# Patient Record
Sex: Female | Born: 1935 | Race: White | Hispanic: No | Marital: Single | Smoking: Never smoker
Health system: Southern US, Community
[De-identification: ages and names within clinical notes are randomized; demographics above are authoritative.]

## PROBLEM LIST (undated history)

## (undated) DIAGNOSIS — I10 Essential (primary) hypertension: Secondary | ICD-10-CM

## (undated) DIAGNOSIS — C50919 Malignant neoplasm of unspecified site of unspecified female breast: Secondary | ICD-10-CM

## (undated) DIAGNOSIS — E785 Hyperlipidemia, unspecified: Secondary | ICD-10-CM

## (undated) DIAGNOSIS — T7840XA Allergy, unspecified, initial encounter: Secondary | ICD-10-CM

## (undated) HISTORY — DX: Allergy, unspecified, initial encounter: T78.40XA

## (undated) HISTORY — PX: ANTERIOR AND POSTERIOR REPAIR: SHX1172

## (undated) HISTORY — PX: TONSILLECTOMY: SUR1361

## (undated) HISTORY — DX: Essential (primary) hypertension: I10

## (undated) HISTORY — DX: Malignant neoplasm of unspecified site of unspecified female breast: C50.919

## (undated) HISTORY — PX: HIP SURGERY: SHX245

## (undated) HISTORY — DX: Hyperlipidemia, unspecified: E78.5

---

## 1984-06-10 HISTORY — PX: ABDOMINAL HYSTERECTOMY: SHX81

## 2006-09-15 ENCOUNTER — Encounter: Payer: Self-pay | Admitting: Family Medicine

## 2006-09-15 ENCOUNTER — Ambulatory Visit: Payer: Self-pay | Admitting: Family Medicine

## 2006-09-15 DIAGNOSIS — E785 Hyperlipidemia, unspecified: Secondary | ICD-10-CM

## 2006-09-15 DIAGNOSIS — M810 Age-related osteoporosis without current pathological fracture: Secondary | ICD-10-CM | POA: Insufficient documentation

## 2006-09-15 DIAGNOSIS — Z9889 Other specified postprocedural states: Secondary | ICD-10-CM

## 2006-09-15 DIAGNOSIS — S72009A Fracture of unspecified part of neck of unspecified femur, initial encounter for closed fracture: Secondary | ICD-10-CM | POA: Insufficient documentation

## 2006-09-15 DIAGNOSIS — J309 Allergic rhinitis, unspecified: Secondary | ICD-10-CM | POA: Insufficient documentation

## 2006-09-15 DIAGNOSIS — I10 Essential (primary) hypertension: Secondary | ICD-10-CM | POA: Insufficient documentation

## 2006-09-15 DIAGNOSIS — S62609A Fracture of unspecified phalanx of unspecified finger, initial encounter for closed fracture: Secondary | ICD-10-CM

## 2006-09-15 DIAGNOSIS — S42309A Unspecified fracture of shaft of humerus, unspecified arm, initial encounter for closed fracture: Secondary | ICD-10-CM | POA: Insufficient documentation

## 2006-09-15 LAB — CONVERTED CEMR LAB
ALT: 14 units/L (ref 0–40)
Albumin: 4 g/dL (ref 3.5–5.2)
BUN: 21 mg/dL (ref 6–23)
Basophils Absolute: 0 10*3/uL (ref 0.0–0.1)
Basophils Relative: 0.3 % (ref 0.0–1.0)
CO2: 29 meq/L (ref 19–32)
Calcium: 9.4 mg/dL (ref 8.4–10.5)
Creatinine, Ser: 0.7 mg/dL (ref 0.4–1.2)
Eosinophils Absolute: 0.1 10*3/uL (ref 0.0–0.6)
Eosinophils Relative: 0.9 % (ref 0.0–5.0)
GFR calc Af Amer: 107 mL/min
GFR calc non Af Amer: 88 mL/min
Glucose, Bld: 128 mg/dL — ABNORMAL HIGH (ref 70–99)
HDL: 69.6 mg/dL (ref >39.0)
LDL Cholesterol: 90 mg/dL (ref 0–99)
Lymphocytes Relative: 29.8 % (ref 12.0–46.0)
MCHC: 34.8 g/dL (ref 30.0–36.0)
Platelets: 228 10*3/uL (ref 150–400)
Total CHOL/HDL Ratio: 2.6
Triglycerides: 111 mg/dL (ref 0–149)
WBC: 8.1 10*3/uL (ref 4.5–10.5)

## 2006-10-20 ENCOUNTER — Ambulatory Visit: Payer: Self-pay | Admitting: Family Medicine

## 2007-10-31 ENCOUNTER — Encounter: Payer: Self-pay | Admitting: Family Medicine

## 2008-01-01 ENCOUNTER — Ambulatory Visit: Payer: Self-pay | Admitting: Family Medicine

## 2008-01-01 ENCOUNTER — Encounter (INDEPENDENT_AMBULATORY_CARE_PROVIDER_SITE_OTHER): Payer: Self-pay | Admitting: *Deleted

## 2008-01-01 DIAGNOSIS — S93409A Sprain of unspecified ligament of unspecified ankle, initial encounter: Secondary | ICD-10-CM | POA: Insufficient documentation

## 2008-01-04 ENCOUNTER — Ambulatory Visit: Payer: Self-pay | Admitting: Family Medicine

## 2008-01-04 ENCOUNTER — Ambulatory Visit: Payer: Self-pay | Admitting: Internal Medicine

## 2008-01-05 ENCOUNTER — Encounter: Payer: Self-pay | Admitting: Family Medicine

## 2008-01-11 LAB — CONVERTED CEMR LAB
ALT: 16 units/L (ref 0–35)
AST: 19 units/L (ref 0–37)
BUN: 15 mg/dL (ref 6–23)
Bilirubin, Direct: 0.1 mg/dL (ref 0.0–0.3)
CO2: 27 meq/L (ref 19–32)
Creatinine, Ser: 0.7 mg/dL (ref 0.4–1.2)
HDL: 57.1 mg/dL (ref >39.0)
LDL Cholesterol: 91 mg/dL (ref 0–99)
Potassium: 3.9 meq/L (ref 3.5–5.1)
Total Bilirubin: 0.6 mg/dL (ref 0.3–1.2)
Total CHOL/HDL Ratio: 3
VLDL: 23 mg/dL (ref 0–40)

## 2008-01-12 ENCOUNTER — Encounter (INDEPENDENT_AMBULATORY_CARE_PROVIDER_SITE_OTHER): Payer: Self-pay | Admitting: *Deleted

## 2008-01-18 ENCOUNTER — Encounter (INDEPENDENT_AMBULATORY_CARE_PROVIDER_SITE_OTHER): Payer: Self-pay | Admitting: *Deleted

## 2008-01-29 ENCOUNTER — Encounter: Payer: Self-pay | Admitting: Family Medicine

## 2008-04-04 ENCOUNTER — Telehealth (INDEPENDENT_AMBULATORY_CARE_PROVIDER_SITE_OTHER): Payer: Self-pay | Admitting: *Deleted

## 2008-08-15 ENCOUNTER — Emergency Department (HOSPITAL_COMMUNITY): Admission: EM | Admit: 2008-08-15 | Discharge: 2008-08-15 | Payer: Self-pay | Admitting: Emergency Medicine

## 2009-08-05 ENCOUNTER — Emergency Department (HOSPITAL_BASED_OUTPATIENT_CLINIC_OR_DEPARTMENT_OTHER): Admission: EM | Admit: 2009-08-05 | Discharge: 2009-08-05 | Payer: Self-pay | Admitting: Emergency Medicine

## 2009-09-26 ENCOUNTER — Ambulatory Visit: Payer: Self-pay | Admitting: Radiology

## 2009-09-26 ENCOUNTER — Emergency Department (HOSPITAL_BASED_OUTPATIENT_CLINIC_OR_DEPARTMENT_OTHER): Admission: EM | Admit: 2009-09-26 | Discharge: 2009-09-26 | Payer: Self-pay | Admitting: Emergency Medicine

## 2009-09-26 ENCOUNTER — Encounter: Payer: Self-pay | Admitting: Family Medicine

## 2009-09-28 ENCOUNTER — Ambulatory Visit: Payer: Self-pay | Admitting: Family Medicine

## 2009-09-28 DIAGNOSIS — M545 Low back pain: Secondary | ICD-10-CM

## 2009-09-29 ENCOUNTER — Encounter: Admission: RE | Admit: 2009-09-29 | Discharge: 2009-09-29 | Payer: Self-pay | Admitting: Family Medicine

## 2009-10-02 ENCOUNTER — Ambulatory Visit: Payer: Self-pay | Admitting: Oncology

## 2009-10-02 ENCOUNTER — Telehealth: Payer: Self-pay | Admitting: Family Medicine

## 2009-10-02 ENCOUNTER — Encounter: Payer: Self-pay | Admitting: Family Medicine

## 2009-10-02 DIAGNOSIS — G9589 Other specified diseases of spinal cord: Secondary | ICD-10-CM

## 2009-10-02 LAB — CONVERTED CEMR LAB
Alkaline Phosphatase: 193 units/L — ABNORMAL HIGH (ref 39–117)
Basophils Relative: 0.4 % (ref 0.0–3.0)
Bilirubin, Direct: 0 mg/dL (ref 0.0–0.3)
HCT: 37 % (ref 36.0–46.0)
HDL: 67.8 mg/dL (ref >39.00)
Hemoglobin: 12.5 g/dL (ref 12.0–15.0)
LDL Cholesterol: 74 mg/dL (ref 0–99)
Lymphocytes Relative: 33.7 % (ref 12.0–46.0)
MCV: 93 fL (ref 78.0–100.0)
Monocytes Relative: 5 % (ref 3.0–12.0)
Neutro Abs: 5.8 10*3/uL (ref 1.4–7.7)
RBC: 3.98 M/uL (ref 3.87–5.11)
RDW: 13.6 % (ref 11.5–14.6)
Total CHOL/HDL Ratio: 2
Total Protein: 7.2 g/dL (ref 6.0–8.3)
Triglycerides: 105 mg/dL (ref 0.0–149.0)
VLDL: 21 mg/dL (ref 0.0–40.0)
Vit D, 25-Hydroxy: 65 ng/mL (ref 30–89)

## 2009-10-03 ENCOUNTER — Encounter (INDEPENDENT_AMBULATORY_CARE_PROVIDER_SITE_OTHER): Payer: Self-pay | Admitting: *Deleted

## 2009-10-03 ENCOUNTER — Ambulatory Visit: Payer: Self-pay | Admitting: Hematology & Oncology

## 2009-10-09 ENCOUNTER — Encounter: Payer: Self-pay | Admitting: Family Medicine

## 2009-10-09 LAB — CBC WITH DIFFERENTIAL (CANCER CENTER ONLY)
BASO%: 0.6 % (ref 0.0–2.0)
HCT: 34.6 % — ABNORMAL LOW (ref 34.8–46.6)
LYMPH%: 40.8 % (ref 14.0–48.0)
MCH: 30.4 pg (ref 26.0–34.0)
MCV: 90 fL (ref 81–101)
MONO#: 0.4 10*3/uL (ref 0.1–0.9)
MONO%: 4.3 % (ref 0.0–13.0)
NEUT%: 53.5 % (ref 39.6–80.0)
Platelets: 297 10*3/uL (ref 145–400)
RDW: 10.8 % (ref 10.5–14.6)
WBC: 9.5 10*3/uL (ref 3.9–10.0)

## 2009-10-09 LAB — CHCC SATELLITE - SMEAR

## 2009-10-10 ENCOUNTER — Ambulatory Visit: Payer: Self-pay | Admitting: Diagnostic Radiology

## 2009-10-10 ENCOUNTER — Ambulatory Visit (HOSPITAL_BASED_OUTPATIENT_CLINIC_OR_DEPARTMENT_OTHER): Admission: RE | Admit: 2009-10-10 | Discharge: 2009-10-10 | Payer: Self-pay | Admitting: Hematology & Oncology

## 2009-10-11 LAB — COMPREHENSIVE METABOLIC PANEL
AST: 21 U/L (ref 0–37)
BUN: 18 mg/dL (ref 6–23)
CO2: 25 mEq/L (ref 19–32)
Calcium: 9.2 mg/dL (ref 8.4–10.5)
Chloride: 102 mEq/L (ref 96–112)
Creatinine, Ser: 0.74 mg/dL (ref 0.40–1.20)
Total Bilirubin: 0.3 mg/dL (ref 0.3–1.2)

## 2009-10-11 LAB — BETA 2 MICROGLOBULIN, SERUM: Beta-2 Microglobulin: 2.36 mg/L — ABNORMAL HIGH (ref 1.01–1.73)

## 2009-10-11 LAB — SPEP & IFE WITH QIG
Alpha-1-Globulin: 13.9 % — ABNORMAL HIGH (ref 2.9–4.9)
Alpha-2-Globulin: 4.5 % — ABNORMAL LOW (ref 7.1–11.8)
Beta 2: 5.6 % (ref 3.2–6.5)
Beta Globulin: 6 % (ref 4.7–7.2)
Gamma Globulin: 12.1 % (ref 11.1–18.8)
IgG (Immunoglobin G), Serum: 868 mg/dL (ref 694–1618)

## 2009-10-11 LAB — KAPPA/LAMBDA LIGHT CHAINS
Kappa free light chain: 1.12 mg/dL (ref 0.33–1.94)
Kappa:Lambda Ratio: 1.11 (ref 0.26–1.65)

## 2009-10-16 ENCOUNTER — Encounter: Admission: RE | Admit: 2009-10-16 | Discharge: 2009-10-16 | Payer: Self-pay | Admitting: General Surgery

## 2009-10-16 ENCOUNTER — Encounter: Payer: Self-pay | Admitting: Family Medicine

## 2009-10-18 ENCOUNTER — Ambulatory Visit (HOSPITAL_COMMUNITY): Admission: RE | Admit: 2009-10-18 | Discharge: 2009-10-18 | Payer: Self-pay | Admitting: Hematology & Oncology

## 2009-10-20 ENCOUNTER — Encounter: Payer: Self-pay | Admitting: Family Medicine

## 2009-10-26 ENCOUNTER — Encounter: Payer: Self-pay | Admitting: Family Medicine

## 2009-11-03 ENCOUNTER — Ambulatory Visit: Payer: Self-pay | Admitting: Hematology & Oncology

## 2009-11-03 ENCOUNTER — Encounter: Payer: Self-pay | Admitting: Family Medicine

## 2009-11-20 ENCOUNTER — Encounter: Payer: Self-pay | Admitting: Family Medicine

## 2009-11-20 LAB — COMPREHENSIVE METABOLIC PANEL
ALT: 20 U/L (ref 0–35)
AST: 21 U/L (ref 0–37)
Alkaline Phosphatase: 164 U/L — ABNORMAL HIGH (ref 39–117)
Creatinine, Ser: 0.73 mg/dL (ref 0.40–1.20)
Sodium: 140 mEq/L (ref 135–145)
Total Bilirubin: 0.5 mg/dL (ref 0.3–1.2)
Total Protein: 6.7 g/dL (ref 6.0–8.3)

## 2009-12-14 ENCOUNTER — Ambulatory Visit: Payer: Self-pay | Admitting: Hematology & Oncology

## 2009-12-15 ENCOUNTER — Encounter: Payer: Self-pay | Admitting: Family Medicine

## 2009-12-15 LAB — CMP (CANCER CENTER ONLY)
ALT(SGPT): 22 U/L (ref 10–47)
Albumin: 4.4 g/dL (ref 3.3–5.5)
CO2: 31 mEq/L (ref 18–33)
Calcium: 9.6 mg/dL (ref 8.0–10.3)
Chloride: 99 mEq/L (ref 98–108)
Glucose, Bld: 108 mg/dL (ref 73–118)
Potassium: 3.8 mEq/L (ref 3.3–4.7)
Sodium: 138 mEq/L (ref 128–145)
Total Bilirubin: 0.6 mg/dl (ref 0.20–1.60)
Total Protein: 7.3 g/dL (ref 6.4–8.1)

## 2009-12-15 LAB — CBC WITH DIFFERENTIAL (CANCER CENTER ONLY)
BASO%: 0.9 % (ref 0.0–2.0)
Eosinophils Absolute: 0.1 10*3/uL (ref 0.0–0.5)
HCT: 41.1 % (ref 34.8–46.6)
LYMPH#: 4.3 10*3/uL — ABNORMAL HIGH (ref 0.9–3.3)
LYMPH%: 47.7 % (ref 14.0–48.0)
MCV: 92 fL (ref 81–101)
MONO#: 0.4 10*3/uL (ref 0.1–0.9)
Platelets: 236 10*3/uL (ref 145–400)
RBC: 4.47 10*6/uL (ref 3.70–5.32)
WBC: 9.1 10*3/uL (ref 3.9–10.0)

## 2009-12-15 LAB — CANCER ANTIGEN 27.29: CA 27.29: 424 U/mL — ABNORMAL HIGH (ref 0–39)

## 2010-01-12 ENCOUNTER — Encounter: Payer: Self-pay | Admitting: Family Medicine

## 2010-01-12 LAB — BASIC METABOLIC PANEL
BUN: 20 mg/dL (ref 6–23)
Calcium: 9.7 mg/dL (ref 8.4–10.5)
Glucose, Bld: 107 mg/dL — ABNORMAL HIGH (ref 70–99)
Potassium: 4.1 mEq/L (ref 3.5–5.3)
Sodium: 139 mEq/L (ref 135–145)

## 2010-01-16 ENCOUNTER — Ambulatory Visit: Payer: Self-pay | Admitting: Family Medicine

## 2010-01-18 ENCOUNTER — Ambulatory Visit: Payer: Self-pay | Admitting: Interventional Radiology

## 2010-01-18 ENCOUNTER — Ambulatory Visit (HOSPITAL_BASED_OUTPATIENT_CLINIC_OR_DEPARTMENT_OTHER): Admission: RE | Admit: 2010-01-18 | Discharge: 2010-01-18 | Payer: Self-pay | Admitting: Family Medicine

## 2010-01-19 ENCOUNTER — Telehealth (INDEPENDENT_AMBULATORY_CARE_PROVIDER_SITE_OTHER): Payer: Self-pay | Admitting: *Deleted

## 2010-01-19 ENCOUNTER — Ambulatory Visit: Payer: Self-pay | Admitting: Internal Medicine

## 2010-01-19 DIAGNOSIS — J189 Pneumonia, unspecified organism: Secondary | ICD-10-CM | POA: Insufficient documentation

## 2010-01-23 ENCOUNTER — Ambulatory Visit (HOSPITAL_BASED_OUTPATIENT_CLINIC_OR_DEPARTMENT_OTHER): Admission: RE | Admit: 2010-01-23 | Discharge: 2010-01-23 | Payer: Self-pay | Admitting: Internal Medicine

## 2010-01-23 ENCOUNTER — Ambulatory Visit: Payer: Self-pay | Admitting: Radiology

## 2010-01-24 ENCOUNTER — Encounter: Payer: Self-pay | Admitting: Internal Medicine

## 2010-01-25 ENCOUNTER — Ambulatory Visit: Payer: Self-pay | Admitting: Internal Medicine

## 2010-01-26 ENCOUNTER — Ambulatory Visit: Payer: Self-pay | Admitting: Diagnostic Radiology

## 2010-01-26 ENCOUNTER — Ambulatory Visit (HOSPITAL_BASED_OUTPATIENT_CLINIC_OR_DEPARTMENT_OTHER): Admission: RE | Admit: 2010-01-26 | Discharge: 2010-01-26 | Payer: Self-pay | Admitting: Internal Medicine

## 2010-01-26 LAB — CONVERTED CEMR LAB: BUN: 15 mg/dL (ref 6–23)

## 2010-01-31 ENCOUNTER — Ambulatory Visit (HOSPITAL_BASED_OUTPATIENT_CLINIC_OR_DEPARTMENT_OTHER): Admission: RE | Admit: 2010-01-31 | Discharge: 2010-01-31 | Payer: Self-pay | Admitting: Internal Medicine

## 2010-01-31 ENCOUNTER — Telehealth (INDEPENDENT_AMBULATORY_CARE_PROVIDER_SITE_OTHER): Payer: Self-pay | Admitting: *Deleted

## 2010-01-31 ENCOUNTER — Ambulatory Visit: Payer: Self-pay | Admitting: Diagnostic Radiology

## 2010-02-06 ENCOUNTER — Ambulatory Visit: Payer: Self-pay | Admitting: Hematology & Oncology

## 2010-02-07 ENCOUNTER — Encounter: Payer: Self-pay | Admitting: Family Medicine

## 2010-02-07 LAB — COMPREHENSIVE METABOLIC PANEL
CO2: 27 mEq/L (ref 19–32)
Calcium: 9.5 mg/dL (ref 8.4–10.5)
Chloride: 102 mEq/L (ref 96–112)
Creatinine, Ser: 0.64 mg/dL (ref 0.40–1.20)
Glucose, Bld: 106 mg/dL — ABNORMAL HIGH (ref 70–99)
Total Bilirubin: 0.5 mg/dL (ref 0.3–1.2)
Total Protein: 6.9 g/dL (ref 6.0–8.3)

## 2010-02-07 LAB — CANCER ANTIGEN 27.29: CA 27.29: 177 U/mL — ABNORMAL HIGH (ref 0–39)

## 2010-02-26 ENCOUNTER — Ambulatory Visit: Payer: Self-pay | Admitting: Diagnostic Radiology

## 2010-02-26 ENCOUNTER — Ambulatory Visit (HOSPITAL_BASED_OUTPATIENT_CLINIC_OR_DEPARTMENT_OTHER): Admission: RE | Admit: 2010-02-26 | Discharge: 2010-02-26 | Payer: Self-pay | Admitting: Hematology & Oncology

## 2010-03-02 ENCOUNTER — Ambulatory Visit (HOSPITAL_COMMUNITY): Admission: RE | Admit: 2010-03-02 | Discharge: 2010-03-02 | Payer: Self-pay | Admitting: Hematology & Oncology

## 2010-03-08 ENCOUNTER — Ambulatory Visit: Payer: Self-pay | Admitting: Hematology & Oncology

## 2010-03-09 ENCOUNTER — Encounter: Payer: Self-pay | Admitting: Family Medicine

## 2010-03-09 LAB — CBC WITH DIFFERENTIAL (CANCER CENTER ONLY)
BASO#: 0.1 10*3/uL (ref 0.0–0.2)
BASO%: 1 % (ref 0.0–2.0)
Eosinophils Absolute: 0.1 10*3/uL (ref 0.0–0.5)
MCH: 31.6 pg (ref 26.0–34.0)
MCV: 93 fL (ref 81–101)
NEUT#: 4.5 10*3/uL (ref 1.5–6.5)
NEUT%: 48.7 % (ref 39.6–80.0)
RBC: 4.28 10*6/uL (ref 3.70–5.32)
RDW: 10.8 % (ref 10.5–14.6)

## 2010-03-09 LAB — CMP (CANCER CENTER ONLY)
ALT(SGPT): 17 U/L (ref 10–47)
Albumin: 4.3 g/dL (ref 3.3–5.5)
Alkaline Phosphatase: 83 U/L (ref 26–84)
CO2: 27 mEq/L (ref 18–33)
Potassium: 3.9 mEq/L (ref 3.3–4.7)
Sodium: 135 mEq/L (ref 128–145)
Total Bilirubin: 0.7 mg/dl (ref 0.20–1.60)

## 2010-03-09 LAB — CANCER ANTIGEN 27.29: CA 27.29: 121 U/mL — ABNORMAL HIGH (ref 0–39)

## 2010-04-05 LAB — CBC WITH DIFFERENTIAL (CANCER CENTER ONLY)
BASO%: 0.7 % (ref 0.0–2.0)
EOS%: 0.9 % (ref 0.0–7.0)
HGB: 13.2 g/dL (ref 11.6–15.9)
MCH: 31.8 pg (ref 26.0–34.0)
MCHC: 34.4 g/dL (ref 32.0–36.0)
MCV: 93 fL (ref 81–101)
MONO#: 0.4 10*3/uL (ref 0.1–0.9)
MONO%: 5 % (ref 0.0–13.0)
NEUT#: 4.4 10*3/uL (ref 1.5–6.5)
RDW: 10.7 % (ref 10.5–14.6)

## 2010-04-05 LAB — COMPREHENSIVE METABOLIC PANEL
ALT: 15 U/L (ref 0–35)
AST: 22 U/L (ref 0–37)
Albumin: 4.2 g/dL (ref 3.5–5.2)
Alkaline Phosphatase: 55 U/L (ref 39–117)
Sodium: 135 mEq/L (ref 135–145)
Total Protein: 6.3 g/dL (ref 6.0–8.3)

## 2010-04-05 LAB — CANCER ANTIGEN 27.29: CA 27.29: 88 U/mL — ABNORMAL HIGH (ref 0–39)

## 2010-05-02 ENCOUNTER — Ambulatory Visit (HOSPITAL_BASED_OUTPATIENT_CLINIC_OR_DEPARTMENT_OTHER): Admission: RE | Admit: 2010-05-02 | Discharge: 2010-05-02 | Payer: Self-pay | Admitting: Internal Medicine

## 2010-05-02 ENCOUNTER — Ambulatory Visit: Payer: Self-pay | Admitting: Diagnostic Radiology

## 2010-05-02 ENCOUNTER — Ambulatory Visit: Payer: Self-pay | Admitting: Family

## 2010-05-02 DIAGNOSIS — R05 Cough: Secondary | ICD-10-CM

## 2010-05-02 DIAGNOSIS — C50919 Malignant neoplasm of unspecified site of unspecified female breast: Secondary | ICD-10-CM | POA: Insufficient documentation

## 2010-05-02 DIAGNOSIS — J329 Chronic sinusitis, unspecified: Secondary | ICD-10-CM | POA: Insufficient documentation

## 2010-05-09 ENCOUNTER — Telehealth (INDEPENDENT_AMBULATORY_CARE_PROVIDER_SITE_OTHER): Payer: Self-pay | Admitting: *Deleted

## 2010-05-09 ENCOUNTER — Ambulatory Visit: Payer: Self-pay | Admitting: Hematology & Oncology

## 2010-05-10 ENCOUNTER — Encounter: Payer: Self-pay | Admitting: Family Medicine

## 2010-05-10 LAB — CBC WITH DIFFERENTIAL (CANCER CENTER ONLY)
BASO#: 0.1 10*3/uL (ref 0.0–0.2)
Eosinophils Absolute: 0.1 10*3/uL (ref 0.0–0.5)
HCT: 38.6 % (ref 34.8–46.6)
HGB: 12.9 g/dL (ref 11.6–15.9)
LYMPH#: 4.2 10*3/uL — ABNORMAL HIGH (ref 0.9–3.3)
LYMPH%: 41.7 % (ref 14.0–48.0)
MCV: 92 fL (ref 81–101)
MONO#: 0.4 10*3/uL (ref 0.1–0.9)
NEUT%: 53.2 % (ref 39.6–80.0)
RBC: 4.2 10*6/uL (ref 3.70–5.32)
WBC: 10.2 10*3/uL — ABNORMAL HIGH (ref 3.9–10.0)

## 2010-05-10 LAB — CMP (CANCER CENTER ONLY)
CO2: 27 mEq/L (ref 18–33)
Calcium: 9.3 mg/dL (ref 8.0–10.3)
Chloride: 108 mEq/L (ref 98–108)
Creat: 0.5 mg/dl — ABNORMAL LOW (ref 0.6–1.2)
Glucose, Bld: 232 mg/dL — ABNORMAL HIGH (ref 73–118)
Total Bilirubin: 0.7 mg/dl (ref 0.20–1.60)

## 2010-05-10 LAB — CANCER ANTIGEN 27.29: CA 27.29: 78 U/mL — ABNORMAL HIGH (ref 0–39)

## 2010-05-15 ENCOUNTER — Ambulatory Visit: Payer: Self-pay | Admitting: Family Medicine

## 2010-06-14 ENCOUNTER — Ambulatory Visit: Payer: Self-pay | Admitting: Hematology & Oncology

## 2010-06-15 ENCOUNTER — Encounter: Payer: Self-pay | Admitting: Family Medicine

## 2010-06-15 LAB — COMPREHENSIVE METABOLIC PANEL
ALT: 20 U/L (ref 0–35)
AST: 22 U/L (ref 0–37)
Albumin: 5.1 g/dL (ref 3.5–5.2)
Alkaline Phosphatase: 70 U/L (ref 39–117)
BUN: 15 mg/dL (ref 6–23)
CO2: 22 mEq/L (ref 19–32)
Calcium: 9.7 mg/dL (ref 8.4–10.5)
Chloride: 102 mEq/L (ref 96–112)
Creatinine, Ser: 0.72 mg/dL (ref 0.40–1.20)
Glucose, Bld: 107 mg/dL — ABNORMAL HIGH (ref 70–99)
Potassium: 3.8 mEq/L (ref 3.5–5.3)
Sodium: 138 mEq/L (ref 135–145)
Total Bilirubin: 0.6 mg/dL (ref 0.3–1.2)
Total Protein: 7.4 g/dL (ref 6.0–8.3)

## 2010-06-15 LAB — CANCER ANTIGEN 27.29: CA 27.29: 76 U/mL — ABNORMAL HIGH (ref 0–39)

## 2010-07-01 ENCOUNTER — Encounter: Payer: Self-pay | Admitting: Hematology & Oncology

## 2010-07-05 ENCOUNTER — Ambulatory Visit
Admission: RE | Admit: 2010-07-05 | Discharge: 2010-07-05 | Payer: Self-pay | Source: Home / Self Care | Attending: Family Medicine | Admitting: Family Medicine

## 2010-07-05 DIAGNOSIS — J069 Acute upper respiratory infection, unspecified: Secondary | ICD-10-CM | POA: Insufficient documentation

## 2010-07-10 NOTE — Letter (Signed)
[facility]   Imported By: Lanelle Bal 12/14/2009 10:58:46  _____________________________________________________________________  External Attachment:    Type:   Image     Comment:   External Document

## 2010-07-10 NOTE — Letter (Signed)
[facility]   Imported By: Lanelle Bal 01/05/2010 09:54:11  _____________________________________________________________________  External Attachment:    Type:   Image     Comment:   External Document

## 2010-07-10 NOTE — Progress Notes (Signed)
Summary: MRI- Review please  Phone Note Call from Patient   Caller: [facility] Summary of Call: Please Review MRI results. [facility] called this a.m. to give [place] the call report. Army Fossa CMA  October 02, 2009 8:20 AM   Follow-up for Phone Call        see MRI--  attempted to call pt--msg left to call back Follow-up by: Loreen Freud DO,  October 02, 2009 8:56 AM  Additional Follow-up for Phone Call Additional follow up Details #1::        Tried calling pt again no answer.Left message Army Fossa CMA  October 02, 2009 12:05 PM  Labs- alk phos is elevated---repeat alk phos fractionated 790.4 rest of labs normal---recheck 6 months---lipid, hep, bmp  272.4 Signed by Loreen Freud DO on 09/28/2009 at 5:02 PM    Additional Follow-up for Phone Call Additional follow up Details #2::    Left message on work number. Army Fossa CMA  October 02, 2009 1:10 PM  Left message on home and work number. Army Fossa CMA  October 03, 2009 1:56 PM   Additional Follow-up for Phone Call Additional follow up Details #3:: Details for Additional Follow-up Action Taken: Attempted to call pt several times, no response. Per Renee Dr[facility] office was able to get intouch with her at 11 am today she has an appt on May 2nd. Per Dr. Laury Axon mail pt a letter to call back. Army Fossa CMA  October 03, 2009 2:27 PM

## 2010-07-10 NOTE — Letter (Signed)
[facility]   Imported By: Lanelle Bal 02/06/2010 09:46:09  _____________________________________________________________________  External Attachment:    Type:   Image     Comment:   External Document

## 2010-07-10 NOTE — Letter (Signed)
Summary: [facility]   Imported By: Lanelle Bal 11/11/2009 11:12:43  _____________________________________________________________________  External Attachment:    Type:   Image     Comment:   External Document

## 2010-07-10 NOTE — Letter (Signed)
[facility]   Imported By: Lanelle Bal 12/19/2009 11:56:57  _____________________________________________________________________  External Attachment:    Type:   Image     Comment:   External Document

## 2010-07-10 NOTE — Progress Notes (Signed)
Summary: Order Request  Phone Note From Other Clinic Call back at [phone]   Caller: [facility], Eber Jones Summary of Call: Patient walked in for Chest Xray and they need an order  I reviewed chart and placed order and faxed to [phone]./[facility] CMA  January 31, 2010 2:57 PM

## 2010-07-10 NOTE — Letter (Signed)
Summary: Unable To Reach-Consult Scheduled  [facility] [place]  [address] [place], [place] [address]   Phone: [phone]  Fax: [phone]    10/03/2009 MRN: [id number]    Dear Ms. Coots,   We have been unable to reach you by phone.  Please contact our office with an updated phone number.      Thank you,  Army Fossa CMA  October 03, 2009 2:28 PM

## 2010-07-10 NOTE — Assessment & Plan Note (Signed)
Summary: + lingular pneumonia, still no better//fd   Vital Signs:  Patient profile:   75 year old female Weight:      117 pounds O2 Sat:      95 % on RA Temp:     97.9 degrees F oral Pulse rate:   73 / minute Resp:     15 per minute BP sitting:   132 / 78  (left arm) Cuff size:   regular  Vitals Entered By: Shonna Chock CMA (January 19, 2010 2:14 PM)  O2 Flow:  RA CC: Ongoing pneumonia-?, refill flonase   Primary Care Provider:  Laury Axon  CC:  Ongoing pneumonia-? and refill flonase.  History of Present Illness: She remains tired with intermittent chills  & sweats w/o fever ; CXray revealed lingular PNA.She has  metastatic breast cancer involving  bones & is followed by Dr Myna Hidalgo.No PMH of pulmonary; she quit smoking 1950.  Current Medications (verified): 1)  Zestoretic 10-12.5 Mg  Tabs (Lisinopril-Hydrochlorothiazide) .Marland Kitchen.. 1 A Day 2)  Simvastatin 20 Mg  Tabs (Simvastatin) .Marland Kitchen.. 1 A Day 3)  Flonase 50 Mcg/act  Susp (Fluticasone Propionate) .Marland Kitchen.. 1 Spray Each Nostril Prn 4)  Baby Asa .Marland KitchenMarland Kitchen. 1 A Day 5)  Vit A 1000mg  .... 1 A Day 6)  Vit D 500iu .Marland KitchenMarland Kitchen. 1 A Day 7)  Hydrocodone-Acetaminophen 10-325 Mg Tabs (Hydrocodone-Acetaminophen) .... Take 1 Tab Every 6 Hours As Needed 8)  Femara 2.5 Mg Tabs (Letrozole) .... Take 1 Tab Once Daily 9)  Allegra 180 Mg Tabs (Fexofenadine Hcl) .Marland Kitchen.. 1 By Mouth Once Daily 10)  Zithromax Z-Pak 250 Mg Tabs (Azithromycin) .... As Directed 11)  Mucinex Dm 30-600 Mg Xr12h-Tab (Dextromethorphan-Guaifenesin) 12)  Qvar 40 Mcg/act Aers (Beclomethasone Dipropionate) .... 2puffs Two Times A Day  Allergies (verified): No Known Drug Allergies  Review of Systems ENT:  Complains of hoarseness; denies ear discharge, earache, and sore throat; No frontal headache , facial pain or purulence. CV:  Denies chest pain or discomfort, difficulty breathing at night, difficulty breathing while lying down, swelling of feet, and swelling of hands. Resp:  Complains of wheezing;  denies chest pain with inspiration, coughing up blood, shortness of breath, and sputum productive.  Physical Exam  General:  in no acute distress; alert,appropriate and cooperative throughout examination, appears younger than age Lungs:  Normal respiratory effort, chest expands symmetrically. Lungs are clear to auscultation, no crackles or wheezes. Heart:  normal rate, regular rhythm, no gallop, no rub, no JVD, and grade 1/2-1  /6 systolic murmur LUSB.   Skin:  Tanned; skin slightly damp Cervical Nodes:  No lymphadenopathy noted Axillary Nodes:  No palpable lymphadenopathy Psych:  memory intact for recent and remote, normally interactive, good eye contact, not anxious appearing, and not depressed appearing.   Matter of factly stated " terminal cancer patient"   Impression & Recommendations:  Problem # 1:  PNEUMONIA (ICD-486)  The following medications were removed from the medication list:    Zithromax Z-pak 250 Mg Tabs (Azithromycin) .Marland Kitchen... As directed Her updated medication list for this problem includes:    Avelox Abc Pack 400 Mg Tabs (Moxifloxacin hcl) .Marland Kitchen... 1 pill once daily  Orders: T-2 View CXR (71020TC)  Complete Medication List: 1)  Zestoretic 10-12.5 Mg Tabs (Lisinopril-hydrochlorothiazide) .Marland Kitchen.. 1 a day 2)  Simvastatin 20 Mg Tabs (Simvastatin) .Marland Kitchen.. 1 a day 3)  Flonase 50 Mcg/act Susp (Fluticasone propionate) .Marland Kitchen.. 1 spray each nostril prn 4)  Baby Asa  .Marland Kitchen.. 1 a day 5)  Vit A 1000mg   .Marland KitchenMarland KitchenMarland Kitchen  1 a day 6)  Vit D 500iu  .Marland Kitchen.. 1 a day 7)  Hydrocodone-acetaminophen 10-325 Mg Tabs (Hydrocodone-acetaminophen) .... Take 1 tab every 6 hours as needed 8)  Femara 2.5 Mg Tabs (Letrozole) .... Take 1 tab once daily 9)  Allegra 180 Mg Tabs (Fexofenadine hcl) .Marland Kitchen.. 1 by mouth once daily 10)  Mucinex Dm 30-600 Mg Xr12h-tab (Dextromethorphan-guaifenesin) 11)  Qvar 40 Mcg/act Aers (Beclomethasone dipropionate) .... 2puffs two times a day 12)  Avelox Abc Pack 400 Mg Tabs (Moxifloxacin hcl) .Marland Kitchen.. 1  pill once daily  Patient Instructions: 1)  Blow up 10 balloons  / day. To ER WITH THIS NOTE  if having dyspnea (SOB), chest pain or high fevers as discussed.Drink as much fluid as you can tolerate for the next few days. CXray Tues 01/23/2010. Prescriptions: AVELOX ABC PACK 400 MG TABS (MOXIFLOXACIN HCL) 1 pill once daily  #5 x 0   Entered and Authorized by:   Marga Melnick MD   Signed by:   Marga Melnick MD on 01/19/2010   Method used:   Samples Given   RxID:   [id number]

## 2010-07-10 NOTE — Assessment & Plan Note (Signed)
Summary: tickle /throat/cbs   Vital Signs:  Patient profile:   75 year old female Height:      59.75 inches Weight:      116 pounds O2 Sat:      98 % on Room air Temp:     98.5 degrees F oral Pulse rate:   83 / minute BP sitting:   130 / 78  (left arm)  Vitals Entered By: Jeremy Johann CMA (January 16, 2010 3:50 PM)  O2 Flow:  Room air CC: WHEEZING, CONGESTION, COUGH, URI symptoms   History of Present Illness:       This is a 75 year old woman who presents with URI symptoms.  The symptoms began 6 days ago.  The patient denies nasal congestion, clear nasal discharge, purulent nasal discharge, sore throat, dry cough, productive cough, earache, and sick contacts.  Associated symptoms include wheezing.  The patient denies fever, low-grade fever (<100.5 degrees), fever of 100.5-103 degrees, fever of 103.1-104 degrees, fever to >104 degrees, stiff neck, dyspnea, rash, vomiting, diarrhea, use of an antipyretic, and response to antipyretic.  The patient also reports itchy throat.  The patient denies itchy watery eyes, sneezing, seasonal symptoms, response to antihistamine, headache, muscle aches, and severe fatigue.  The patient denies the following risk factors for Strep sinusitis: unilateral facial pain, unilateral nasal discharge, poor response to decongestant, double sickening, tooth pain, Strep exposure, tender adenopathy, and absence of cough.    Current Medications (verified): 1)  Zestoretic 10-12.5 Mg  Tabs (Lisinopril-Hydrochlorothiazide) .Marland Kitchen.. 1 A Day 2)  Simvastatin 20 Mg  Tabs (Simvastatin) .Marland Kitchen.. 1 A Day 3)  Flonase 50 Mcg/act  Susp (Fluticasone Propionate) .Marland Kitchen.. 1 Spray Each Nostril Prn 4)  Baby Asa .Marland KitchenMarland Kitchen. 1 A Day 5)  Vit A 1000mg  .... 1 A Day 6)  Vit D 1000mg  .... 1 A Day 7)  Gingo Biloba .Marland KitchenMarland Kitchen. 1 A Day 8)  Flexeril 10 Mg Tabs (Cyclobenzaprine Hcl) .Marland Kitchen.. 1 By Mouth Every 6-8 Hrs. 9)  Hydrocodone-Acetaminophen 10-325 Mg Tabs (Hydrocodone-Acetaminophen) .... Take 1 Tab Every 6 Hours As  Needed 10)  Femara 2.5 Mg Tabs (Letrozole) .... Take 1 Tab Once Daily 11)  Allegra 180 Mg Tabs (Fexofenadine Hcl) .Marland Kitchen.. 1 By Mouth Once Daily 12)  Zithromax Z-Pak 250 Mg Tabs (Azithromycin) .... As Directed 13)  Mucinex Dm 30-600 Mg Xr12h-Tab (Dextromethorphan-Guaifenesin) 14)  Qvar 40 Mcg/act Aers (Beclomethasone Dipropionate) .... 2puffs Two Times A Day  Allergies (verified): No Known Drug Allergies  Past History:  Past medical, surgical, family and social histories (including risk factors) reviewed for relevance to current acute and chronic problems.  Past Medical History: Reviewed history from 09/15/2006 and no changes required. Allergic rhinitis Hyperlipidemia Hypertension Osteoporosis  Past Surgical History: Reviewed history from 09/15/2006 and no changes required. Hysterectomy1986 Tonsillectomy  Family History: Reviewed history from 09/15/2006 and no changes required. Family History of CAD Female 1st degree relative <50 cancer M and B? etiology Family History of Arthritis Family History Depression  Social History: Reviewed history from 09/15/2006 and no changes required. 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  Otoscopic examination reveals clear canals, tympanic membranes are intact bilaterally without bulging, retraction, inflammation or discharge. Hearing is grossly normal bilaterally. Nose:  External nasal examination shows no deformity or inflammation. Nasal mucosa are pink and  moist without lesions or exudates. Mouth:  pharyngeal erythema and postnasal drip.   Neck:  No deformities, masses, or tenderness noted. Lungs:  normal respiratory effort, R wheezes, and L wheezes.   Heart:  normal rate and no murmur.   Psych:  Oriented X3  and normally interactive.     Impression & Recommendations:  Problem # 1:  BRONCHITIS- ACUTE (ICD-466.0)  The following medications were removed from the medication list:    Keflex 500 Mg Caps (Cephalexin) .Marland Kitchen... 1 by mouth qid for 7days. Her updated medication list for this problem includes:    Zithromax Z-pak 250 Mg Tabs (Azithromycin) .Marland Kitchen... As directed    Mucinex Dm 30-600 Mg Xr12h-tab (Dextromethorphan-guaifenesin)    Qvar 40 Mcg/act Aers (Beclomethasone dipropionate) .Marland Kitchen... 2puffs two times a day     allegra as needed   Orders: T-2 View CXR (71020TC) Prescription Created Electronically [phone])  Take antibiotics and other medications as directed. Encouraged to push clear liquids, get enough rest, and take acetaminophen as needed. To be seen in 5-7 days if no improvement, sooner if worse.  Complete Medication List: 1)  Zestoretic 10-12.5 Mg Tabs (Lisinopril-hydrochlorothiazide) .Marland Kitchen.. 1 a day 2)  Simvastatin 20 Mg Tabs (Simvastatin) .Marland Kitchen.. 1 a day 3)  Flonase 50 Mcg/act Susp (Fluticasone propionate) .Marland Kitchen.. 1 spray each nostril prn 4)  Baby Asa  .Marland Kitchen.. 1 a day 5)  Vit A 1000mg   .... 1 a day 6)  Vit D 1000mg   .... 1 a day 7)  Gingo Biloba  .Marland Kitchen.. 1 a day 8)  Flexeril 10 Mg Tabs (Cyclobenzaprine hcl) .Marland Kitchen.. 1 by mouth every 6-8 hrs. 9)  Hydrocodone-acetaminophen 10-325 Mg Tabs (Hydrocodone-acetaminophen) .... Take 1 tab every 6 hours as needed 10)  Femara 2.5 Mg Tabs (Letrozole) .... Take 1 tab once daily 11)  Allegra 180 Mg Tabs (Fexofenadine hcl) .Marland Kitchen.. 1 by mouth once daily 12)  Zithromax Z-pak 250 Mg Tabs (Azithromycin) .... As directed 13)  Mucinex Dm 30-600 Mg Xr12h-tab (Dextromethorphan-guaifenesin) 14)  Qvar 40 Mcg/act Aers (Beclomethasone dipropionate) .... 2puffs two times a day Prescriptions: ZITHROMAX Z-PAK 250 MG TABS (AZITHROMYCIN) as directed  #1 x 0   Entered and Authorized by:   Loreen Freud DO   Signed by:   Loreen Freud DO on 01/16/2010   Method used:   Electronically to         [facility] #[id number]* (retail)       [address]       [place], [place]  [address]       Ph: [phone]       Fax: [phone]   RxID:   [id number] ALLEGRA 180 MG TABS (FEXOFENADINE HCL) 1 by mouth once daily  #30 x 11   Entered and Authorized by:   Loreen Freud DO   Signed by:   Loreen Freud DO on 01/16/2010   Method used:   Electronically to        [facility] #[id number]* (retail)       [address]       [place], [place]  [address]       Ph: [phone]       Fax: [phone]   RxID:   [id number]

## 2010-07-10 NOTE — Assessment & Plan Note (Signed)
Summary: fu to urgent care/kdc   Vital Signs:  Patient profile:   75 year old female Height:      59.75 inches Weight:      121.50 pounds BMI:     24.01 Pulse rate:   86 / minute Pulse rhythm:   regular BP sitting:   110 / 72  (left arm) Cuff size:   regular  Vitals Entered By: Army Fossa CMA (September 28, 2009 9:54 AM) CC: Pt here for upper and lower back pain- went to [facility] on Tuesday., Back Pain   History of Present Illness:       This is a 75 year old woman who presents with Back Pain.  The symptoms began 6 days ago.  Pt here c/o low back pain that started at home on Friday.   No known injury.  Pt went to [facility]  09/26/2009.  The patient denies fever, chills, weakness, loss of sensation, fecal incontinence, urinary incontinence, urinary retention, dysuria, rest pain, inability to work, and inability to care for self.  The pain is located in the right low back.  The pain began at home and gradually.  The pain radiates to the right buttock.  The pain is made worse by lying down and activity.  The pain is made better by inactivity and heat.  Pt given norco, flexeril and Motrin rxs from ER----No relief.  xray--showed DDD L5S1 with disc space narrowing.    Current Medications (verified): 1)  Fosamax 70 Mg  Tabs (Alendronate Sodium) .Marland Kitchen.. 1 A Week 2)  Zestoretic 10-12.5 Mg  Tabs (Lisinopril-Hydrochlorothiazide) .Marland Kitchen.. 1 A Day 3)  Simvastatin 20 Mg  Tabs (Simvastatin) .Marland Kitchen.. 1 A Day 4)  Fexofenadine Hcl 180 Mg  Tabs (Fexofenadine Hcl) .Marland Kitchen.. 1 A Day 5)  Flonase 50 Mcg/act  Susp (Fluticasone Propionate) .Marland Kitchen.. 1 Spray Each Nostril Prn 6)  Baby Asa .Marland KitchenMarland Kitchen. 1 A Day 7)  Vit A 1000mg  .... 1 A Day 8)  Vit D 1000mg  .... 1 A Day 9)  Gingo Biloba .Marland KitchenMarland Kitchen. 1 A Day 10)  Motrin Ib 200 Mg Tabs (Ibuprofen) .Marland Kitchen.. 1 By Mouth Three Times A Day 11)  Flexeril 10 Mg Tabs (Cyclobenzaprine Hcl) .Marland Kitchen.. 1 By Mouth Every 6-8 Hrs. 12)  Keflex 500 Mg Caps (Cephalexin) .Marland Kitchen.. 1 By Mouth Qid For 7days. 13)   Norco 5-325 Mg Tabs (Hydrocodone-Acetaminophen) .... Take 1-2 Tablets Every 4-6 Hrs As Needed For Pain.  Allergies (verified): No Known Drug Allergies  Past History:  Past medical, surgical, family and social histories (including risk factors) reviewed for relevance to current acute and chronic problems.  Past Medical History: Reviewed history from 09/15/2006 and no changes required. Allergic rhinitis Hyperlipidemia Hypertension Osteoporosis  Past Surgical History: Reviewed history from 09/15/2006 and no changes required. Hysterectomy1986 Tonsillectomy  Family History: Reviewed history from 09/15/2006 and no changes required. Family History of CAD Female 1st degree relative <50 cancer M and B? etiology Family History of Arthritis Family History Depression  Social History: Reviewed history from 09/15/2006 and no changes required. 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   Extremities:  No clubbing, cyanosis, edema, or deformity noted with normal full range of motion of all joints.   Neurologic:  alert & oriented X3 and DTRs symmetrical and normal.  Pt with good strength in both lower ext R  toe weak--dorsi / plantar flexion Psych:  Oriented X3 and normally interactive.     Impression & Recommendations:  Problem # 1:  LOW BACK PAIN, ACUTE (ICD-724.2)  The following medications were removed from the medication list:    Vicodin Es 7.5-750 Mg Tabs (Hydrocodone-acetaminophen) .Marland Kitchen... 1 by mouth every 6 hours prn Her updated medication list for this problem includes:    Motrin Ib 200 Mg Tabs (Ibuprofen) .Marland Kitchen... 1 by mouth three times a day    Flexeril 10 Mg Tabs (Cyclobenzaprine hcl) .Marland Kitchen... 1 by mouth every 6-8 hrs.    Norco 5-325 Mg Tabs (Hydrocodone-acetaminophen) .Marland Kitchen... Take 1-2 tablets every 4-6  hrs as needed for pain.  Orders: Radiology Referral (Radiology) Orthopedic Surgeon Referral (Ortho Surgeon)  Discussed use of moist heat or ice, modified activities, medications, and stretching/strengthening exercises. Back care instructions given. To be seen in 2 weeks if no improvement; sooner if worsening of symptoms.   Complete Medication List: 1)  Fosamax 70 Mg Tabs (Alendronate sodium) .Marland Kitchen.. 1 a week 2)  Zestoretic 10-12.5 Mg Tabs (Lisinopril-hydrochlorothiazide) .Marland Kitchen.. 1 a day 3)  Simvastatin 20 Mg Tabs (Simvastatin) .Marland Kitchen.. 1 a day 4)  Fexofenadine Hcl 180 Mg Tabs (Fexofenadine hcl) .Marland Kitchen.. 1 a day 5)  Flonase 50 Mcg/act Susp (Fluticasone propionate) .Marland Kitchen.. 1 spray each nostril prn 6)  Baby Asa  .Marland Kitchen.. 1 a day 7)  Vit A 1000mg   .... 1 a day 8)  Vit D 1000mg   .... 1 a day 9)  Gingo Biloba  .Marland Kitchen.. 1 a day 10)  Motrin Ib 200 Mg Tabs (Ibuprofen) .Marland Kitchen.. 1 by mouth three times a day 11)  Flexeril 10 Mg Tabs (Cyclobenzaprine hcl) .Marland Kitchen.. 1 by mouth every 6-8 hrs. 12)  Keflex 500 Mg Caps (Cephalexin) .Marland Kitchen.. 1 by mouth qid for 7days. 13)  Norco 5-325 Mg Tabs (Hydrocodone-acetaminophen) .... Take 1-2 tablets every 4-6 hrs as needed for pain.  Other Orders: Venipuncture ([address]) TLB-Lipid Panel (80061-LIPID) TLB-BMP (Basic Metabolic Panel-BMET) (80048-METABOL) TLB-CBC Platelet - w/Differential (85025-CBCD) TLB-Hepatic/Liver Function Pnl (80076-HEPATIC) T-Vitamin D (25-Hydroxy) ([address]) Prescriptions: FEXOFENADINE HCL 180 MG  TABS (FEXOFENADINE HCL) 1 a day  #30 x 11   Entered and Authorized by:   Loreen Freud DO   Signed by:   Loreen Freud DO on 09/28/2009   Method used:   Historical   RxID:   [id number] FLEXERIL 10 MG TABS (CYCLOBENZAPRINE HCL) 1 by mouth every 6-8 hrs.  #60 x 0   Entered and Authorized by:   Loreen Freud DO   Signed by:   Loreen Freud DO on 09/28/2009   Method used:   Print then Give to Patient   RxID:   [id number] FEXOFENADINE HCL 180 MG  TABS (FEXOFENADINE HCL) 1  a day  #30 x 60   Entered and Authorized by:   Loreen Freud DO   Signed by:   Loreen Freud DO on 09/28/2009   Method used:   Print then Give to Patient   RxID:   [id number] NORCO 5-325 MG TABS (HYDROCODONE-ACETAMINOPHEN) take 1-2 tablets every 4-6 hrs as needed for pain.  #60 x 0   Entered and Authorized by:   Loreen Freud DO   Signed by:   Loreen Freud DO on 09/28/2009   Method used:   Print then Give to Patient   RxID:   [id number]

## 2010-07-10 NOTE — Progress Notes (Signed)
Summary: Not feeling better  Phone Note Call from Patient Call back at Home Phone [phone]   Summary of Call: Patient left message on triage that she was seen last week by Dr. Peggyann Juba and would like Hop to review her CXR. She notes that she is not feeling better. Please advise. Initial call taken by: [name] CMA,  May 09, 2010 12:10 PM  Follow-up for Phone Call        Patient called again regarding the above, she is at work but we can leave a message at the number above. [name] CMA  May 09, 2010 2:31 PM   Additional Follow-up for Phone Call Additional follow up Details #1::        biaxin xl  2 by mouth once daily for 14 days ---  ov next week  Additional Follow-up by: Loreen Freud DO,  May 10, 2010 11:17 AM    Additional Follow-up for Phone Call Additional follow up Details #2::    Left message to call back.... Almeta Monas CMA Duncan Dull)  May 10, 2010 11:36 AM   Additional Follow-up for Phone Call Additional follow up Details #3:: Details for Additional Follow-up Action Taken: pt aware appointment schedule, rx sent to pharmacy..........Marland KitchenFelecia Deloach CMA  May 10, 2010 12:36 PM   New/Updated Medications: BIAXIN XL 500 MG XR24H-TAB (CLARITHROMYCIN) Take 2 by mouth once daily for 14 days Prescriptions: BIAXIN XL 500 MG XR24H-TAB (CLARITHROMYCIN) Take 2 by mouth once daily for 14 days  #28 x 0   Entered by:   Jeremy Johann CMA   Authorized by:   Loreen Freud DO   Signed by:   Jeremy Johann CMA on 05/10/2010   Method used:   Faxed to ...       [facility] #[id number]* (retail)       [address]       [place], [place]  [address]       Ph: [phone]       Fax: [phone]   RxID:   [id number]

## 2010-07-10 NOTE — Assessment & Plan Note (Signed)
Summary: f/u congestion//fd   Vital Signs:  Patient profile:   75 year old female Weight:      117.8 pounds O2 Sat:      99 % on Room air Temp:     97.2 degrees F oral Pulse rate:   83 / minute Pulse rhythm:   regular BP sitting:   130 / 82  (right arm) Cuff size:   regular  Vitals Entered By: Almeta Monas CMA Duncan Dull) (May 15, 2010 9:52 AM)  O2 Flow:  Room air CC: f/u-- still having congestion, cough and nasal drainage, URI symptoms   History of Present Illness:       This is a 75 year old woman who presents with URI symptoms.  The patient complains of nasal congestion, clear nasal discharge, and dry cough, but denies purulent nasal discharge, sore throat, productive cough, earache, and sick contacts.  The patient denies fever, low-grade fever (<100.5 degrees), fever of 100.5-103 degrees, fever of 103.1-104 degrees, fever to >104 degrees, stiff neck, dyspnea, wheezing, rash, vomiting, diarrhea, use of an antipyretic, and response to antipyretic.  The patient also reports itchy throat, sneezing, and headache.  The patient denies the following risk factors for Strep sinusitis: unilateral facial pain, unilateral nasal discharge, poor response to decongestant, double sickening, tooth pain, Strep exposure, tender adenopathy, and absence of cough.    Current Medications (verified): 1)  Zestoretic 10-12.5 Mg  Tabs (Lisinopril-Hydrochlorothiazide) .Marland Kitchen.. 1 A Day 2)  Flonase 50 Mcg/act  Susp (Fluticasone Propionate) .Marland Kitchen.. 1 Spray Each Nostril Prn 3)  Baby Asa .Marland KitchenMarland Kitchen. 1 A Day 4)  Vit D 5000 Iu .Marland KitchenMarland Kitchen. 1 A Day 5)  Hydrocodone-Acetaminophen 10-325 Mg Tabs (Hydrocodone-Acetaminophen) .... Take 1 Tab Every 6 Hours As Needed 6)  Allegra 180 Mg Tabs (Fexofenadine Hcl) .Marland Kitchen.. 1 By Mouth Once Daily 7)  Mucinex Dm 30-600 Mg Xr12h-Tab (Dextromethorphan-Guaifenesin) 8)  Qvar 40 Mcg/act Aers (Beclomethasone Dipropionate) .... 2puffs Two Times A Day 9)  Airborne (Otc) .... As Needed. 10)  Biaxin Xl 500 Mg  Xr24h-Tab (Clarithromycin) .... Take 2 By Mouth Once Daily For 14 Days  Allergies (verified): No Known Drug Allergies  Physical Exam  General:  Well-developed,well-nourished,in no acute distress; alert,appropriate and cooperative throughout examination Nose:  no external deformity, mucosal erythema, and mucosal edema.   Mouth:  pharyngeal erythema and postnasal drip.   Neck:  No deformities, masses, or tenderness noted. Lungs:  R decreased breath sounds and L decreased breath sounds.   Heart:  Normal rate and regular rhythm. S1 and S2 normal without gallop, murmur, click, rub or other extra sounds. Extremities:  No clubbing, cyanosis, edema, or deformity noted with normal full range of motion of all joints.   Skin:  Intact without suspicious lesions or rashes Cervical Nodes:  No lymphadenopathy noted Psych:  Cognition and judgment appear intact. Alert and cooperative with normal attention span and concentration. No apparent delusions, illusions, hallucinations   Impression & Recommendations:  Problem # 1:  COUGH (ICD-786.2)  Problem # 2:  BRONCHITIS- ACUTE (ICD-466.0)  The following medications were removed from the medication list:    Ceftin 500 Mg Tabs (Cefuroxime axetil) ..... One tablet by mouth two times a day x 10 days    Tessalon Perles 100 Mg Caps (Benzonatate) ..... One cap by mouth three times a day as needed cough    Zithromax Z-pak 250 Mg Tabs (Azithromycin) .Marland Kitchen..Marland Kitchen Two tablets by mouth today, then one tablet by mouth once daily for 4 more days Her updated  medication list for this problem includes:    Mucinex Dm 30-600 Mg Xr12h-tab (Dextromethorphan-guaifenesin)    Qvar 40 Mcg/act Aers (Beclomethasone dipropionate) .Marland Kitchen... 2puffs two times a day    Biaxin Xl 500 Mg Xr24h-tab (Clarithromycin) .Marland Kitchen... Take 2 by mouth once daily for 14 days  Take antibiotics and other medications as directed. Encouraged to push clear liquids, get enough rest, and take acetaminophen as needed. To  be seen in 5-7 days if no improvement, sooner if worse.  Complete Medication List: 1)  Zestoretic 10-12.5 Mg Tabs (Lisinopril-hydrochlorothiazide) .Marland Kitchen.. 1 a day 2)  Flonase 50 Mcg/act Susp (Fluticasone propionate) .Marland Kitchen.. 1 spray each nostril prn 3)  Baby Asa  .Marland Kitchen.. 1 a day 4)  Vit D 5000 Iu  .Marland Kitchen.. 1 a day 5)  Hydrocodone-acetaminophen 10-325 Mg Tabs (Hydrocodone-acetaminophen) .... Take 1 tab every 6 hours as needed 6)  Allegra 180 Mg Tabs (Fexofenadine hcl) .Marland Kitchen.. 1 by mouth once daily 7)  Mucinex Dm 30-600 Mg Xr12h-tab (Dextromethorphan-guaifenesin) 8)  Qvar 40 Mcg/act Aers (Beclomethasone dipropionate) .... 2puffs two times a day 9)  Airborne (otc)  .... As needed. 10)  Biaxin Xl 500 Mg Xr24h-tab (Clarithromycin) .... Take 2 by mouth once daily for 14 days Prescriptions: ALLEGRA 180 MG TABS (FEXOFENADINE HCL) 1 by mouth once daily  #30 x 2   Entered by:   Almeta Monas CMA (AAMA)   Authorized by:   Loreen Freud DO   Signed by:   Almeta Monas CMA (AAMA) on 05/15/2010   Method used:   Electronically to        [facility] [address] #[id number]* (retail)       [address]       [place], [place]  [address]       Ph: [phone]       Fax: [phone]   RxID:   [id number]    Orders Added: 1)  Est. Patient Level III [27062]

## 2010-07-10 NOTE — Progress Notes (Signed)
Summary: x-ray  Phone Note Outgoing Call Call back at Work Phone [phone]   Call placed by: Jeremy Johann CMA,  January 19, 2010 8:38 AM Details for Reason: + lingular pneumonia--- if pt is not feeling any better she should see someone tomorrow Summary of Call: left message to call office....................Marland KitchenFelecia Deloach CMA  January 19, 2010 8:38 AM   PT IS CALLING ABOUT HER X-RAY RESULT Initial call taken by: Freddy Jaksch,  January 19, 2010 9:24 AM  pt states that she is still no better pt schedule for OV today..............Marland KitchenFelecia Deloach CMA  January 19, 2010 11:52 AM

## 2010-07-10 NOTE — Letter (Signed)
Summary: [facility]  [facility]   Imported By: Lanelle Bal 03/12/2010 13:50:17  _____________________________________________________________________  External Attachment:    Type:   Image     Comment:   External Document

## 2010-07-10 NOTE — Letter (Signed)
[facility]   Imported By: Lanelle Bal 11/15/2009 10:54:56  _____________________________________________________________________  External Attachment:    Type:   Image     Comment:   External Document

## 2010-07-10 NOTE — Assessment & Plan Note (Signed)
Summary: CHEST CONGESTION/RH......--Rm 6   Vital Signs:  Patient profile:   75 year old female Height:      50.75 inches Weight:      108 pounds BMI:     29.59 O2 Sat:      99 % on Room air Temp:     98.2 degrees F oral Pulse rate:   103 / minute Pulse rhythm:   regular Resp:     18 per minute BP sitting:   146 / 82  (right arm) Cuff size:   regular  Vitals Entered By: Mervin Kung CMA Duncan Dull) (May 02, 2010 10:44 AM)  O2 Flow:  Room air CC: Pt states she has had head and chest congestion since last Friday. Is Patient Diabetic? No Pain Assessment Patient in pain? no      Comments Pt states she is not taking: Simvastatin, vit A , Femara.  She has completed QVAR and Avelox. Delilah Shan    Primary Care Provider:  Laury Axon  CC:  Pt states she has had head and chest congestion since last Friday.Marland Kitchen  History of Present Illness: Ms Coco is a 75 year old female with history of metastatic breast cancer (followed by Dr. Myna Hidalgo), who presents today with cheif complaint of  cough.   Symptoms started on Friday 11/18. Cough is productive and associated with green sputum, heavy head congesion, mild sore throat and green nasal discharge, but not associated with fever.   Symptoms are worsened by nothing.  Using Flonase, mucinex,  and Allegra with minimal improvement.     Allergies (verified): No Known Drug Allergies  Past History:  Past Medical History: Last updated: 09/15/2006 Allergic rhinitis Hyperlipidemia Hypertension Osteoporosis  Past Surgical History: Last updated: 09/15/2006 Hysterectomy1986 Tonsillectomy  Review of Systems       see HPI  Physical Exam  General:  Small statured female who appears younger than stated age.   Head:  Normocephalic and atraumatic without obvious abnormalities. No apparent alopecia or balding. Eyes:  PERRLA, no scleral injection Ears:  Narrow curved ear canals.  Unable to visualize L TM due to curvature of canal.  L TM slightly  dull without erythema or bulging. Nose:  no external erythema.   Mouth:  Oral mucosa and oropharynx without lesions or exudates.  Teeth in good repair. Neck:  No deformities, masses, or tenderness noted. Lungs:  Normal respiratory effort, chest expands symmetrically. Lungs are clear to auscultation, no crackles or wheezes. Heart:  Normal rate and regular rhythm. S1 and S2 normal without gallop, murmur, click, rub or other extra sounds. Psych:  Cognition and judgment appear intact. Alert and cooperative with normal attention span and concentration. No apparent delusions, illusions, hallucinations   Impression & Recommendations:  Problem # 1:  SINUSITIS (ICD-473.9) Assessment New Will plan to treat with ceftin.  Continue current flonase/mucinex.   The following medications were removed from the medication list:    Avelox Abc Pack 400 Mg Tabs (Moxifloxacin hcl) .Marland Kitchen... 1 pill once daily Her updated medication list for this problem includes:    Flonase 50 Mcg/act Susp (Fluticasone propionate) .Marland Kitchen... 1 spray each nostril prn    Mucinex Dm 30-600 Mg Xr12h-tab (Dextromethorphan-guaifenesin)    Ceftin 500 Mg Tabs (Cefuroxime axetil) ..... One tablet by mouth two times a day x 10 days    Tessalon Perles 100 Mg Caps (Benzonatate) ..... One cap by mouth three times a day as needed cough    Zithromax Z-pak 250 Mg Tabs (Azithromycin) .Marland Kitchen..Marland Kitchen Two tablets by mouth  today, then one tablet by mouth once daily for 4 more days  Problem # 2:  COUGH (ICD-786.2) Assessment: New Pt very concerned about possibility of recurrent pneumonia.  CXR notes lingular opacity which has decreased in size since august 2011 but not resolved.  Will plan treatment for possible early pneumonia.  Pt was contacted by phone and instructed to also take z-pack along with cephalosporin that she has already picked up.  Recommend to patient that she will need a follow up chest x-ray in 1 month to ensure clearing of this area.  F/u with Dr.  Laury Axon in 1-2 weeks.  Pt verbalizes understandign. Orders: CXR- 2view (CXR)  Complete Medication List: 1)  Zestoretic 10-12.5 Mg Tabs (Lisinopril-hydrochlorothiazide) .Marland Kitchen.. 1 a day 2)  Flonase 50 Mcg/act Susp (Fluticasone propionate) .Marland Kitchen.. 1 spray each nostril prn 3)  Baby Asa  .Marland Kitchen.. 1 a day 4)  Vit D 5000 Iu  .Marland Kitchen.. 1 a day 5)  Hydrocodone-acetaminophen 10-325 Mg Tabs (Hydrocodone-acetaminophen) .... Take 1 tab every 6 hours as needed 6)  Allegra 180 Mg Tabs (Fexofenadine hcl) .Marland Kitchen.. 1 by mouth once daily 7)  Mucinex Dm 30-600 Mg Xr12h-tab (Dextromethorphan-guaifenesin) 8)  Qvar 40 Mcg/act Aers (Beclomethasone dipropionate) .... 2puffs two times a day 9)  Airborne (otc)  .... As needed. 10)  Ceftin 500 Mg Tabs (Cefuroxime axetil) .... One tablet by mouth two times a day x 10 days 11)  Tessalon Perles 100 Mg Caps (Benzonatate) .... One cap by mouth three times a day as needed cough 12)  Zithromax Z-pak 250 Mg Tabs (Azithromycin) .... Two tablets by mouth today, then one tablet by mouth once daily for 4 more days  Patient Instructions: 1)  Please complete your chest x-ray on the first floor.   2)  Follow up with Dr Laury Axon in 1-2 weeks, sooner if symptoms worsen or do not improve. Prescriptions: ZITHROMAX Z-PAK 250 MG TABS (AZITHROMYCIN) two tablets by mouth today, then one tablet by mouth once daily for 4 more days  #1 pack x 0   Entered and Authorized by:   Lemont Fillers FNP   Signed by:   Lemont Fillers FNP on 05/02/2010   Method used:   Electronically to        [facility] #[id number]* (retail)       [address]       [place], [place]  [address]       Ph: [phone]       Fax: [phone]   RxID:   [id number] TESSALON PERLES 100 MG CAPS (BENZONATATE) one cap by mouth three times a day as needed cough  #30 x 0   Entered and Authorized by:   Lemont Fillers FNP   Signed by:   Lemont Fillers FNP on 05/02/2010   Method  used:   Electronically to        [facility] [phone]* (retail)       [address]       [place], [place]  [address]       Ph: [phone]       Fax: [phone]   RxID:   [id number] CEFTIN 500 MG TABS (CEFUROXIME AXETIL) one tablet by mouth two times a day x 10 days  #20 x 0   Entered and Authorized by:   Lemont Fillers FNP   Signed by:  Lemont Fillers FNP on 05/02/2010   Method used:   Electronically to        [facility] #[id number]* (retail)       [address]       [place], [place]  [address]       Ph: [phone]       Fax: [phone]   RxID:   [id number]    Orders Added: 1)  CXR- 2view [CXR] 2)  Est. Patient Level IV [36644]    Current Allergies (reviewed today): No known allergies

## 2010-07-10 NOTE — Letter (Signed)
Summary: [facility]  [facility]   Imported By: Lanelle Bal 04/12/2010 09:16:16  _____________________________________________________________________  External Attachment:    Type:   Image     Comment:   External Document

## 2010-07-10 NOTE — Letter (Signed)
Summary: [facility]   Imported By: Lanelle Bal 10/23/2009 12:44:40  _____________________________________________________________________  External Attachment:    Type:   Image     Comment:   External Document

## 2010-07-10 NOTE — Consult Note (Signed)
[facility]   Imported By: Lanelle Bal 11/08/2009 09:09:03  _____________________________________________________________________  External Attachment:    Type:   Image     Comment:   External Document

## 2010-07-10 NOTE — Letter (Signed)
Summary: [facility]  [facility]   Imported By: Lanelle Bal 10/09/2009 11:48:04  _____________________________________________________________________  External Attachment:    Type:   Image     Comment:   External Document

## 2010-07-10 NOTE — Miscellaneous (Signed)
Summary: Orders Update   Clinical Lists Changes  Orders: Added new Referral order of Radiology Referral (Radiology) - Signed 

## 2010-07-12 ENCOUNTER — Encounter: Payer: Self-pay | Admitting: Family Medicine

## 2010-07-12 ENCOUNTER — Encounter (HOSPITAL_BASED_OUTPATIENT_CLINIC_OR_DEPARTMENT_OTHER): Payer: Medicare Other | Admitting: Hematology & Oncology

## 2010-07-12 DIAGNOSIS — Z23 Encounter for immunization: Secondary | ICD-10-CM

## 2010-07-12 DIAGNOSIS — C7952 Secondary malignant neoplasm of bone marrow: Secondary | ICD-10-CM

## 2010-07-12 DIAGNOSIS — C50119 Malignant neoplasm of central portion of unspecified female breast: Secondary | ICD-10-CM

## 2010-07-12 LAB — BASIC METABOLIC PANEL
BUN: 20 mg/dL (ref 6–23)
Calcium: 9.6 mg/dL (ref 8.4–10.5)
Chloride: 104 mEq/L (ref 96–112)
Creatinine, Ser: 0.73 mg/dL (ref 0.40–1.20)
Potassium: 3.9 mEq/L (ref 3.5–5.3)

## 2010-07-12 NOTE — Letter (Signed)
Summary: [facility]  [facility]   Imported By: Lanelle Bal 07/05/2010 13:14:28  _____________________________________________________________________  External Attachment:    Type:   Image     Comment:   External Document

## 2010-07-12 NOTE — Letter (Signed)
Summary: [facility]  [facility]   Imported By: Lanelle Bal 05/21/2010 14:14:23  _____________________________________________________________________  External Attachment:    Type:   Image     Comment:   External Document

## 2010-07-12 NOTE — Assessment & Plan Note (Signed)
Summary: cold/kn   Vital Signs:  Patient profile:   75 year old female Weight:      118.4 pounds Temp:     98.2 degrees F oral BP sitting:   130 / 80  (right arm) Cuff size:   regular  Vitals Entered By: Almeta Monas CMA Duncan Dull) (July 05, 2010 11:18 AM) CC: x2days c/o tastebuds being bad, sinus drainage, cough, tickle in throat, Cough   History of Present Illness:  Cough      This is a 75 year old woman who presents with Cough.  The symptoms began 2 days ago.  The patient reports productive cough, but denies non-productive cough, pleuritic chest pain, shortness of breath, wheezing, exertional dyspnea, fever, hemoptysis, and malaise.  The patient denies the following symptoms: cold/URI symptoms, sore throat, nasal congestion, chronic rhinitis, weight loss, acid reflux symptoms, and peripheral edema.  Ineffective prior treatments have included OTC cough medication.  Risk factors include history of allergic rhinitis.  Pt using flonase, allegra and mucinex.  She has run out of her inhaler.  Current Medications (verified): 1)  Zestoretic 10-12.5 Mg  Tabs (Lisinopril-Hydrochlorothiazide) .Marland Kitchen.. 1 A Day 2)  Flonase 50 Mcg/act  Susp (Fluticasone Propionate) .Marland Kitchen.. 1 Spray Each Nostril Prn 3)  Baby Asa .Marland KitchenMarland Kitchen. 1 A Day 4)  Vit D 5000 Iu .Marland KitchenMarland Kitchen. 1 A Day 5)  Hydrocodone-Acetaminophen 10-325 Mg Tabs (Hydrocodone-Acetaminophen) .... Take 1 Tab Every 6 Hours As Needed 6)  Allegra 180 Mg Tabs (Fexofenadine Hcl) .Marland Kitchen.. 1 By Mouth Once Daily 7)  Mucinex Dm 30-600 Mg Xr12h-Tab (Dextromethorphan-Guaifenesin) 8)  Qvar 40 Mcg/act Aers (Beclomethasone Dipropionate) .... 2puffs Two Times A Day 9)  Airborne (Otc) .... As Needed. 10)  Zithromax Z-Pak 250 Mg Tabs (Azithromycin) .... As Directed  Allergies (verified): No Known Drug Allergies  Past History:  Past medical, surgical, family and social histories (including risk factors) reviewed for relevance to current acute and chronic problems.  Past Medical  History: Reviewed history from 09/15/2006 and no changes required. Allergic rhinitis Hyperlipidemia Hypertension Osteoporosis  Past Surgical History: Reviewed history from 09/15/2006 and no changes required. Hysterectomy1986 Tonsillectomy  Family History: Reviewed history from 09/15/2006 and no changes required. Family History of CAD Female 1st degree relative <50 cancer M and B? etiology Family History of Arthritis Family History Depression  Social History: Reviewed history from 09/15/2006 and no changes required. 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  Otoscopic examination reveals clear canals, tympanic membranes are intact bilaterally without bulging, retraction, inflammation or discharge. Hearing is grossly normal bilaterally. Nose:  External nasal examination shows no deformity or inflammation. Nasal mucosa are pink and moist without lesions or exudates. Mouth:  pharyngeal erythema and postnasal drip.   Neck:  No deformities, masses, or tenderness noted. Lungs:  Normal respiratory effort, chest expands symmetrically. Lungs are clear to auscultation, no crackles or wheezes. Psych:  Oriented X3 and normally interactive.     Impression & Recommendations:  Problem # 1:  URI (ICD-465.9)  Her updated medication list for this problem includes:    Allegra 180 Mg Tabs (Fexofenadine hcl) .Marland Kitchen... 1 by mouth once daily    Mucinex Dm 30-600 Mg Xr12h-tab (Dextromethorphan-guaifenesin)    flonase  if no better in 4-7 days or symptoms worsen---fill abx  Instructed on symptomatic treatment. Call if symptoms  persist or worsen.   Complete Medication List: 1)  Zestoretic 10-12.5 Mg Tabs (Lisinopril-hydrochlorothiazide) .Marland Kitchen.. 1 a day 2)  Flonase 50 Mcg/act  Susp (Fluticasone propionate) .Marland Kitchen.. 1 spray each nostril prn 3)  Baby Asa  .Marland Kitchen.. 1 a day 4)  Vit D 5000 Iu  .Marland Kitchen.. 1 a day 5)  Hydrocodone-acetaminophen 10-325 Mg Tabs (Hydrocodone-acetaminophen) .... Take 1 tab every 6 hours as needed 6)  Allegra 180 Mg Tabs (Fexofenadine hcl) .Marland Kitchen.. 1 by mouth once daily 7)  Mucinex Dm 30-600 Mg Xr12h-tab (Dextromethorphan-guaifenesin) 8)  Qvar 40 Mcg/act Aers (Beclomethasone dipropionate) .... 2puffs two times a day 9)  Airborne (otc)  .... As needed. 10)  Zithromax Z-pak 250 Mg Tabs (Azithromycin) .... As directed Prescriptions: ZITHROMAX Z-PAK 250 MG TABS (AZITHROMYCIN) as directed  #1 x 0   Entered and Authorized by:   Loreen Freud DO   Signed by:   Loreen Freud DO on 07/05/2010   Method used:   Print then Give to Patient   RxID:   [id number]    Orders Added: 1)  Est. Patient Level III [14782]

## 2010-08-08 ENCOUNTER — Encounter (HOSPITAL_BASED_OUTPATIENT_CLINIC_OR_DEPARTMENT_OTHER): Payer: MEDICARE | Admitting: Hematology & Oncology

## 2010-08-08 ENCOUNTER — Other Ambulatory Visit: Payer: Self-pay | Admitting: Hematology & Oncology

## 2010-08-08 DIAGNOSIS — Z23 Encounter for immunization: Secondary | ICD-10-CM

## 2010-08-08 DIAGNOSIS — C50119 Malignant neoplasm of central portion of unspecified female breast: Secondary | ICD-10-CM

## 2010-08-08 DIAGNOSIS — C7951 Secondary malignant neoplasm of bone: Secondary | ICD-10-CM

## 2010-08-08 LAB — CBC WITH DIFFERENTIAL (CANCER CENTER ONLY)
BASO#: 0.1 10*3/uL (ref 0.0–0.2)
BASO%: 0.5 % (ref 0.0–2.0)
EOS%: 0.9 % (ref 0.0–7.0)
HCT: 39.4 % (ref 34.8–46.6)
HGB: 13.6 g/dL (ref 11.6–15.9)
LYMPH#: 4.6 10*3/uL — ABNORMAL HIGH (ref 0.9–3.3)
MCHC: 34.4 g/dL (ref 32.0–36.0)
MONO#: 0.5 10*3/uL (ref 0.1–0.9)
NEUT#: 5.4 10*3/uL (ref 1.5–6.5)
NEUT%: 50.5 % (ref 39.6–80.0)
RDW: 10.8 % (ref 10.5–14.6)
WBC: 10.6 10*3/uL — ABNORMAL HIGH (ref 3.9–10.0)

## 2010-08-08 LAB — CMP (CANCER CENTER ONLY)
ALT(SGPT): 20 U/L (ref 10–47)
AST: 27 U/L (ref 11–38)
Albumin: 4.3 g/dL (ref 3.3–5.5)
CO2: 28 mEq/L (ref 18–33)
Calcium: 9.1 mg/dL (ref 8.0–10.3)
Chloride: 99 mEq/L (ref 98–108)
Creat: 0.7 mg/dl (ref 0.6–1.2)
Potassium: 3.9 mEq/L (ref 3.3–4.7)

## 2010-08-08 LAB — CANCER ANTIGEN 27.29: CA 27.29: 67 U/mL — ABNORMAL HIGH (ref 0–39)

## 2010-08-16 NOTE — Letter (Signed)
Summary: [facility]  [facility]   Imported By: Maryln Gottron 08/09/2010 10:38:31  _____________________________________________________________________  External Attachment:    Type:   Image     Comment:   External Document

## 2010-08-23 ENCOUNTER — Ambulatory Visit (INDEPENDENT_AMBULATORY_CARE_PROVIDER_SITE_OTHER): Payer: MEDICARE | Admitting: Family Medicine

## 2010-08-23 ENCOUNTER — Encounter: Payer: Self-pay | Admitting: Family Medicine

## 2010-08-23 DIAGNOSIS — S058X9A Other injuries of unspecified eye and orbit, initial encounter: Secondary | ICD-10-CM | POA: Insufficient documentation

## 2010-08-28 LAB — URINALYSIS, ROUTINE W REFLEX MICROSCOPIC
Bilirubin Urine: NEGATIVE
Hgb urine dipstick: NEGATIVE
Ketones, ur: NEGATIVE mg/dL
Nitrite: NEGATIVE
Protein, ur: NEGATIVE mg/dL
Urobilinogen, UA: 0.2 mg/dL (ref 0.0–1.0)

## 2010-08-28 NOTE — Assessment & Plan Note (Signed)
Summary: EYE PAIN, RUNNING/RH......Marland Kitchen   Vital Signs:  Patient profile:   75 year old female Height:      50.75 inches (128.91 cm) Weight:      118 pounds (53.64 kg) BMI:     32.33 Temp:     97.0 degrees F (36.11 degrees C) oral BP sitting:   130 / 80  (left arm) Cuff size:   regular  Vitals Entered By: Anna Benjamin CMA (August 23, 2010 9:34 AM) CC: C/O eye pain since this AM and allergies, runny nose, etc./kb Is Patient Diabetic? No Pain Assessment Patient in pain? yes     Location: eye Intensity: 8 Type: dull ache Onset of pain  this AM Comments Patient denies injury to the eye.   History of Present Illness: 75 yo woman here today w/ L eye pain.  sxs just started this AM.  awoke w/ eye pain and redness.  + photophobia.  + sensation of foreign body.  eye drainage is thin and watery.  no eye trauma.  + seasonal allergies.  Current Medications (verified): 1)  Zestoretic 10-12.5 Mg  Tabs (Lisinopril-Hydrochlorothiazide) .Marland Kitchen.. 1 A Day 2)  Flonase 50 Mcg/act  Susp (Fluticasone Propionate) .Marland Kitchen.. 1 Spray Each Nostril Prn 3)  Baby Asa .Marland KitchenMarland Kitchen. 1 A Day 4)  Vit D 5000 Iu .Marland KitchenMarland Kitchen. 1 A Day 5)  Hydrocodone-Acetaminophen 10-325 Mg Tabs (Hydrocodone-Acetaminophen) .... Take 1 Tab Every 6 Hours As Needed 6)  Allegra 180 Mg Tabs (Fexofenadine Hcl) .Marland Kitchen.. 1 By Mouth Once Daily 7)  Mucinex Dm 30-600 Mg Xr12h-Tab (Dextromethorphan-Guaifenesin) 8)  Qvar 40 Mcg/act Aers (Beclomethasone Dipropionate) .... 2puffs Two Times A Day 9)  Airborne (Otc) .... As Needed.  Allergies (verified): No Known Drug Allergies  Review of Systems      See HPI  Physical Exam  General:  Well-developed,well-nourished,in no acute distress; alert,appropriate and cooperative throughout examination Head:  Normocephalic and atraumatic without obvious abnormalities. No apparent alopecia or balding.  no TTP over frontal or maxillary sinuses Eyes:  PERRL, EOMI L eye w/ injxn medially, + corneal abrasion on fluoroscein exam Ears:   External ear exam shows no significant lesions or deformities.  Otoscopic examination reveals clear canals, tympanic membranes are intact bilaterally without bulging, retraction, inflammation or discharge. Hearing is grossly normal bilaterally.   Impression & Recommendations:  Problem # 1:  CORNEAL ABRASION (ICD-918.1) Assessment New start abx drops to prevent infxn.  pt advised to not rub or scratch eye.  reviewed supportive care and red flags that should prompt return.  Pt expresses understanding and is in agreement w/ this plan.  Complete Medication List: 1)  Zestoretic 10-12.5 Mg Tabs (Lisinopril-hydrochlorothiazide) .Marland Kitchen.. 1 a day 2)  Flonase 50 Mcg/act Susp (Fluticasone propionate) .Marland Kitchen.. 1 spray each nostril prn 3)  Baby Asa  .Marland Kitchen.. 1 a day 4)  Vit D 5000 Iu  .Marland Kitchen.. 1 a day 5)  Hydrocodone-acetaminophen 10-325 Mg Tabs (Hydrocodone-acetaminophen) .... Take 1 tab every 6 hours as needed 6)  Allegra 180 Mg Tabs (Fexofenadine hcl) .Marland Kitchen.. 1 by mouth once daily 7)  Mucinex Dm 30-600 Mg Xr12h-tab (Dextromethorphan-guaifenesin) 8)  Qvar 40 Mcg/act Aers (Beclomethasone dipropionate) .... 2puffs two times a day 9)  Airborne (otc)  .... As needed. 10)  Polytrim 10000-0.1 Unit/ml-% Soln (Polymyxin b-trimethoprim) .... 2 drops to affected eye four times daily x3-5 days  Patient Instructions: 1)  You have a small scratch on the inner corner of your eye 2)  Use the eye drops as directed 3)  AVOID SCRATCHING,  RUBBING, etc 4)  If your eye pain is worsening- please let us know immediately.  If no improvement by Monday, please call 5)  Hang in there! Prescriptions: POLYTRIM 10000-0.1 UNIT/ML-% SOLN (POLYMYXIN B-TRIMETHOPRIM) 2 drops to affected eye four times daily x3-5 days  #1 bottle x 0   Entered and Authorized by:   Neena Rhymes MD   Signed by:   Neena Rhymes MD on 08/23/2010   Method used:   Electronically to        [facility] [address] [id number] (retail)       [address]       [place], [place]  [address]       Ph: [phone]       Fax: [phone]   RxID:   [id number]    Orders Added: 1)  Est. Patient Level III [[address]]

## 2010-09-07 ENCOUNTER — Other Ambulatory Visit: Payer: Self-pay | Admitting: Hematology & Oncology

## 2010-09-07 ENCOUNTER — Encounter (HOSPITAL_BASED_OUTPATIENT_CLINIC_OR_DEPARTMENT_OTHER): Payer: MEDICARE | Admitting: Hematology & Oncology

## 2010-09-07 DIAGNOSIS — C7952 Secondary malignant neoplasm of bone marrow: Secondary | ICD-10-CM

## 2010-09-07 DIAGNOSIS — C50119 Malignant neoplasm of central portion of unspecified female breast: Secondary | ICD-10-CM

## 2010-09-07 LAB — BASIC METABOLIC PANEL
BUN: 15 mg/dL (ref 6–23)
CO2: 24 mEq/L (ref 19–32)
Chloride: 106 mEq/L (ref 96–112)
Glucose, Bld: 121 mg/dL — ABNORMAL HIGH (ref 70–99)
Potassium: 3.7 mEq/L (ref 3.5–5.3)
Sodium: 139 mEq/L (ref 135–145)

## 2010-10-03 ENCOUNTER — Other Ambulatory Visit: Payer: Self-pay | Admitting: Family Medicine

## 2010-10-10 ENCOUNTER — Other Ambulatory Visit: Payer: Self-pay | Admitting: Hematology & Oncology

## 2010-10-10 ENCOUNTER — Encounter (HOSPITAL_BASED_OUTPATIENT_CLINIC_OR_DEPARTMENT_OTHER): Payer: MEDICARE | Admitting: Hematology & Oncology

## 2010-10-10 DIAGNOSIS — Z23 Encounter for immunization: Secondary | ICD-10-CM

## 2010-10-10 DIAGNOSIS — C50119 Malignant neoplasm of central portion of unspecified female breast: Secondary | ICD-10-CM

## 2010-10-10 DIAGNOSIS — C7951 Secondary malignant neoplasm of bone: Secondary | ICD-10-CM

## 2010-10-10 LAB — CMP (CANCER CENTER ONLY)
Albumin: 3.8 g/dL (ref 3.3–5.5)
Alkaline Phosphatase: 55 U/L (ref 26–84)
BUN, Bld: 14 mg/dL (ref 7–22)
Creat: 0.7 mg/dl (ref 0.6–1.2)
Glucose, Bld: 104 mg/dL (ref 73–118)
Potassium: 4.3 mEq/L (ref 3.3–4.7)

## 2010-10-10 LAB — CBC WITH DIFFERENTIAL (CANCER CENTER ONLY)
BASO#: 0 10*3/uL (ref 0.0–0.2)
BASO%: 0.3 % (ref 0.0–2.0)
EOS%: 1.1 % (ref 0.0–7.0)
HCT: 37 % (ref 34.8–46.6)
HGB: 12.5 g/dL (ref 11.6–15.9)
LYMPH#: 4.6 10*3/uL — ABNORMAL HIGH (ref 0.9–3.3)
LYMPH%: 47.8 % (ref 14.0–48.0)
MCH: 32 pg (ref 26.0–34.0)
MCHC: 33.8 g/dL (ref 32.0–36.0)
MCV: 95 fL (ref 81–101)
MONO%: 6.9 % (ref 0.0–13.0)
NEUT%: 43.9 % (ref 39.6–80.0)
RDW: 12.2 % (ref 11.1–15.7)

## 2010-10-17 ENCOUNTER — Emergency Department (HOSPITAL_COMMUNITY)
Admission: EM | Admit: 2010-10-17 | Discharge: 2010-10-17 | Disposition: A | Discharge status: Home / Self Care | Payer: Medicare Other | Attending: Emergency Medicine | Admitting: Emergency Medicine

## 2010-10-17 ENCOUNTER — Emergency Department (HOSPITAL_COMMUNITY): Payer: Medicare Other

## 2010-10-17 DIAGNOSIS — I10 Essential (primary) hypertension: Secondary | ICD-10-CM | POA: Insufficient documentation

## 2010-10-17 DIAGNOSIS — E7800 Pure hypercholesterolemia, unspecified: Secondary | ICD-10-CM | POA: Insufficient documentation

## 2010-10-17 DIAGNOSIS — K224 Dyskinesia of esophagus: Secondary | ICD-10-CM | POA: Insufficient documentation

## 2010-10-17 DIAGNOSIS — N390 Urinary tract infection, site not specified: Secondary | ICD-10-CM | POA: Insufficient documentation

## 2010-10-17 DIAGNOSIS — R1013 Epigastric pain: Secondary | ICD-10-CM | POA: Insufficient documentation

## 2010-10-17 DIAGNOSIS — R079 Chest pain, unspecified: Secondary | ICD-10-CM | POA: Insufficient documentation

## 2010-10-17 LAB — DIFFERENTIAL
Basophils Absolute: 0 10*3/uL (ref 0.0–0.1)
Basophils Relative: 0 % (ref 0–1)
Lymphocytes Relative: 30 % (ref 12–46)
Monocytes Absolute: 0.6 10*3/uL (ref 0.1–1.0)
Neutro Abs: 8.8 10*3/uL — ABNORMAL HIGH (ref 1.7–7.7)
Neutrophils Relative %: 66 % (ref 43–77)

## 2010-10-17 LAB — COMPREHENSIVE METABOLIC PANEL
AST: 24 U/L (ref 0–37)
CO2: 22 mEq/L (ref 19–32)
Calcium: 10.7 mg/dL — ABNORMAL HIGH (ref 8.4–10.5)
Creatinine, Ser: 0.7 mg/dL (ref 0.4–1.2)
GFR calc Af Amer: >60 mL/min (ref >60)
GFR calc non Af Amer: >60 mL/min (ref >60)
Glucose, Bld: 138 mg/dL — ABNORMAL HIGH (ref 70–99)
Total Protein: 7.1 g/dL (ref 6.0–8.3)

## 2010-10-17 LAB — CBC
Hemoglobin: 14 g/dL (ref 12.0–15.0)
MCHC: 33.7 g/dL (ref 30.0–36.0)
WBC: 13.4 10*3/uL — ABNORMAL HIGH (ref 4.0–10.5)

## 2010-10-17 LAB — LIPASE, BLOOD: Lipase: 42 U/L (ref 11–59)

## 2010-10-17 LAB — URINALYSIS, ROUTINE W REFLEX MICROSCOPIC
Nitrite: NEGATIVE
Specific Gravity, Urine: 1.02 (ref 1.005–1.030)
Urobilinogen, UA: 0.2 mg/dL (ref 0.0–1.0)

## 2010-10-17 LAB — POCT CARDIAC MARKERS
CKMB, poc: 1 ng/mL (ref 1.0–8.0)
Myoglobin, poc: 52.8 ng/mL (ref 12–200)

## 2010-10-17 LAB — URINE MICROSCOPIC-ADD ON

## 2010-10-18 LAB — URINE CULTURE
Colony Count: 6000
Culture  Setup Time: 201205091021

## 2010-10-20 ENCOUNTER — Encounter: Payer: Self-pay | Admitting: Family Medicine

## 2010-10-25 ENCOUNTER — Encounter: Payer: Self-pay | Admitting: Family Medicine

## 2010-10-25 ENCOUNTER — Ambulatory Visit (INDEPENDENT_AMBULATORY_CARE_PROVIDER_SITE_OTHER): Payer: Medicare Other | Admitting: Family Medicine

## 2010-10-25 VITALS — BP 132/82 | HR 70 | Temp 98.4°F | Wt 118.0 lb

## 2010-10-25 DIAGNOSIS — N390 Urinary tract infection, site not specified: Secondary | ICD-10-CM

## 2010-10-25 DIAGNOSIS — C7951 Secondary malignant neoplasm of bone: Secondary | ICD-10-CM

## 2010-10-25 DIAGNOSIS — K219 Gastro-esophageal reflux disease without esophagitis: Secondary | ICD-10-CM

## 2010-10-25 LAB — POCT URINALYSIS DIPSTICK
Blood, UA: NEGATIVE
Glucose, UA: NEGATIVE
Protein, UA: NEGATIVE
Spec Grav, UA: 1.03
Urobilinogen, UA: 0.2

## 2010-10-25 MED ORDER — OMEPRAZOLE 20 MG PO CPDR: 20.0000 mg | DELAYED_RELEASE_CAPSULE | Freq: Every day | ORAL | Status: DC

## 2010-10-25 MED ORDER — ZOLEDRONIC ACID 4 MG/100ML IV SOLN: 4.0000 mg | INTRAVENOUS | Status: DC

## 2010-10-25 NOTE — Progress Notes (Signed)
  Subjective:     Anna Benjamin is an 75 y.o. female who presents for evaluation of heartburn. This has been associated with belching, chest pain, cough and heartburn. She denies bilious reflux, choking on food, difficulty swallowing, fullness after meals, nausea, shortness of breath and wheezing. Symptoms have been present for 9 days. She has had dysphagia for both solids and liquids. She has not lost weight. She denies melena, hematochezia, hematemesis, and coffee ground emesis. Medical therapy in the past has included: none and ER pt her on omeprazole.  The following portions of the patient's history were reviewed and updated as appropriate: allergies, current medications, past family history, past medical history, past social history, past surgical history and problem list.  Review of Systems Pertinent items are noted in HPI. --- see er records  Objective:     BP 132/82  Pulse 70  Temp(Src) 98.4 F (36.9 C) (Oral)  Wt 118 lb (53.524 kg)  SpO2 98% General appearance: alert, cooperative, appears stated age and no distress Throat: lips, mucosa, and tongue normal; teeth and gums normal Lungs: clear to auscultation bilaterally Heart: regular rate and rhythm, S1, S2 normal, no murmur, click, rub or gallop Abdomen: soft, non-tender; bowel sounds normal; no masses,  no organomegaly   Assessment:    Gastroesophageal Reflux Disease,      Plan:    Nonpharmacologic treatments were discussed including: eating smaller meals, elevation of the head of bed at night, avoidance of caffeine, chocolate, nicotine and peppermint, and avoiding tight fitting clothing. Will start a trial of proton pump inhibitors. "Red Flag" symptoms of gerd are present. Will refer to GI for further evaluation. Follow up in 2 months or sooner as needed.  Pt wants to hold off on GI referral for now

## 2010-10-25 NOTE — Patient Instructions (Signed)
  Esophagitis (Heartburn) Esophagitis (heartburn) is a painful, burning sensation in the chest. It may feel worse in certain positions, such as lying down or bending over. It is caused by stomach acid backing up into the tube that carries food from the mouth down to the stomach (lower esophagus). TREATMENT There are a number of non-prescription medicines used to treat heartburn, including:  Antacids.   Acid reducers (also called H-2 blockers).   Proton-pump inhibitors.  HOME CARE INSTRUCTIONS  Raise the head of your bead by putting blocks under the legs.   Eat 2-3 hours before going to bed.   Stop smoking.   Try to reach and maintain a healthy weight.   Do not eat just a few very large meals. Instead, eat many smaller meals throughout the day.   Try to identify foods and beverages that make your symptoms worse, and avoid these.   Avoid tight clothing.   Do not exercise right after eating.  SEEK IMMEDIATE MEDICAL CARE IF YOU:  Have severe chest pain that goes down your arm, or into your jaw or neck.   Feel sweaty, dizzy, or lightheaded.   Are short of breath.   Throw up (vomit) blood.   Have difficulty or pain with swallowing.   Have bloody or black, tarry stools.   Have bouts of heartburn more than three times a week for more than two weeks.  Document Released: 07/04/2004 Document Re-Released: 08/21/2009 [facility] Patient Information 2011 [place], [place].Place gastroesophageal reflux patient instructions here.

## 2010-10-25 NOTE — Progress Notes (Signed)
Addended by: Almeta Monas on: 10/25/2010 11:04 AM   Modules accepted: Orders

## 2010-10-26 ENCOUNTER — Encounter: Payer: Self-pay | Admitting: *Deleted

## 2010-11-02 ENCOUNTER — Other Ambulatory Visit: Payer: Self-pay | Admitting: Hematology & Oncology

## 2010-11-02 ENCOUNTER — Encounter (HOSPITAL_BASED_OUTPATIENT_CLINIC_OR_DEPARTMENT_OTHER): Payer: Medicare Other | Admitting: Hematology & Oncology

## 2010-11-02 DIAGNOSIS — C7951 Secondary malignant neoplasm of bone: Secondary | ICD-10-CM

## 2010-11-02 DIAGNOSIS — C50119 Malignant neoplasm of central portion of unspecified female breast: Secondary | ICD-10-CM

## 2010-11-02 LAB — CBC WITH DIFFERENTIAL (CANCER CENTER ONLY)
BASO%: 0.2 % (ref 0.0–2.0)
HCT: 37.3 % (ref 34.8–46.6)
LYMPH%: 46.4 % (ref 14.0–48.0)
MCV: 95 fL (ref 81–101)
MONO#: 0.7 10*3/uL (ref 0.1–0.9)
NEUT%: 45.7 % (ref 39.6–80.0)
RDW: 12 % (ref 11.1–15.7)
WBC: 10.3 10*3/uL — ABNORMAL HIGH (ref 3.9–10.0)

## 2010-11-02 LAB — CMP (CANCER CENTER ONLY)
ALT(SGPT): 18 U/L (ref 10–47)
BUN, Bld: 20 mg/dL (ref 7–22)
CO2: 26 mEq/L (ref 18–33)
Creat: 0.5 mg/dl — ABNORMAL LOW (ref 0.6–1.2)
Glucose, Bld: 106 mg/dL (ref 73–118)
Total Bilirubin: 0.6 mg/dl (ref 0.20–1.60)

## 2010-11-02 LAB — IRON AND TIBC
Iron: 119 ug/dL (ref 42–145)
UIBC: 191 ug/dL

## 2010-11-02 LAB — LACTATE DEHYDROGENASE: LDH: 190 U/L (ref 94–250)

## 2010-11-02 LAB — CANCER ANTIGEN 27.29: CA 27.29: 81 U/mL — ABNORMAL HIGH (ref 0–39)

## 2010-11-02 LAB — FERRITIN: Ferritin: 109 ng/mL (ref 10–291)

## 2010-11-30 ENCOUNTER — Other Ambulatory Visit: Payer: Self-pay | Admitting: Hematology & Oncology

## 2010-11-30 ENCOUNTER — Encounter (HOSPITAL_BASED_OUTPATIENT_CLINIC_OR_DEPARTMENT_OTHER): Payer: Medicare Other | Admitting: Hematology & Oncology

## 2010-11-30 DIAGNOSIS — C50119 Malignant neoplasm of central portion of unspecified female breast: Secondary | ICD-10-CM

## 2010-11-30 DIAGNOSIS — C7951 Secondary malignant neoplasm of bone: Secondary | ICD-10-CM

## 2010-11-30 DIAGNOSIS — C7952 Secondary malignant neoplasm of bone marrow: Secondary | ICD-10-CM

## 2010-11-30 LAB — BASIC METABOLIC PANEL
BUN: 19 mg/dL (ref 6–23)
CO2: 26 mEq/L (ref 19–32)
Calcium: 9.7 mg/dL (ref 8.4–10.5)
Creatinine, Ser: 0.66 mg/dL (ref 0.50–1.10)
Glucose, Bld: 90 mg/dL (ref 70–99)
Sodium: 140 mEq/L (ref 135–145)

## 2010-12-27 ENCOUNTER — Other Ambulatory Visit: Payer: Self-pay | Admitting: Family Medicine

## 2010-12-27 NOTE — Telephone Encounter (Signed)
Refill sent.

## 2010-12-28 ENCOUNTER — Other Ambulatory Visit: Payer: Self-pay | Admitting: Hematology & Oncology

## 2010-12-28 ENCOUNTER — Encounter (HOSPITAL_BASED_OUTPATIENT_CLINIC_OR_DEPARTMENT_OTHER): Payer: Medicare Other | Admitting: Hematology & Oncology

## 2010-12-28 DIAGNOSIS — M545 Low back pain: Secondary | ICD-10-CM

## 2010-12-28 DIAGNOSIS — C7951 Secondary malignant neoplasm of bone: Secondary | ICD-10-CM

## 2010-12-28 DIAGNOSIS — C50119 Malignant neoplasm of central portion of unspecified female breast: Secondary | ICD-10-CM

## 2010-12-28 LAB — CBC WITH DIFFERENTIAL (CANCER CENTER ONLY)
BASO%: 0.2 % (ref 0.0–2.0)
EOS%: 0.4 % (ref 0.0–7.0)
Eosinophils Absolute: 0 10*3/uL (ref 0.0–0.5)
MCH: 32.4 pg (ref 26.0–34.0)
MCHC: 34.2 g/dL (ref 32.0–36.0)
MONO%: 6.2 % (ref 0.0–13.0)
NEUT#: 4.1 10*3/uL (ref 1.5–6.5)
Platelets: 198 10*3/uL (ref 145–400)
RBC: 3.98 10*6/uL (ref 3.70–5.32)
RDW: 11.6 % (ref 11.1–15.7)

## 2010-12-29 LAB — COMPREHENSIVE METABOLIC PANEL
ALT: 15 U/L (ref 0–35)
AST: 18 U/L (ref 0–37)
Albumin: 4.1 g/dL (ref 3.5–5.2)
Alkaline Phosphatase: 72 U/L (ref 39–117)
Potassium: 4 mEq/L (ref 3.5–5.3)
Sodium: 137 mEq/L (ref 135–145)
Total Protein: 6 g/dL (ref 6.0–8.3)

## 2010-12-29 LAB — VITAMIN D 25 HYDROXY (VIT D DEFICIENCY, FRACTURES): Vit D, 25-Hydroxy: 92 ng/mL — ABNORMAL HIGH (ref 30–89)

## 2011-01-14 ENCOUNTER — Encounter (HOSPITAL_COMMUNITY)
Admission: RE | Admit: 2011-01-14 | Discharge: 2011-01-14 | Disposition: A | Discharge status: Home / Self Care | Payer: Medicare Other | Source: Ambulatory Visit | Attending: Hematology & Oncology | Admitting: Hematology & Oncology

## 2011-01-14 ENCOUNTER — Encounter (HOSPITAL_COMMUNITY): Payer: Self-pay

## 2011-01-14 DIAGNOSIS — M549 Dorsalgia, unspecified: Secondary | ICD-10-CM | POA: Insufficient documentation

## 2011-01-14 DIAGNOSIS — M545 Low back pain: Secondary | ICD-10-CM

## 2011-01-14 DIAGNOSIS — C50919 Malignant neoplasm of unspecified site of unspecified female breast: Secondary | ICD-10-CM | POA: Insufficient documentation

## 2011-01-14 DIAGNOSIS — C7952 Secondary malignant neoplasm of bone marrow: Secondary | ICD-10-CM | POA: Insufficient documentation

## 2011-01-14 DIAGNOSIS — C7951 Secondary malignant neoplasm of bone: Secondary | ICD-10-CM | POA: Insufficient documentation

## 2011-01-14 MED ORDER — TECHNETIUM TC 99M MEDRONATE IV KIT: 25.0000 | PACK | Freq: Once | INTRAVENOUS | Status: DC | PRN

## 2011-01-25 ENCOUNTER — Encounter (HOSPITAL_BASED_OUTPATIENT_CLINIC_OR_DEPARTMENT_OTHER): Payer: Medicare Other | Admitting: Hematology & Oncology

## 2011-01-25 ENCOUNTER — Other Ambulatory Visit: Payer: Self-pay | Admitting: Hematology & Oncology

## 2011-01-25 DIAGNOSIS — C50119 Malignant neoplasm of central portion of unspecified female breast: Secondary | ICD-10-CM

## 2011-01-25 DIAGNOSIS — C7952 Secondary malignant neoplasm of bone marrow: Secondary | ICD-10-CM

## 2011-01-25 LAB — CBC WITH DIFFERENTIAL (CANCER CENTER ONLY)
BASO%: 0.5 % (ref 0.0–2.0)
Eosinophils Absolute: 0.1 10*3/uL (ref 0.0–0.5)
HCT: 38.6 % (ref 34.8–46.6)
LYMPH#: 3 10*3/uL (ref 0.9–3.3)
LYMPH%: 37.3 % (ref 14.0–48.0)
MCV: 94 fL (ref 81–101)
MONO#: 0.6 10*3/uL (ref 0.1–0.9)
NEUT%: 53.4 % (ref 39.6–80.0)
RBC: 4.09 10*6/uL (ref 3.70–5.32)
RDW: 12 % (ref 11.1–15.7)
WBC: 8 10*3/uL (ref 3.9–10.0)

## 2011-01-25 LAB — COMPREHENSIVE METABOLIC PANEL
ALT: 34 U/L (ref 0–35)
Alkaline Phosphatase: 81 U/L (ref 39–117)
Glucose, Bld: 76 mg/dL (ref 70–99)
Sodium: 139 mEq/L (ref 135–145)
Total Bilirubin: 0.5 mg/dL (ref 0.3–1.2)
Total Protein: 6.6 g/dL (ref 6.0–8.3)

## 2011-01-25 LAB — CANCER ANTIGEN 27.29: CA 27.29: 171 U/mL — ABNORMAL HIGH (ref 0–39)

## 2011-03-01 ENCOUNTER — Ambulatory Visit (HOSPITAL_BASED_OUTPATIENT_CLINIC_OR_DEPARTMENT_OTHER)
Admission: RE | Admit: 2011-03-01 | Discharge: 2011-03-01 | Disposition: A | Discharge status: Home / Self Care | Payer: Medicare Other | Source: Ambulatory Visit | Attending: Hematology & Oncology | Admitting: Hematology & Oncology

## 2011-03-01 ENCOUNTER — Other Ambulatory Visit: Payer: Self-pay | Admitting: Hematology & Oncology

## 2011-03-01 ENCOUNTER — Encounter (HOSPITAL_BASED_OUTPATIENT_CLINIC_OR_DEPARTMENT_OTHER): Payer: Medicare Other | Admitting: Hematology & Oncology

## 2011-03-01 DIAGNOSIS — R05 Cough: Secondary | ICD-10-CM

## 2011-03-01 DIAGNOSIS — C7952 Secondary malignant neoplasm of bone marrow: Secondary | ICD-10-CM

## 2011-03-01 DIAGNOSIS — Z853 Personal history of malignant neoplasm of breast: Secondary | ICD-10-CM | POA: Insufficient documentation

## 2011-03-01 DIAGNOSIS — R059 Cough, unspecified: Secondary | ICD-10-CM | POA: Insufficient documentation

## 2011-03-01 DIAGNOSIS — C50119 Malignant neoplasm of central portion of unspecified female breast: Secondary | ICD-10-CM

## 2011-03-01 LAB — CBC WITH DIFFERENTIAL (CANCER CENTER ONLY)
BASO#: 0 10*3/uL (ref 0.0–0.2)
BASO%: 0.2 % (ref 0.0–2.0)
HCT: 40 % (ref 34.8–46.6)
HGB: 13.5 g/dL (ref 11.6–15.9)
LYMPH%: 34.7 % (ref 14.0–48.0)
MCV: 95 fL (ref 81–101)
MONO#: 0.6 10*3/uL (ref 0.1–0.9)
NEUT%: 58.4 % (ref 39.6–80.0)
RDW: 12 % (ref 11.1–15.7)
WBC: 10.3 10*3/uL — ABNORMAL HIGH (ref 3.9–10.0)

## 2011-03-01 LAB — COMPREHENSIVE METABOLIC PANEL
ALT: 12 U/L (ref 0–35)
AST: 19 U/L (ref 0–37)
Albumin: 4.3 g/dL (ref 3.5–5.2)
Calcium: 9.3 mg/dL (ref 8.4–10.5)
Chloride: 103 mEq/L (ref 96–112)
Potassium: 3.6 mEq/L (ref 3.5–5.3)
Sodium: 142 mEq/L (ref 135–145)

## 2011-03-26 ENCOUNTER — Ambulatory Visit (INDEPENDENT_AMBULATORY_CARE_PROVIDER_SITE_OTHER): Payer: Medicare Other | Admitting: Family Medicine

## 2011-03-26 ENCOUNTER — Encounter: Payer: Self-pay | Admitting: Family Medicine

## 2011-03-26 VITALS — BP 130/80 | HR 107 | Temp 97.9°F | Ht <= 58 in | Wt 121.0 lb

## 2011-03-26 DIAGNOSIS — R062 Wheezing: Secondary | ICD-10-CM

## 2011-03-26 DIAGNOSIS — J40 Bronchitis, not specified as acute or chronic: Secondary | ICD-10-CM

## 2011-03-26 MED ORDER — ALBUTEROL SULFATE HFA 108 (90 BASE) MCG/ACT IN AERS: 2.0000 | INHALATION_SPRAY | Freq: Four times a day (QID) | RESPIRATORY_TRACT | Status: DC | PRN

## 2011-03-26 MED ORDER — AZITHROMYCIN 250 MG PO TABS: ORAL_TABLET | ORAL | Status: DC

## 2011-03-26 NOTE — Patient Instructions (Signed)
Bronchitis Bronchitis is the body's way of reacting to injury and/or infection (inflammation) of the bronchi. Bronchi are the air tubes that extend from the windpipe into the lungs. If the inflammation becomes severe, it may cause shortness of breath.  CAUSES Inflammation may be caused by:  A virus.   Germs (bacteria).   Dust.   Allergens.   Pollutants and many other irritants.  The cells lining the bronchial tree are covered with tiny hairs (cilia). These constantly beat upward, away from the lungs, toward the mouth. This keeps the lungs free of pollutants. When these cells become too irritated and are unable to do their job, mucus begins to develop. This causes the characteristic cough of bronchitis. The cough clears the lungs when the cilia are unable to do their job. Without either of these protective mechanisms, the mucus would settle in the lungs. Then you would develop pneumonia. Smoking is a common cause of bronchitis and can contribute to pneumonia. Stopping this habit is the single most important thing you can do to help yourself. TREATMENT  Your caregiver may prescribe an antibiotic if the cough is caused by bacteria. Also, medicines that open up your airways make it easier to breathe. Your caregiver may also recommend or prescribe an expectorant. It will loosen the mucus to be coughed up. Only take over-the-counter or prescription medicines for pain, discomfort, or fever as directed by your caregiver.   Removing whatever causes the problem (smoking, for example) is critical to preventing the problem from getting worse.   Cough suppressants may be prescribed for relief of cough symptoms.   Inhaled medicines may be prescribed to help with symptoms now and to help prevent problems from returning.   For those with recurrent (chronic) bronchitis, there may be a need for steroid medicines.  SEEK IMMEDIATE MEDICAL CARE IF:  During treatment, you develop more pus-like mucus  (purulent sputum).   You or your child has an oral temperature above 100.4, not controlled by medicine.   Your baby is older than 3 months with a rectal temperature of 102 F (38.9 C) or higher.   Your baby is 3 months old or younger with a rectal temperature of 100.4 F (38 C) or higher.   You become progressively more ill.   You have increased difficulty breathing, wheezing, or shortness of breath.  It is necessary to seek immediate medical care if you are elderly or sick from any other disease. MAKE SURE YOU:  Understand these instructions.   Will watch your condition.   Will get help right away if you are not doing well or get worse.  Document Released: 05/27/2005 Document Re-Released: 08/21/2009 [employer] Patient Information 2011 [employer]. 

## 2011-03-26 NOTE — Progress Notes (Signed)
  Subjective:     Anna Benjamin is a 75 y.o. female here for evaluation of a cough. Onset of symptoms was 6 days ago. Symptoms have been unchanged since that time. The cough is productive of clear sputum and is aggravated by nothing. Associated symptoms include: postnasal drip, sputum production and wheezing. Patient does not have a history of asthma. Patient does not have a history of environmental allergens. Patient has not traveled recently. Patient does not have a history of smoking. Patient has had a previous chest x-ray. Patient has not had a PPD done.  The following portions of the patient's history were reviewed and updated as appropriate: allergies, current medications, past family history, past medical history, past social history, past surgical history and problem list.  Review of Systems Pertinent items are noted in HPI.    Objective:    Oxygen saturation 97% on room air BP 130/80  Pulse 107  Temp(Src) 97.9 F (36.6 C) (Oral)  Ht 4' 2.75" (1.289 m)  Wt 121 lb (54.885 kg)  BMI 33.03 kg/m2  SpO2 97% General appearance: alert, cooperative, appears stated age and no distress Ears: normal TM's and external ear canals both ears Nose: Nares normal. Septum midline. Mucosa normal. No drainage or sinus tenderness. Throat: lips, mucosa, and tongue normal; teeth and gums normal Neck: mild anterior cervical adenopathy, supple, symmetrical, trachea midline and thyroid not enlarged, symmetric, no tenderness/mass/nodules Lungs: diminished breath sounds bibasilar and wheezes bilaterally Heart: S1, S2 normal Extremities: extremities normal, atraumatic, no cyanosis or edema    Assessment:    Acute Bronchitis    Plan:    Explained lack of efficacy of antibiotics in viral disease. Antitussives per medication orders. Avoid exposure to tobacco smoke and fumes. B-agonist inhaler. Call if shortness of breath worsens, blood in sputum, change in character of cough, development of fever or  chills, inability to maintain nutrition and hydration. Avoid exposure to tobacco smoke and fumes.

## 2011-04-01 ENCOUNTER — Telehealth: Payer: Self-pay

## 2011-04-01 ENCOUNTER — Other Ambulatory Visit: Payer: Self-pay | Admitting: Hematology & Oncology

## 2011-04-01 ENCOUNTER — Other Ambulatory Visit: Payer: Self-pay | Admitting: Family

## 2011-04-01 ENCOUNTER — Encounter (HOSPITAL_BASED_OUTPATIENT_CLINIC_OR_DEPARTMENT_OTHER): Payer: Medicare Other | Admitting: Hematology & Oncology

## 2011-04-01 DIAGNOSIS — J40 Bronchitis, not specified as acute or chronic: Secondary | ICD-10-CM

## 2011-04-01 DIAGNOSIS — M545 Low back pain: Secondary | ICD-10-CM

## 2011-04-01 DIAGNOSIS — C50919 Malignant neoplasm of unspecified site of unspecified female breast: Secondary | ICD-10-CM

## 2011-04-01 DIAGNOSIS — C50119 Malignant neoplasm of central portion of unspecified female breast: Secondary | ICD-10-CM

## 2011-04-01 DIAGNOSIS — R220 Localized swelling, mass and lump, head: Secondary | ICD-10-CM

## 2011-04-01 DIAGNOSIS — C7951 Secondary malignant neoplasm of bone: Secondary | ICD-10-CM

## 2011-04-01 LAB — BASIC METABOLIC PANEL
BUN: 20 mg/dL (ref 6–23)
CO2: 23 mEq/L (ref 19–32)
Chloride: 105 mEq/L (ref 96–112)
Creatinine, Ser: 0.74 mg/dL (ref 0.50–1.10)

## 2011-04-01 MED ORDER — AZITHROMYCIN 250 MG PO TABS: ORAL_TABLET | ORAL | Status: AC

## 2011-04-01 NOTE — Telephone Encounter (Signed)
msg from patient and she stated that she was seen last week and treated with Abx and feels better, she still has the swelling in her neck, but it is no longer sore, still having cough with some clear phelgm but wants another round of the Z-pak to be sure it is completely gone. Please advise    KP

## 2011-04-01 NOTE — Telephone Encounter (Signed)
Refill x1 

## 2011-04-01 NOTE — Telephone Encounter (Signed)
Patient aware Rx faxed.     KP 

## 2011-04-03 ENCOUNTER — Other Ambulatory Visit (HOSPITAL_BASED_OUTPATIENT_CLINIC_OR_DEPARTMENT_OTHER): Payer: Medicare Other

## 2011-04-03 ENCOUNTER — Encounter: Payer: Self-pay | Admitting: *Deleted

## 2011-04-10 ENCOUNTER — Other Ambulatory Visit: Payer: Self-pay | Admitting: Hematology & Oncology

## 2011-04-10 ENCOUNTER — Encounter (HOSPITAL_COMMUNITY)
Admission: RE | Admit: 2011-04-10 | Discharge: 2011-04-10 | Disposition: A | Discharge status: Home / Self Care | Payer: Medicare Other | Source: Ambulatory Visit | Attending: Hematology & Oncology | Admitting: Hematology & Oncology

## 2011-04-10 ENCOUNTER — Encounter (HOSPITAL_COMMUNITY): Payer: Self-pay

## 2011-04-10 DIAGNOSIS — K7689 Other specified diseases of liver: Secondary | ICD-10-CM | POA: Insufficient documentation

## 2011-04-10 DIAGNOSIS — M545 Low back pain, unspecified: Secondary | ICD-10-CM | POA: Insufficient documentation

## 2011-04-10 DIAGNOSIS — R220 Localized swelling, mass and lump, head: Secondary | ICD-10-CM | POA: Insufficient documentation

## 2011-04-10 DIAGNOSIS — C50919 Malignant neoplasm of unspecified site of unspecified female breast: Secondary | ICD-10-CM

## 2011-04-10 DIAGNOSIS — C7951 Secondary malignant neoplasm of bone: Secondary | ICD-10-CM | POA: Insufficient documentation

## 2011-04-10 DIAGNOSIS — R599 Enlarged lymph nodes, unspecified: Secondary | ICD-10-CM | POA: Insufficient documentation

## 2011-04-10 MED ORDER — FLUDEOXYGLUCOSE F - 18 (FDG) INJECTION
19.0000 | Freq: Once | INTRAVENOUS | Status: AC | PRN
  Administered 2011-04-10: 19 via INTRAVENOUS

## 2011-04-15 ENCOUNTER — Ambulatory Visit (HOSPITAL_BASED_OUTPATIENT_CLINIC_OR_DEPARTMENT_OTHER): Payer: Medicare Other | Admitting: Hematology & Oncology

## 2011-04-15 VITALS — BP 144/81 | HR 88 | Temp 97.0°F | Wt 120.0 lb

## 2011-04-15 DIAGNOSIS — C50119 Malignant neoplasm of central portion of unspecified female breast: Secondary | ICD-10-CM

## 2011-04-15 DIAGNOSIS — C50919 Malignant neoplasm of unspecified site of unspecified female breast: Secondary | ICD-10-CM

## 2011-04-15 DIAGNOSIS — Z5111 Encounter for antineoplastic chemotherapy: Secondary | ICD-10-CM

## 2011-04-15 DIAGNOSIS — C7952 Secondary malignant neoplasm of bone marrow: Secondary | ICD-10-CM

## 2011-04-15 DIAGNOSIS — Z170 Estrogen receptor positive status [ER+]: Secondary | ICD-10-CM

## 2011-04-15 MED ORDER — FULVESTRANT 250 MG/5ML IM SOLN
500.0000 mg | INTRAMUSCULAR | Status: DC
  Administered 2011-04-15: 500 mg via INTRAMUSCULAR

## 2011-04-15 NOTE — Progress Notes (Signed)
Addended by: Alyson Locket N on: 04/15/2011 01:14 PM   Modules accepted: Orders

## 2011-04-15 NOTE — Progress Notes (Signed)
CSN: [id number] Anna Benjamin 04/15/2011 This office note has been dictated.

## 2011-04-15 NOTE — Progress Notes (Signed)
CC:   Anna Perla, DO  DIAGNOSIS:  Progressive metastatic breast cancer, hormone receptor positive.  CURRENT THERAPY: 1. The patient just started Faslodex today 500 mg IM every month. 2. Zometa 4 mg IV monthly.  INTERIM HISTORY:  Anna Benjamin comes in for followup.  Unfortunately, her cancer is progressing.  She noticed a lump on her left neck.  This has improved.  She did have a PET scan done.  This was done on 04/10/2011.  The PET scan did show a small hypermetabolic focus in the left neck.  She had multiple bony lesions.  Unfortunately, she also was noted have a 2.2 cm liver lesion that was hypermetabolic.  She has been having an elevated CA 27.29 for the past 4 months.  She has been asymptomatic.  Her last level was 307.  She has been working.  She is having no problems with cough or shortness breath.  There has been no increase in  bony pain.  She is on Vicodin (5/325 mg) for pain.  The patient only takes 4 to 5 a day.  She has not had any problems with bowels or bladder.  There has been no headache.  For Anna Benjamin, quality of life has always been her primary goal.  She has wanted "shy away" from chemotherapy.  She says when she takes chemotherapy, "my life is over."  She has had no fever.  There has been no bleeding.  PHYSICAL EXAMINATION:  General:  This is a petite white female in no obvious distress.  Vital Signs:  Temperature 97, pulse 88, respiratory rate 16, blood pressure 144/81.  Head/Neck:  A normocephalic, atraumatic skull.  There are no ocular or oral lesions.  There are no palpable cervical lymph nodes on the right neck.  She may have a slight fullness in the mid left neck.  There is no tenderness to palpation over the left neck.  There are no supraclavicular lymph nodes.  Lungs:  Clear bilaterally.  Cardiac:  Regular rate and rhythm with a normal S1, S2. There are no murmurs, rubs or bruits.  Abdomen: Soft with good bowel sounds.  There is no  palpable abdominal mass.  There is no fluid wave. There is no palpable hepatosplenomegaly.  Back:  Exam shows some kyphosis.  No tenderness is noted over the spine, ribs, or hips. Extremities:  No clubbing, cyanosis or edema.  Neurologic:  No focal neurological deficit.  LABORATORIES:  Pending.  IMPRESSION:  Anna Benjamin is a 75 year old white female with metastatic breast cancer.  We have been following her now for a year and a half. She has done well.  I suppose that it is no surprise at that her cancer is progressing.  We will go ahead and try her on Faslodex.  This is certainly reasonable.  We will also continue her on Zometa.  She is not due for Zometa for another couple weeks.  We will go ahead and plan to get Anna Benjamin to see [place] in another 6 weeks or so for followup.  We spent a good 45 minutes with Anna Benjamin today.  Her son was with her.  I answered all of his questions.    ______________________________ Josph Macho, M.D. PRE/MEDQ  D:  04/15/2011  T:  04/15/2011  Job:  [id number]

## 2011-04-16 ENCOUNTER — Telehealth: Payer: Self-pay | Admitting: *Deleted

## 2011-04-16 NOTE — Telephone Encounter (Addendum)
Pt called wanting to know if she should continue taking Aromasin. She couldn't remember from her visit yesterday. Reviewed with Dr Myna Hidalgo. To stop Aromasin. Explained to the pt that the shots (Faslodex) is taking the place of the Aromasin. She verbalized understanding.

## 2011-04-29 ENCOUNTER — Other Ambulatory Visit: Payer: Self-pay | Admitting: Hematology & Oncology

## 2011-04-29 ENCOUNTER — Other Ambulatory Visit: Payer: Self-pay | Admitting: Family

## 2011-04-29 ENCOUNTER — Ambulatory Visit (HOSPITAL_BASED_OUTPATIENT_CLINIC_OR_DEPARTMENT_OTHER): Payer: Medicare Other

## 2011-04-29 DIAGNOSIS — C50119 Malignant neoplasm of central portion of unspecified female breast: Secondary | ICD-10-CM

## 2011-04-29 DIAGNOSIS — Z5111 Encounter for antineoplastic chemotherapy: Secondary | ICD-10-CM

## 2011-04-29 DIAGNOSIS — C50919 Malignant neoplasm of unspecified site of unspecified female breast: Secondary | ICD-10-CM

## 2011-04-29 DIAGNOSIS — C7952 Secondary malignant neoplasm of bone marrow: Secondary | ICD-10-CM

## 2011-04-29 MED ORDER — ZOLEDRONIC ACID 4 MG/5ML IV CONC: 4.0000 mg | Freq: Once | INTRAVENOUS | Status: DC

## 2011-04-29 MED ORDER — ZOLEDRONIC ACID 4 MG/100ML IV SOLN
4.0000 mg | Freq: Once | INTRAVENOUS | Status: AC
  Administered 2011-04-29: 4 mg via INTRAVENOUS
  Filled 2011-04-29: qty 100

## 2011-04-29 MED ORDER — FULVESTRANT 250 MG/5ML IM SOLN
500.0000 mg | Freq: Once | INTRAMUSCULAR | Status: AC
  Administered 2011-04-29: 500 mg via INTRAMUSCULAR
  Filled 2011-04-29: qty 10

## 2011-05-01 ENCOUNTER — Other Ambulatory Visit: Payer: Self-pay | Admitting: Family Medicine

## 2011-05-09 ENCOUNTER — Encounter: Payer: Self-pay | Admitting: Family Medicine

## 2011-05-09 ENCOUNTER — Ambulatory Visit (INDEPENDENT_AMBULATORY_CARE_PROVIDER_SITE_OTHER): Payer: Medicare Other | Admitting: Family Medicine

## 2011-05-09 VITALS — BP 125/80 | HR 70 | Temp 97.9°F | Ht <= 58 in | Wt 119.0 lb

## 2011-05-09 DIAGNOSIS — J40 Bronchitis, not specified as acute or chronic: Secondary | ICD-10-CM

## 2011-05-09 MED ORDER — FULVESTRANT 250 MG/5ML IM SOLN: 250.0000 mg | INTRAMUSCULAR | Status: DC

## 2011-05-09 MED ORDER — FLUTICASONE PROPIONATE 50 MCG/ACT NA SUSP: 2.0000 | Freq: Every day | NASAL | Status: DC

## 2011-05-09 MED ORDER — BENZONATATE 200 MG PO CAPS: 200.0000 mg | ORAL_CAPSULE | Freq: Three times a day (TID) | ORAL | Status: AC | PRN

## 2011-05-09 MED ORDER — AMOXICILLIN 500 MG PO CAPS: 500.0000 mg | ORAL_CAPSULE | Freq: Two times a day (BID) | ORAL | Status: AC

## 2011-05-09 NOTE — Progress Notes (Signed)
  Subjective:    Patient ID: Anna Benjamin, female    DOB: [birth year], 75 y.o.   MRN: [id number]  HPI Cough- sxs started Saturday morning 'like a cold'.  Taking Airborne and Mucinex.  Now w/ nasal congestion, nonproductive cough.  Has hx of bronchitis.  No fever.  No facial pain/pressure, ear pain.  + sick contacts.  Currently in treatment for breast cancer.   Review of Systems For ROS see HPI     Objective:   Physical Exam  Vitals reviewed. Constitutional: She appears well-developed and well-nourished. No distress.  HENT:  Head: Normocephalic and atraumatic.       TMs normal bilaterally Mild nasal congestion Throat w/out erythema, edema, or exudate  Eyes: Conjunctivae and EOM are normal. Pupils are equal, round, and reactive to light.  Neck: Normal range of motion. Neck supple.  Cardiovascular: Normal rate, regular rhythm, normal heart sounds and intact distal pulses.   No murmur heard. Pulmonary/Chest: Effort normal and breath sounds normal. No respiratory distress. She has no wheezes.       + hacking cough  Lymphadenopathy:    She has no cervical adenopathy.          Assessment & Plan:

## 2011-05-09 NOTE — Patient Instructions (Signed)
Take the Amoxicillin for the bronchitis- take w/ food to avoid upset stomach Continue the Mucinex to thin your congestion Use the Tessalon as needed for cough Drink plenty of fluids REST! Hang in there! Happy Holidays!

## 2011-05-12 NOTE — Assessment & Plan Note (Signed)
Given that pt is actively undergoing cancer treatment will start abx.  Reviewed supportive care and red flags that should prompt return. Pt expressed understanding and is in agreement w/ plan.

## 2011-05-13 ENCOUNTER — Other Ambulatory Visit: Payer: Self-pay | Admitting: *Deleted

## 2011-05-13 DIAGNOSIS — C50919 Malignant neoplasm of unspecified site of unspecified female breast: Secondary | ICD-10-CM

## 2011-05-13 MED ORDER — ESZOPICLONE 3 MG PO TABS: 3.0000 mg | ORAL_TABLET | Freq: Every day | ORAL | Status: DC

## 2011-05-27 ENCOUNTER — Ambulatory Visit: Payer: Medicare Other

## 2011-05-27 ENCOUNTER — Encounter: Payer: Medicare Other | Admitting: Hematology & Oncology

## 2011-05-27 ENCOUNTER — Ambulatory Visit (HOSPITAL_BASED_OUTPATIENT_CLINIC_OR_DEPARTMENT_OTHER): Payer: Medicare Other | Admitting: Family

## 2011-05-27 ENCOUNTER — Encounter: Payer: Self-pay | Admitting: Family

## 2011-05-27 ENCOUNTER — Other Ambulatory Visit (HOSPITAL_BASED_OUTPATIENT_CLINIC_OR_DEPARTMENT_OTHER): Payer: Medicare Other | Admitting: Lab

## 2011-05-27 ENCOUNTER — Other Ambulatory Visit: Payer: Self-pay | Admitting: *Deleted

## 2011-05-27 ENCOUNTER — Other Ambulatory Visit: Payer: Medicare Other | Admitting: Lab

## 2011-05-27 VITALS — BP 134/73 | HR 74 | Temp 97.6°F | Wt 120.0 lb

## 2011-05-27 DIAGNOSIS — C50119 Malignant neoplasm of central portion of unspecified female breast: Secondary | ICD-10-CM

## 2011-05-27 DIAGNOSIS — R059 Cough, unspecified: Secondary | ICD-10-CM

## 2011-05-27 DIAGNOSIS — C50919 Malignant neoplasm of unspecified site of unspecified female breast: Secondary | ICD-10-CM

## 2011-05-27 DIAGNOSIS — Z5111 Encounter for antineoplastic chemotherapy: Secondary | ICD-10-CM

## 2011-05-27 DIAGNOSIS — C787 Secondary malignant neoplasm of liver and intrahepatic bile duct: Secondary | ICD-10-CM

## 2011-05-27 DIAGNOSIS — R05 Cough: Secondary | ICD-10-CM

## 2011-05-27 DIAGNOSIS — C7952 Secondary malignant neoplasm of bone marrow: Secondary | ICD-10-CM

## 2011-05-27 DIAGNOSIS — C7951 Secondary malignant neoplasm of bone: Secondary | ICD-10-CM

## 2011-05-27 LAB — CBC WITH DIFFERENTIAL (CANCER CENTER ONLY)
Eosinophils Absolute: 0.1 10*3/uL (ref 0.0–0.5)
MONO#: 0.7 10*3/uL (ref 0.1–0.9)
NEUT#: 6.5 10*3/uL (ref 1.5–6.5)
Platelets: 217 10*3/uL (ref 145–400)
RBC: 4.22 10*6/uL (ref 3.70–5.32)
WBC: 12 10*3/uL — ABNORMAL HIGH (ref 3.9–10.0)

## 2011-05-27 MED ORDER — BENZONATATE 100 MG PO CAPS: 100.0000 mg | ORAL_CAPSULE | Freq: Three times a day (TID) | ORAL | Status: AC | PRN

## 2011-05-27 MED ORDER — ZOLEDRONIC ACID 4 MG/100ML IV SOLN
4.0000 mg | Freq: Once | INTRAVENOUS | Status: AC
  Administered 2011-05-27: 4 mg via INTRAVENOUS
  Filled 2011-05-27: qty 100

## 2011-05-27 MED ORDER — FULVESTRANT 250 MG/5ML IM SOLN
500.0000 mg | INTRAMUSCULAR | Status: AC
  Administered 2011-05-27: 500 mg via INTRAMUSCULAR
  Filled 2011-05-27: qty 10

## 2011-05-27 NOTE — Telephone Encounter (Signed)
RN aware pt does not have MD follow up appointment orders

## 2011-05-27 NOTE — Progress Notes (Signed)
[facility]  Name: Anna Benjamin                  DATE: 05/27/2011 MRN: [id number]                      DOB: Aug 18, 1935  CC: Anna Freud, DO, DO           REFERRING PHYSICIAN: Lelon Perla, DO  DIAGNOSIS: Patient Active Problem List  Diagnoses Date Noted  . Bronchitis 05/09/2011  . URI 07/05/2010  . METASTATIC BREAST CANCER 05/02/2010  . OTHER MYELOPATHY 10/02/2009  . LOW BACK PAIN, ACUTE 09/28/2009  . HYPERTENSION 09/15/2006  . OSTEOPOROSIS 09/15/2006  . FX CLOSED HUMERUS NOS 09/15/2006  . FRACTURE, INDEX FINGER, RIGHT 09/15/2006  . HIP FRACTURE, LEFT 09/15/2006  . BLADDER REPAIR, HX OF 09/15/2006     Encounter Diagnosis  Name Primary?  . Breast cancer metastasized to multiple sites Yes    CURRENT THERAPY: Faslodex, Zometa  INTERIM HISTORY: Pt comes in for scheduled Faslodex injection and Zometa treatment. Accompanied by her son. Continues to do surprisingly well, working 32 hours/week. Had recent URI, continues to have chronic cough. Bone pain well controlled with 4-5 Norco 5/325 mg daily. Appetite is good. Bowel and bladder function normal.   PHYSICAL EXAM: BP 134/73  Pulse 74  Temp 97.6 F (36.4 C)  Wt 120 lb (54.432 kg) General: Well developed, well nourished, in no acute distress.  EENT: No ocular or oral lesions. No stomatitis. Palpable node, left submental neck, 1 cm, firm.  Respiratory: Lungs are clear to auscultation bilaterally with normal respiratory movement and no accessory muscle use. Cardiac: No murmur, rub or tachycardia. No upper or lower extremity edema.  GI: Abdomen is soft, no palpable hepatosplenomegaly. No fluid wave. No tenderness. Musculoskeletal: Kyphosis, mild tenderness over the spine with palpation.  Lymph: No cervical, infraclavicular, axillary or inguinal adenopathy. Neuro: No focal neurological deficits. Psych: Alert and oriented X 3, appropriate mood and affect.   SOCIAL HISTORY:   . Marital Status: Single    Occupational History  . School bus driver   Social History Main Topics  . Smoking status: Never Smoker   . Smokeless tobacco: Never Used  . Alcohol Use: No  . Drug Use: No  . Sexually Active: No   LABORATORY STUDIES:   Results for orders placed in visit on 05/27/11  CBC WITH DIFFERENTIAL ([facility])      Component Value Range   WBC 12.0 (*) 3.9 - 10.0 (10e3/uL)   RBC 4.22  3.70 - 5.32 (10e6/uL)   HGB 13.2  11.6 - 15.9 (g/dL)   HCT 78.2  95.6 - 21.3 (%)   MCV 96  81 - 101 (fL)   MCH 31.3  26.0 - 34.0 (pg)   MCHC 32.8  32.0 - 36.0 (g/dL)   RDW 08.6  57.8 - 46.9 (%)   Platelets 217  145 - 400 (10e3/uL)   NEUT# 6.5  1.5 - 6.5 (10e3/uL)   LYMPH# 4.7 (*) 0.9 - 3.3 (10e3/uL)   MONO# 0.7  0.1 - 0.9 (10e3/uL)   Eosinophils Absolute 0.1  0.0 - 0.5 (10e3/uL)   BASO# 0.0  0.0 - 0.2 (10e3/uL)   NEUT% 54.5  39.6 - 80.0 (%)   LYMPH% 38.8  14.0 - 48.0 (%)   MONO% 6.1  0.0 - 13.0 (%)   EOS% 0.4  0.0 - 7.0 (%)   BASO% 0.2  0.0 - 2.0 (%)  CA 27-29  pending.   IMPRESSION:  75 y/o white female with:  1. Neglected left breast cancer with liver and bone metastases 2. Recent URI, residual cough.  3. Pain well controlled with current prescription.   PLAN:   1. Treatment today, Faslodex and Zometa 2. Return to clinic in 1 month for treatment with same.  3. Prescription for Tessalon Perles for cough.   DISCUSSION: I let her know that a chronic cough is worrisome in a patient with documented distant metastases from a neglected breast cancer. If the cough is not improved on her next visit, may be prudent to order a chest x-ray to rule out lung mets.

## 2011-06-17 ENCOUNTER — Telehealth: Payer: Self-pay | Admitting: *Deleted

## 2011-06-17 NOTE — Telephone Encounter (Signed)
Pt moved 1-14 to 1-21 and I scheduled her to see MD 1-30 pt aware

## 2011-06-24 ENCOUNTER — Ambulatory Visit: Payer: Medicare Other

## 2011-06-25 ENCOUNTER — Other Ambulatory Visit: Payer: Self-pay | Admitting: *Deleted

## 2011-06-25 DIAGNOSIS — C50919 Malignant neoplasm of unspecified site of unspecified female breast: Secondary | ICD-10-CM

## 2011-06-25 MED ORDER — ESZOPICLONE 3 MG PO TABS: 3.0000 mg | ORAL_TABLET | Freq: Every day | ORAL | Status: DC

## 2011-06-25 NOTE — Telephone Encounter (Signed)
Received refill request from [facility] for Lunesta 3 mg 1 tab qhs #30 last refilled on 05/13/11. Pt takes this medication on a regular basis.

## 2011-06-27 ENCOUNTER — Other Ambulatory Visit: Payer: Self-pay | Admitting: *Deleted

## 2011-06-27 DIAGNOSIS — C50919 Malignant neoplasm of unspecified site of unspecified female breast: Secondary | ICD-10-CM

## 2011-06-27 MED ORDER — HYDROCODONE-ACETAMINOPHEN 10-325 MG PO TABS: ORAL_TABLET | ORAL | Status: DC

## 2011-06-27 NOTE — Telephone Encounter (Signed)
Received a refill request from [facility] for Norco 10/325 mg. Refilled per package instructions. Will faxed back to [facility] once approved by Dr Myna Hidalgo.

## 2011-07-01 ENCOUNTER — Ambulatory Visit: Payer: Medicare Other | Admitting: Hematology & Oncology

## 2011-07-01 ENCOUNTER — Other Ambulatory Visit: Payer: Medicare Other | Admitting: Lab

## 2011-07-01 ENCOUNTER — Ambulatory Visit (HOSPITAL_BASED_OUTPATIENT_CLINIC_OR_DEPARTMENT_OTHER): Payer: Medicare Other

## 2011-07-01 VITALS — BP 126/68 | HR 65 | Temp 97.9°F

## 2011-07-01 DIAGNOSIS — Z5111 Encounter for antineoplastic chemotherapy: Secondary | ICD-10-CM

## 2011-07-01 DIAGNOSIS — C787 Secondary malignant neoplasm of liver and intrahepatic bile duct: Secondary | ICD-10-CM

## 2011-07-01 DIAGNOSIS — C7951 Secondary malignant neoplasm of bone: Secondary | ICD-10-CM

## 2011-07-01 DIAGNOSIS — C50119 Malignant neoplasm of central portion of unspecified female breast: Secondary | ICD-10-CM

## 2011-07-01 DIAGNOSIS — C7952 Secondary malignant neoplasm of bone marrow: Secondary | ICD-10-CM

## 2011-07-01 DIAGNOSIS — C50919 Malignant neoplasm of unspecified site of unspecified female breast: Secondary | ICD-10-CM

## 2011-07-01 MED ORDER — FULVESTRANT 250 MG/5ML IM SOLN
500.0000 mg | INTRAMUSCULAR | Status: DC
  Administered 2011-07-01: 500 mg via INTRAMUSCULAR
  Filled 2011-07-01: qty 10

## 2011-07-01 MED ORDER — ZOLEDRONIC ACID 4 MG/100ML IV SOLN
4.0000 mg | Freq: Once | INTRAVENOUS | Status: AC
  Administered 2011-07-01: 4 mg via INTRAVENOUS
  Filled 2011-07-01: qty 100

## 2011-07-02 ENCOUNTER — Other Ambulatory Visit: Payer: Self-pay | Admitting: Pharmacist

## 2011-07-02 DIAGNOSIS — C50919 Malignant neoplasm of unspecified site of unspecified female breast: Secondary | ICD-10-CM

## 2011-07-10 ENCOUNTER — Ambulatory Visit (HOSPITAL_BASED_OUTPATIENT_CLINIC_OR_DEPARTMENT_OTHER): Payer: Medicare Other | Admitting: Hematology & Oncology

## 2011-07-10 ENCOUNTER — Other Ambulatory Visit (HOSPITAL_BASED_OUTPATIENT_CLINIC_OR_DEPARTMENT_OTHER): Payer: Medicare Other | Admitting: Lab

## 2011-07-10 ENCOUNTER — Telehealth: Payer: Self-pay | Admitting: Hematology & Oncology

## 2011-07-10 VITALS — BP 134/72 | HR 87 | Temp 98.4°F | Ht <= 58 in | Wt 117.0 lb

## 2011-07-10 DIAGNOSIS — C7952 Secondary malignant neoplasm of bone marrow: Secondary | ICD-10-CM

## 2011-07-10 DIAGNOSIS — C50919 Malignant neoplasm of unspecified site of unspecified female breast: Secondary | ICD-10-CM

## 2011-07-10 DIAGNOSIS — K7689 Other specified diseases of liver: Secondary | ICD-10-CM

## 2011-07-10 DIAGNOSIS — C50119 Malignant neoplasm of central portion of unspecified female breast: Secondary | ICD-10-CM

## 2011-07-10 DIAGNOSIS — C7951 Secondary malignant neoplasm of bone: Secondary | ICD-10-CM

## 2011-07-10 DIAGNOSIS — Z170 Estrogen receptor positive status [ER+]: Secondary | ICD-10-CM

## 2011-07-10 LAB — COMPREHENSIVE METABOLIC PANEL
Albumin: 4.5 g/dL (ref 3.5–5.2)
Alkaline Phosphatase: 83 U/L (ref 39–117)
CO2: 25 mEq/L (ref 19–32)
Glucose, Bld: 129 mg/dL — ABNORMAL HIGH (ref 70–99)
Potassium: 3.9 mEq/L (ref 3.5–5.3)
Sodium: 140 mEq/L (ref 135–145)
Total Protein: 6.6 g/dL (ref 6.0–8.3)

## 2011-07-10 LAB — CANCER ANTIGEN 27.29: CA 27.29: 419 U/mL — ABNORMAL HIGH (ref 0–39)

## 2011-07-10 LAB — CBC WITH DIFFERENTIAL (CANCER CENTER ONLY)
BASO%: 0.3 % (ref 0.0–2.0)
Eosinophils Absolute: 0.1 10*3/uL (ref 0.0–0.5)
MCH: 31.6 pg (ref 26.0–34.0)
MONO%: 6.3 % (ref 0.0–13.0)
NEUT#: 4.5 10*3/uL (ref 1.5–6.5)
Platelets: 241 10*3/uL (ref 145–400)
RBC: 4.05 10*6/uL (ref 3.70–5.32)
WBC: 7.7 10*3/uL (ref 3.9–10.0)

## 2011-07-10 NOTE — Progress Notes (Signed)
This office note has been dictated.

## 2011-07-10 NOTE — Telephone Encounter (Signed)
Pt aware of 2-18 being 2-19 due to MD slots open

## 2011-07-11 NOTE — Progress Notes (Signed)
CC:   Lelon Perla, DO  DIAGNOSIS:  Progressive metastatic breast cancer, ER positive.  CURRENT THERAPY: 1. Faslodex 500 mg IM every month. 2. Zometa 4 mg IM every month.  INTERIM HISTORY:  Ms. Peterkin comes in for her follow-up.  She is still working pretty much full time.  She does put in some long hours.  She has had no complaints of pain.  She takes Vicodin for pain.  She only takes 4-5 pills a day.  She has had no worsening issues with cough.  She has had no nausea and vomiting.  Her appetite does seem to come and go.  She has had no leg swelling.  She has had no headache.  There have been no problems visual wise.  She has not had any diarrhea.  There has been no constipation.  Her last CA 27.29 back in October was 307.  PHYSICAL EXAMINATION:  General Appearance:  This is a petite white female in no obvious distress.  Vital Signs:  Show a temperature of 98.4, pulse 87, respiratory rate 18, blood pressure 134/72.  Weight is 117.  Head and Neck Exam:  Shows a normocephalic, atraumatic skull. There are no ocular or oral lesions.  There are no palpable cervical or supraclavicular lymph nodes.  Lungs:  Clear bilaterally.  Cardiac Exam: Regular rate and rhythm with a normal S1 and S2.  There are no murmurs, rubs or bruits.  Abdominal Exam:  Soft with good bowel sounds.  There is no palpable abdominal mass.  There is no palpable hepatosplenomegaly. Back Exam:  No tenderness over the spine, ribs or hips.  She has some slight kyphosis.  Extremities:  Show no clubbing, cyanosis or edema. She has good range of motion of her joints.  Neurological Exam:  Shows no focal neurological deficits.  LABORATORY STUDIES:  Pending.  IMPRESSION:  Ms. Sandeen is a 76 year old white female with metastatic breast cancer.  So far, her disease pretty much has been confined to the bones.  She did have a PET scan back in October, which did show a new lesion in the liver that measured 2.2 cm.   This really prompted [place] to switch her therapies.  Ms. Kaufmann has not wanted to consider chemotherapy.  She just does not feel chemotherapy is to her benefit.  She is due for treatment on I think the 18th of February.  We will see her back then.    ______________________________ Josph Macho, M.D. PRE/MEDQ  D:  07/10/2011  T:  07/11/2011  Job:  [id number]  ADDENDUM:  CA 27.29 is 417.

## 2011-07-22 ENCOUNTER — Ambulatory Visit: Payer: Medicare Other

## 2011-07-30 ENCOUNTER — Ambulatory Visit (HOSPITAL_BASED_OUTPATIENT_CLINIC_OR_DEPARTMENT_OTHER): Payer: Medicare Other

## 2011-07-30 ENCOUNTER — Other Ambulatory Visit (HOSPITAL_BASED_OUTPATIENT_CLINIC_OR_DEPARTMENT_OTHER): Payer: Medicare Other | Admitting: Lab

## 2011-07-30 ENCOUNTER — Ambulatory Visit (HOSPITAL_BASED_OUTPATIENT_CLINIC_OR_DEPARTMENT_OTHER): Payer: Medicare Other | Admitting: Hematology & Oncology

## 2011-07-30 VITALS — BP 136/72 | HR 70 | Temp 98.4°F | Ht <= 58 in | Wt 117.0 lb

## 2011-07-30 DIAGNOSIS — C50919 Malignant neoplasm of unspecified site of unspecified female breast: Secondary | ICD-10-CM

## 2011-07-30 DIAGNOSIS — C7952 Secondary malignant neoplasm of bone marrow: Secondary | ICD-10-CM

## 2011-07-30 DIAGNOSIS — C787 Secondary malignant neoplasm of liver and intrahepatic bile duct: Secondary | ICD-10-CM

## 2011-07-30 DIAGNOSIS — C7951 Secondary malignant neoplasm of bone: Secondary | ICD-10-CM

## 2011-07-30 DIAGNOSIS — C50119 Malignant neoplasm of central portion of unspecified female breast: Secondary | ICD-10-CM

## 2011-07-30 DIAGNOSIS — Z5111 Encounter for antineoplastic chemotherapy: Secondary | ICD-10-CM

## 2011-07-30 LAB — CBC WITH DIFFERENTIAL (CANCER CENTER ONLY)
BASO%: 0.3 % (ref 0.0–2.0)
HCT: 39 % (ref 34.8–46.6)
LYMPH#: 5 10*3/uL — ABNORMAL HIGH (ref 0.9–3.3)
MONO#: 0.7 10*3/uL (ref 0.1–0.9)
NEUT#: 4.5 10*3/uL (ref 1.5–6.5)
NEUT%: 43.7 % (ref 39.6–80.0)
Platelets: 220 10*3/uL (ref 145–400)
RDW: 11.8 % (ref 11.1–15.7)
WBC: 10.3 10*3/uL — ABNORMAL HIGH (ref 3.9–10.0)

## 2011-07-30 LAB — COMPREHENSIVE METABOLIC PANEL
ALT: 13 U/L (ref 0–35)
AST: 18 U/L (ref 0–37)
Albumin: 4.3 g/dL (ref 3.5–5.2)
CO2: 24 mEq/L (ref 19–32)
Calcium: 9.2 mg/dL (ref 8.4–10.5)
Chloride: 105 mEq/L (ref 96–112)
Potassium: 3.8 mEq/L (ref 3.5–5.3)
Sodium: 138 mEq/L (ref 135–145)
Total Protein: 6 g/dL (ref 6.0–8.3)

## 2011-07-30 LAB — CANCER ANTIGEN 27.29: CA 27.29: 505 U/mL — ABNORMAL HIGH (ref 0–39)

## 2011-07-30 MED ORDER — ZOLEDRONIC ACID 4 MG/5ML IV CONC
4.0000 mg | Freq: Once | INTRAVENOUS | Status: AC
  Administered 2011-07-30: 4 mg via INTRAVENOUS
  Filled 2011-07-30: qty 5

## 2011-07-30 MED ORDER — FULVESTRANT 250 MG/5ML IM SOLN
500.0000 mg | INTRAMUSCULAR | Status: DC
  Administered 2011-07-30: 500 mg via INTRAMUSCULAR
  Filled 2011-07-30: qty 10

## 2011-07-30 NOTE — Progress Notes (Signed)
This office note has been dictated.
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
## 2011-08-07 ENCOUNTER — Other Ambulatory Visit: Payer: Self-pay | Admitting: *Deleted

## 2011-08-07 DIAGNOSIS — C50919 Malignant neoplasm of unspecified site of unspecified female breast: Secondary | ICD-10-CM

## 2011-08-07 MED ORDER — HYDROCODONE-ACETAMINOPHEN 10-325 MG PO TABS: ORAL_TABLET | ORAL | Status: DC

## 2011-08-07 NOTE — Telephone Encounter (Signed)
Received refill request from [facility] for hydrocodone/apap 10-325. Pt hasn't had it filled since 06/27/11. Routed to Dr Myna Hidalgo for approval.

## 2011-08-21 ENCOUNTER — Telehealth: Payer: Self-pay | Admitting: Hematology & Oncology

## 2011-08-21 ENCOUNTER — Other Ambulatory Visit (HOSPITAL_BASED_OUTPATIENT_CLINIC_OR_DEPARTMENT_OTHER): Payer: Medicare Other

## 2011-08-21 ENCOUNTER — Encounter (HOSPITAL_BASED_OUTPATIENT_CLINIC_OR_DEPARTMENT_OTHER): Payer: Self-pay

## 2011-08-21 ENCOUNTER — Emergency Department (HOSPITAL_BASED_OUTPATIENT_CLINIC_OR_DEPARTMENT_OTHER)
Admission: EM | Admit: 2011-08-21 | Discharge: 2011-08-21 | Disposition: A | Discharge status: Home / Self Care | Payer: Medicare Other | Attending: Emergency Medicine | Admitting: Emergency Medicine

## 2011-08-21 DIAGNOSIS — Z853 Personal history of malignant neoplasm of breast: Secondary | ICD-10-CM | POA: Insufficient documentation

## 2011-08-21 DIAGNOSIS — J40 Bronchitis, not specified as acute or chronic: Secondary | ICD-10-CM | POA: Insufficient documentation

## 2011-08-21 DIAGNOSIS — E785 Hyperlipidemia, unspecified: Secondary | ICD-10-CM | POA: Insufficient documentation

## 2011-08-21 DIAGNOSIS — I10 Essential (primary) hypertension: Secondary | ICD-10-CM | POA: Insufficient documentation

## 2011-08-21 DIAGNOSIS — M810 Age-related osteoporosis without current pathological fracture: Secondary | ICD-10-CM | POA: Insufficient documentation

## 2011-08-21 MED ORDER — AMOXICILLIN 500 MG PO CAPS
500.0000 mg | ORAL_CAPSULE | Freq: Once | ORAL | Status: AC
  Administered 2011-08-21: 500 mg via ORAL
  Filled 2011-08-21: qty 1

## 2011-08-21 MED ORDER — AMOXICILLIN 500 MG PO CAPS: 500.0000 mg | ORAL_CAPSULE | Freq: Three times a day (TID) | ORAL | Status: AC

## 2011-08-21 NOTE — Discharge Instructions (Signed)
Bronchitis Bronchitis is a problem of the air tubes leading to your lungs. This problem makes it hard for air to get in and out of the lungs. You may cough a lot because your air tubes are narrow. Going without care can cause lasting (chronic) bronchitis. HOME CARE   Drink enough fluids to keep your pee (urine) clear or pale yellow.   Use a cool mist humidifier.   Quit smoking if you smoke. If you keep smoking, the bronchitis might not get better.   Only take medicine as told by your doctor.  GET HELP RIGHT AWAY IF:   Coughing keeps you awake.   You start to wheeze.   You become more sick or weak.   You have a hard time breathing or get short of breath.   You cough up blood.   Coughing lasts more than 2 weeks.   You have a fever.   Your baby is older than 3 months with a rectal temperature of 102 F (38.9 C) or higher.   Your baby is 47 months old or younger with a rectal temperature of 100.4 F (38 C) or higher.  MAKE SURE YOU:  Understand these instructions.   Will watch your condition.   Will get help right away if you are not doing well or get worse.  Document Released: 11/13/2007 Document Revised: 05/16/2011 Document Reviewed: 04/28/2009 [facility] Patient Information 2012 [place], [place].   Keep your scheduled appointment with Dr. Hulan Saas tomorrow. Return if her condition worsens for any reason

## 2011-08-21 NOTE — ED Notes (Signed)
Pt reports cold symptoms, cough, and headache that started Sunday.  Symptoms unrelieved after taking OTC medications.

## 2011-08-21 NOTE — ED Provider Notes (Signed)
History     CSN: [id number]  Arrival date & time 08/21/11  1242   First MD Initiated Contact with Patient 08/21/11 1413      Chief Complaint  Patient presents with  . URI  . Cough  . Headache    (Consider location/radiation/quality/duration/timing/severity/associated sxs/prior treatment) HPI Complains of cough swollen "glands" in her throat and headache onset 3 days ago. Headache became worse today while coughing cough is dry she denies fever symptoms are similar to bronchitis she's had in the past. Denies shortness of breath Treated with hydrocodone with partial relief. Headache is diffuse and nonradiating worse with coughing improved with hydrocodone no other associated symptoms Past Medical History  Diagnosis Date  . Allergy   . Hyperlipidemia   . Hypertension   . Osteoporosis   . Breast cancer     Past Surgical History  Procedure Date  . Abdominal hysterectomy 1986  . Tonsillectomy     Family History  Problem Relation Age of Onset  . Coronary artery disease    . Cancer Mother     ? etiology  . Cancer Brother     ? etiology  . Arthritis    . Depression      History  Substance Use Topics  . Smoking status: Never Smoker   . Smokeless tobacco: Never Used  . Alcohol Use: No    OB History    Grav Para Term Preterm Abortions TAB SAB Ect Mult Living                  Review of Systems  Constitutional: Negative.   Respiratory: Positive for choking.   Cardiovascular: Negative.   Gastrointestinal: Negative.   Musculoskeletal: Negative.   Skin: Negative.   Neurological: Positive for headaches.  Hematological: Positive for adenopathy.  Psychiatric/Behavioral: Negative.   All other systems reviewed and are negative.    Allergies  Review of patient's allergies indicates no known allergies.  Home Medications   Current Outpatient Rx  Name Route Sig Dispense Refill  . ALBUTEROL SULFATE HFA 108 (90 BASE) MCG/ACT IN AERS Inhalation Inhale 2 puffs into  the lungs every 6 (six) hours as needed for wheezing.    . ASPIRIN 81 MG PO CHEW Oral Chew 81 mg by mouth daily.      . BECLOMETHASONE DIPROPIONATE 40 MCG/ACT IN AERS Inhalation Inhale 2 puffs into the lungs 2 (two) times daily.      Marland Kitchen VITAMIN D 1000 UNITS PO TABS Oral Take 1,000 Units by mouth daily.      Marland Kitchen DM-GUAIFENESIN ER 30-600 MG PO TB12 Oral Take 1 tablet by mouth every 12 (twelve) hours.      . ESZOPICLONE 3 MG PO TABS Oral Take 1 tablet (3 mg total) by mouth at bedtime. Take immediately before bedtime 30 tablet 2  . FEXOFENADINE HCL 180 MG PO TABS Oral Take 180 mg by mouth daily.      Marland Kitchen FLUTICASONE PROPIONATE 50 MCG/ACT NA SUSP Nasal Place 2 sprays into the nose daily. 16 g 3  . FULVESTRANT 250 MG/5ML IM SOLN Intramuscular Inject 5 mLs (250 mg total) into the muscle every 30 (thirty) days. One injection each buttock over 1-2 minutes. Warm prior to use. 5 mL 0  . HYDROCODONE-ACETAMINOPHEN 10-325 MG PO TABS  1 - 2 tabs by mouth every 6 hours as needed for pain secondary to cancer. 180 tablet 0  . LISINOPRIL-HYDROCHLOROTHIAZIDE 10-12.5 MG PO TABS  TAKE 1 TABLET BY MOUTH DAILY 90 tablet 1  .  MEGESTROL ACETATE 40 MG PO TABS      . OMEPRAZOLE 20 MG PO CPDR Oral Take 1 capsule (20 mg total) by mouth daily. 30 capsule 11  . ZOLEDRONIC ACID 4 MG/100ML IV SOLN Intravenous Inject 4 mg into the vein every 30 (thirty) days.      BP 145/75  Pulse 96  Temp(Src) 98.2 F (36.8 C) (Oral)  Resp 16  Ht 4\' 9"  (1.448 m)  Wt 117 lb (53.071 kg)  BMI 25.32 kg/m2  SpO2 100%  Physical Exam  Nursing note and vitals reviewed. Constitutional: She appears well-developed and well-nourished.  HENT:  Head: Normocephalic and atraumatic.  Eyes: Conjunctivae are normal. Pupils are equal, round, and reactive to light.  Neck: Neck supple. No tracheal deviation present. No thyromegaly present.  Cardiovascular: Normal rate and regular rhythm.   No murmur heard. Pulmonary/Chest: Effort normal and breath sounds  normal.  Abdominal: Soft. Bowel sounds are normal. She exhibits no distension. There is no tenderness.  Musculoskeletal: Normal range of motion. She exhibits no edema and no tenderness.  Neurological: She is alert. Coordination normal.  Skin: Skin is warm and dry. No rash noted.  Psychiatric: She has a normal mood and affect.    ED Course  Procedures (including critical care time)  Labs Reviewed - No data to display No results found.   No diagnosis found.    MDM  Patient reports symptoms consistent with bronchitis for which she's been treated multiple times in the past with amoxicillin. Symptoms likely viral but in light of immunocompromise state we'll write prescription for amoxicillin Patient has scheduled appointment with Dr.Lownes tomorrow she is encouraged keep Doubt pneumonia; no fever; no shortness of breath normal respiratory rate lungs clear auscultation; normal pulse ox  Diagnosis bronchitis        Doug Sou, MD 08/21/11 1439

## 2011-08-21 NOTE — Telephone Encounter (Signed)
Pt aware of 3-27 PET and to be NPO 6 hrs and 09-11-11 MD appointment

## 2011-08-22 ENCOUNTER — Ambulatory Visit: Payer: Medicare Other | Admitting: Family Medicine

## 2011-08-26 ENCOUNTER — Ambulatory Visit (HOSPITAL_BASED_OUTPATIENT_CLINIC_OR_DEPARTMENT_OTHER)
Admission: RE | Admit: 2011-08-26 | Discharge: 2011-08-26 | Disposition: A | Discharge status: Home / Self Care | Payer: Medicare Other | Source: Ambulatory Visit | Attending: Family | Admitting: Family

## 2011-08-26 ENCOUNTER — Ambulatory Visit (INDEPENDENT_AMBULATORY_CARE_PROVIDER_SITE_OTHER): Payer: Medicare Other | Admitting: Family

## 2011-08-26 ENCOUNTER — Encounter: Payer: Self-pay | Admitting: Family

## 2011-08-26 VITALS — BP 140/84 | HR 100 | Temp 97.8°F | Resp 16 | Wt 115.1 lb

## 2011-08-26 DIAGNOSIS — R05 Cough: Secondary | ICD-10-CM

## 2011-08-26 DIAGNOSIS — C50919 Malignant neoplasm of unspecified site of unspecified female breast: Secondary | ICD-10-CM

## 2011-08-26 DIAGNOSIS — J329 Chronic sinusitis, unspecified: Secondary | ICD-10-CM

## 2011-08-26 DIAGNOSIS — J40 Bronchitis, not specified as acute or chronic: Secondary | ICD-10-CM

## 2011-08-26 DIAGNOSIS — R059 Cough, unspecified: Secondary | ICD-10-CM

## 2011-08-26 MED ORDER — HYDROCOD POLST-CHLORPHEN POLST 10-8 MG/5ML PO LQCR: 5.0000 mL | Freq: Every evening | ORAL | Status: DC | PRN

## 2011-08-26 MED ORDER — CEFUROXIME AXETIL 500 MG PO TABS: 500.0000 mg | ORAL_TABLET | Freq: Two times a day (BID) | ORAL | Status: DC

## 2011-08-26 NOTE — Patient Instructions (Addendum)
Please complete your chest x-ray on the first floor today. Call if you symptoms worsen, or if no improvement in 2-3 days.

## 2011-08-26 NOTE — Progress Notes (Signed)
Subjective:    Patient ID: Anna Benjamin, female    DOB: 08-12-1935, 76 y.o.   MRN: [id number]  HPI  [name] is a 76 yr old female who presents today with chief complaint of cough.  She was evaluated on Wednesday 3/13 in the ED and was given amoxicillin for bronchitis. She continues to cough and is now having trouble sleeping at night due to cough.  She has associated sinus pressure/nasal congestion.  Nasal congestion was green in color, but is now clear.    She follows with Dr. Myna Benjamin for hx of metastatic breast cancer.  Cancelled pet scan due to cough.    Review of Systems See HPI  Past Medical History  Diagnosis Date  . Allergy   . Hyperlipidemia   . Hypertension   . Osteoporosis   . Breast cancer     History   Social History  . Marital Status: Single    Spouse Name: N/A    Number of Children: N/A  . Years of Education: N/A   Occupational History  . Not on file.   Social History Main Topics  . Smoking status: Never Smoker   . Smokeless tobacco: Never Used  . Alcohol Use: No  . Drug Use: No  . Sexually Active: No   Other Topics Concern  . Not on file   Social History Narrative  . No narrative on file    Past Surgical History  Procedure Date  . Abdominal hysterectomy 1986  . Tonsillectomy     Family History  Problem Relation Age of Onset  . Coronary artery disease    . Cancer Mother     ? etiology  . Cancer Brother     ? etiology  . Arthritis    . Depression      No Known Allergies  Current Outpatient Prescriptions on File Prior to Visit  Medication Sig Dispense Refill  . amoxicillin (AMOXIL) 500 MG capsule Take 1 capsule (500 mg total) by mouth 3 (three) times daily.  21 capsule  0  . aspirin 81 MG chewable tablet Chew 81 mg by mouth daily.        . cholecalciferol (VITAMIN D) 1000 UNITS tablet Take 1,000 Units by mouth daily.        Marland Kitchen dextromethorphan-guaiFENesin (MUCINEX DM) 30-600 MG per 12 hr tablet Take 1 tablet by mouth every 12  (twelve) hours.        . Eszopiclone (ESZOPICLONE) 3 MG TABS Take 1 tablet (3 mg total) by mouth at bedtime. Take immediately before bedtime  30 tablet  2  . fexofenadine (ALLEGRA) 180 MG tablet Take 180 mg by mouth daily.        . fluticasone (FLONASE) 50 MCG/ACT nasal spray Place 2 sprays into the nose daily.  16 g  3  . fulvestrant (FASLODEX) 250 MG/5ML injection Inject 5 mLs (250 mg total) into the muscle every 30 (thirty) days. One injection each buttock over 1-2 minutes. Warm prior to use.  5 mL  0  . HYDROcodone-acetaminophen (NORCO) 10-325 MG per tablet 1 - 2 tabs by mouth every 6 hours as needed for pain secondary to cancer.  180 tablet  0  . lisinopril-hydrochlorothiazide (PRINZIDE,ZESTORETIC) 10-12.5 MG per tablet TAKE 1 TABLET BY MOUTH DAILY  90 tablet  1  . megestrol (MEGACE) 40 MG tablet       . Zoledronic Acid (ZOMETA) 4 MG/100ML SOLN Inject 4 mg into the vein every 30 (thirty) days.      Marland Kitchen  albuterol (VENTOLIN HFA) 108 (90 BASE) MCG/ACT inhaler Inhale 2 puffs into the lungs every 6 (six) hours as needed for wheezing.      . beclomethasone (QVAR) 40 MCG/ACT inhaler Inhale 2 puffs into the lungs 2 (two) times daily.        Marland Kitchen omeprazole (PRILOSEC) 20 MG capsule Take 1 capsule (20 mg total) by mouth daily.  30 capsule  11    BP 140/84  Pulse 100  Temp(Src) 97.8 F (36.6 C) (Oral)  Resp 16  Wt 115 lb 1.3 oz (52.2 kg)  SpO2 95%       Objective:   Physical Exam  Constitutional: She appears well-developed and well-nourished.  HENT:  Head: Normocephalic and atraumatic.  Right Ear: Tympanic membrane and ear canal normal.  Left Ear: Tympanic membrane and ear canal normal.  Mouth/Throat: No posterior oropharyngeal edema or posterior oropharyngeal erythema.  Cardiovascular: Normal rate and regular rhythm.   No murmur heard. Pulmonary/Chest: Effort normal and breath sounds normal. No respiratory distress. She has no wheezes. She has no rales. She exhibits no tenderness.    Lymphadenopathy:    She has no cervical adenopathy.  Skin: Skin is warm and dry.  Psychiatric: She has a normal mood and affect. Her behavior is normal. Judgment and thought content normal.          Assessment & Plan:

## 2011-08-27 ENCOUNTER — Other Ambulatory Visit: Payer: Medicare Other | Admitting: Lab

## 2011-08-27 ENCOUNTER — Ambulatory Visit: Payer: Medicare Other | Admitting: Hematology & Oncology

## 2011-08-27 ENCOUNTER — Ambulatory Visit: Payer: Medicare Other

## 2011-08-28 DIAGNOSIS — J329 Chronic sinusitis, unspecified: Secondary | ICD-10-CM | POA: Insufficient documentation

## 2011-08-28 NOTE — Assessment & Plan Note (Signed)
Will plan to change amoxicillin to ceftin for an additional 7 days.

## 2011-08-28 NOTE — Assessment & Plan Note (Signed)
CXR is negative.  I think that her symptoms are more sinus related.  Continue mucinex DM for cough.

## 2011-09-03 ENCOUNTER — Ambulatory Visit (INDEPENDENT_AMBULATORY_CARE_PROVIDER_SITE_OTHER): Payer: Medicare Other | Admitting: Family Medicine

## 2011-09-03 ENCOUNTER — Encounter: Payer: Self-pay | Admitting: Family Medicine

## 2011-09-03 ENCOUNTER — Ambulatory Visit: Payer: Medicare Other | Admitting: Family Medicine

## 2011-09-03 VITALS — BP 118/79 | HR 78 | Temp 97.9°F | Ht <= 58 in | Wt 115.8 lb

## 2011-09-03 DIAGNOSIS — J302 Other seasonal allergic rhinitis: Secondary | ICD-10-CM

## 2011-09-03 DIAGNOSIS — J329 Chronic sinusitis, unspecified: Secondary | ICD-10-CM

## 2011-09-03 DIAGNOSIS — J309 Allergic rhinitis, unspecified: Secondary | ICD-10-CM

## 2011-09-03 MED ORDER — CEFUROXIME AXETIL 500 MG PO TABS: 500.0000 mg | ORAL_TABLET | Freq: Two times a day (BID) | ORAL | Status: DC

## 2011-09-03 NOTE — Assessment & Plan Note (Signed)
New.  Encouraged pt to use Flonase daily to decrease congestion and PND.  Pt in agreement.

## 2011-09-03 NOTE — Progress Notes (Signed)
  Subjective:    Patient ID: Anna Benjamin, female    DOB: 1935-06-26, 76 y.o.   MRN: [id number]  HPI Bronchitis- saw Melissa last week, dx'd w/ sinusitis rather than bronchitis.  Completed Ceftin yesterday.  Reports sinus pain has improved, no fever.  Still w/ PND, nasal congestion.  Cough is minimal.   Using Tessalon w/ good relief.  Using Flonase intermittently.  Taking Allegra daily.   Review of Systems For ROS see HPI     Objective:   Physical Exam  Vitals reviewed. Constitutional: She appears well-developed and well-nourished. No distress.  HENT:  Head: Normocephalic and atraumatic.  Right Ear: Tympanic membrane normal.  Left Ear: Tympanic membrane normal.  Nose: Mucosal edema and rhinorrhea present. Right sinus exhibits maxillary sinus tenderness and frontal sinus tenderness. Left sinus exhibits maxillary sinus tenderness and frontal sinus tenderness.  Mouth/Throat: Uvula is midline and mucous membranes are normal. Posterior oropharyngeal erythema present. No oropharyngeal exudate.  Eyes: Conjunctivae and EOM are normal. Pupils are equal, round, and reactive to light.  Neck: Normal range of motion. Neck supple.  Cardiovascular: Normal rate, regular rhythm and normal heart sounds.   Pulmonary/Chest: Effort normal and breath sounds normal. No respiratory distress. She has no wheezes.  Lymphadenopathy:    She has no cervical adenopathy.          Assessment & Plan:

## 2011-09-03 NOTE — Assessment & Plan Note (Signed)
Persistent.  Pt continues to have pain over frontal and maxillary sinuses.  Extend course of Ceftin for 7 more days.  Reviewed supportive care and red flags that should prompt return.  Pt expressed understanding and is in agreement w/ plan.

## 2011-09-03 NOTE — Patient Instructions (Signed)
We're going to extend the Ceftin to completely treat the sinus infection Continue the Allegra and the Mucinex Add the Flonase daily Call with any questions or concerns Hang in there!!!

## 2011-09-04 ENCOUNTER — Ambulatory Visit (HOSPITAL_BASED_OUTPATIENT_CLINIC_OR_DEPARTMENT_OTHER)
Admission: RE | Admit: 2011-09-04 | Discharge: 2011-09-04 | Disposition: A | Discharge status: Home / Self Care | Payer: Medicare Other | Source: Ambulatory Visit | Attending: Hematology & Oncology | Admitting: Hematology & Oncology

## 2011-09-04 DIAGNOSIS — R978 Other abnormal tumor markers: Secondary | ICD-10-CM | POA: Insufficient documentation

## 2011-09-04 DIAGNOSIS — C7951 Secondary malignant neoplasm of bone: Secondary | ICD-10-CM | POA: Insufficient documentation

## 2011-09-04 DIAGNOSIS — C7952 Secondary malignant neoplasm of bone marrow: Secondary | ICD-10-CM | POA: Insufficient documentation

## 2011-09-04 DIAGNOSIS — C787 Secondary malignant neoplasm of liver and intrahepatic bile duct: Secondary | ICD-10-CM | POA: Insufficient documentation

## 2011-09-04 DIAGNOSIS — C50919 Malignant neoplasm of unspecified site of unspecified female breast: Secondary | ICD-10-CM

## 2011-09-04 MED ORDER — FLUDEOXYGLUCOSE F - 18 (FDG) INJECTION
15.0000 | Freq: Once | INTRAVENOUS | Status: AC | PRN
  Administered 2011-09-04: 15 via INTRAVENOUS

## 2011-09-05 ENCOUNTER — Telehealth: Payer: Self-pay | Admitting: *Deleted

## 2011-09-05 NOTE — Telephone Encounter (Signed)
Message copied by Wynonia Hazard on Thu Sep 05, 2011  9:54 AM ------      Message from: Arlan Organ R      Created: Wed Sep 04, 2011  7:21 PM       I called and left a message. I told her that the PET scan actually looked good. There was no evidence of new breast cancers that I could see. What she had previously looked a little bit better. Again, there were no new areas that we could see. As the PET scan looks stable, I do not see that we need to make any changes in her overall therapy program. I told her to call us if she has any questions but I'm sure will see her soon.

## 2011-09-10 ENCOUNTER — Other Ambulatory Visit: Payer: Self-pay | Admitting: *Deleted

## 2011-09-10 DIAGNOSIS — C50919 Malignant neoplasm of unspecified site of unspecified female breast: Secondary | ICD-10-CM

## 2011-09-10 MED ORDER — HYDROCODONE-ACETAMINOPHEN 10-325 MG PO TABS: ORAL_TABLET | ORAL | Status: DC

## 2011-09-10 NOTE — Telephone Encounter (Signed)
Received refill authorization for Norco 10/325 from [facility]. Last filled on 08/07/11. Will route to Dr Myna Hidalgo for approval.

## 2011-09-11 ENCOUNTER — Ambulatory Visit (HOSPITAL_BASED_OUTPATIENT_CLINIC_OR_DEPARTMENT_OTHER): Payer: Medicare Other

## 2011-09-11 ENCOUNTER — Other Ambulatory Visit (HOSPITAL_BASED_OUTPATIENT_CLINIC_OR_DEPARTMENT_OTHER): Payer: Medicare Other | Admitting: Lab

## 2011-09-11 ENCOUNTER — Ambulatory Visit (HOSPITAL_BASED_OUTPATIENT_CLINIC_OR_DEPARTMENT_OTHER): Payer: Medicare Other | Admitting: Hematology & Oncology

## 2011-09-11 VITALS — BP 130/72 | HR 85 | Temp 97.1°F | Wt 115.0 lb

## 2011-09-11 DIAGNOSIS — Z5111 Encounter for antineoplastic chemotherapy: Secondary | ICD-10-CM

## 2011-09-11 DIAGNOSIS — C50119 Malignant neoplasm of central portion of unspecified female breast: Secondary | ICD-10-CM

## 2011-09-11 DIAGNOSIS — C7952 Secondary malignant neoplasm of bone marrow: Secondary | ICD-10-CM

## 2011-09-11 DIAGNOSIS — C787 Secondary malignant neoplasm of liver and intrahepatic bile duct: Secondary | ICD-10-CM

## 2011-09-11 DIAGNOSIS — C50919 Malignant neoplasm of unspecified site of unspecified female breast: Secondary | ICD-10-CM

## 2011-09-11 DIAGNOSIS — C7951 Secondary malignant neoplasm of bone: Secondary | ICD-10-CM

## 2011-09-11 LAB — CBC WITH DIFFERENTIAL (CANCER CENTER ONLY)
BASO#: 0 10*3/uL (ref 0.0–0.2)
Eosinophils Absolute: 0 10*3/uL (ref 0.0–0.5)
HCT: 39.4 % (ref 34.8–46.6)
HGB: 12.9 g/dL (ref 11.6–15.9)
LYMPH#: 3.4 10*3/uL — ABNORMAL HIGH (ref 0.9–3.3)
MONO#: 0.7 10*3/uL (ref 0.1–0.9)
NEUT#: 7.2 10*3/uL — ABNORMAL HIGH (ref 1.5–6.5)
NEUT%: 63.3 % (ref 39.6–80.0)
RBC: 4.14 10*6/uL (ref 3.70–5.32)
WBC: 11.4 10*3/uL — ABNORMAL HIGH (ref 3.9–10.0)

## 2011-09-11 MED ORDER — ZOLEDRONIC ACID 4 MG/100ML IV SOLN
4.0000 mg | Freq: Once | INTRAVENOUS | Status: AC
  Administered 2011-09-11: 4 mg via INTRAVENOUS
  Filled 2011-09-11: qty 100

## 2011-09-11 MED ORDER — FULVESTRANT 250 MG/5ML IM SOLN
500.0000 mg | INTRAMUSCULAR | Status: DC
  Administered 2011-09-11: 500 mg via INTRAMUSCULAR
  Filled 2011-09-11: qty 10

## 2011-09-11 NOTE — Progress Notes (Signed)
This office note has been dictated.

## 2011-09-11 NOTE — Progress Notes (Signed)
CC:   Lelon Perla, DO  DIAGNOSIS:  Metastatic breast cancer-estrogen receptor positive.  CURRENT THERAPY: 1. Faslodex 500 mg IM q. month. 2. Zometa 4 mg IV q. month.  INTERVAL HISTORY:  Ms. Trombetta comes in for followup.  We did go ahead and get a PET scan on her.  Surprisingly enough, the PET scan actually showed that her disease was better now.  She has a decrease in her liver mets.  There is no evidence of disease progression.  She has skeletal mets, which some have increased in metabolic activity while others have decreased.  No new lesions have been noted.  Surprisingly enough, the tumor marker does continue to go up.  I am not sure how reliable this truly is, given the PET scan findings.  Her last CA27.49 was 505.  Ms. Schutt is doing quite well.  She reports she had family problems over the Easter holiday.  Her son had emergent cardiac angioplasty and had 3 stents placed.  He is going to have 3 more stents placed, I think this week or next week.  She is still working.  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  She has not noted any problems with cough.  She did have a little bit of a bronchitis issue.  A chest x-ray was done on the 18th.  This showed no evidence of acute cardiopulmonary disease.  She has had no fever.  She has had no problems with bleeding.  There has been no problems with bowels or bladder.  Overall, perform status is ECOG 1.  PHYSICAL EXAM:  General: This is a very petite white female in no obvious distress.  Vital signs: Temperature 97.1, pulse 85, respiratory 20, blood pressure 130/72, and weight is 115.  Head and neck exam shows a normocephalic, atraumatic skull.  There are no ocular or oral lesions. There are no palpable cervical or supraclavicular lymph nodes.  Lungs are clear bilaterally.  Cardiac exam: Regular rate and rhythm with a normal S1 and S2.  There are no murmurs, rubs, or bruits.   Abdominal exam: Soft with good bowel sounds.  There is no palpable abdominal mass. There is no hepatosplenomegaly.  Back exam: Shows some social some kyphosis.  There is no tenderness over the spine, ribs, or hips. Extremities: Shows no clubbing, cyanosis, or edema.  Neurological exam: Shows no focal neurological deficits.  LABORATORY STUDIES:  White cell count 11.4, hemoglobin 12.9, hematocrit 39.4, and platelet count 303.  IMPRESSION:  Ms. Behrens is a very nice 76 year old white female with metastatic breast cancer.  We have been following her now for several years.  Her disease, by the recent PET scan, certainly looks to be stable.  Again, it is hard to say what the CA27.9 is indicative of.  Again, since the PET scan does not show that her disease is progressing, we will go ahead and continue her on the Faslodex.  I believe that this medication is also helping her out.  We will go ahead and get her back monthly for her Faslodex and Zometa.  I will plan to see her back myself in 2 months.low    ______________________________ Josph Macho, M.D. PRE/MEDQ  D:  09/11/2011  T:  09/11/2011  Job:  [id number]

## 2011-09-12 LAB — COMPREHENSIVE METABOLIC PANEL
ALT: 16 U/L (ref 0–35)
AST: 21 U/L (ref 0–37)
Alkaline Phosphatase: 117 U/L (ref 39–117)
Calcium: 9.8 mg/dL (ref 8.4–10.5)
Chloride: 103 mEq/L (ref 96–112)
Creatinine, Ser: 0.81 mg/dL (ref 0.50–1.10)

## 2011-09-12 LAB — CANCER ANTIGEN 27.29: CA 27.29: 736 U/mL — ABNORMAL HIGH (ref 0–39)

## 2011-09-12 LAB — PREALBUMIN: Prealbumin: 35.7 mg/dL — ABNORMAL HIGH (ref 17.0–34.0)

## 2011-09-24 ENCOUNTER — Other Ambulatory Visit: Payer: Self-pay | Admitting: *Deleted

## 2011-09-24 DIAGNOSIS — C50919 Malignant neoplasm of unspecified site of unspecified female breast: Secondary | ICD-10-CM

## 2011-09-24 MED ORDER — ESZOPICLONE 3 MG PO TABS: 3.0000 mg | ORAL_TABLET | Freq: Every day | ORAL | Status: DC

## 2011-09-24 NOTE — Telephone Encounter (Signed)
Received refill request from [facility] for Lunesta 3 mg # 30. This is a chronic med for the pt and has recently seen Dr Myna Hidalgo. Called in to [phone].

## 2011-10-09 ENCOUNTER — Ambulatory Visit: Payer: Medicare Other

## 2011-10-09 ENCOUNTER — Ambulatory Visit (HOSPITAL_BASED_OUTPATIENT_CLINIC_OR_DEPARTMENT_OTHER): Payer: Medicare Other

## 2011-10-09 ENCOUNTER — Other Ambulatory Visit: Payer: Medicare Other | Admitting: Lab

## 2011-10-09 ENCOUNTER — Other Ambulatory Visit (HOSPITAL_BASED_OUTPATIENT_CLINIC_OR_DEPARTMENT_OTHER): Payer: Medicare Other | Admitting: Lab

## 2011-10-09 DIAGNOSIS — C50919 Malignant neoplasm of unspecified site of unspecified female breast: Secondary | ICD-10-CM

## 2011-10-09 DIAGNOSIS — Z5111 Encounter for antineoplastic chemotherapy: Secondary | ICD-10-CM

## 2011-10-09 DIAGNOSIS — C7951 Secondary malignant neoplasm of bone: Secondary | ICD-10-CM

## 2011-10-09 DIAGNOSIS — C50119 Malignant neoplasm of central portion of unspecified female breast: Secondary | ICD-10-CM

## 2011-10-09 DIAGNOSIS — C787 Secondary malignant neoplasm of liver and intrahepatic bile duct: Secondary | ICD-10-CM

## 2011-10-09 LAB — CBC WITH DIFFERENTIAL (CANCER CENTER ONLY)
BASO#: 0 10*3/uL (ref 0.0–0.2)
EOS%: 0.5 % (ref 0.0–7.0)
Eosinophils Absolute: 0.1 10*3/uL (ref 0.0–0.5)
HCT: 37.9 % (ref 34.8–46.6)
HGB: 12.5 g/dL (ref 11.6–15.9)
MCH: 31.9 pg (ref 26.0–34.0)
MCHC: 33 g/dL (ref 32.0–36.0)
MONO%: 7.1 % (ref 0.0–13.0)
NEUT#: 5.4 10*3/uL (ref 1.5–6.5)
NEUT%: 48.7 % (ref 39.6–80.0)
RBC: 3.92 10*6/uL (ref 3.70–5.32)

## 2011-10-09 LAB — CMP (CANCER CENTER ONLY)
ALT(SGPT): 18 U/L (ref 10–47)
Albumin: 3.7 g/dL (ref 3.3–5.5)
BUN, Bld: 17 mg/dL (ref 7–22)
CO2: 28 mEq/L (ref 18–33)
Calcium: 9.1 mg/dL (ref 8.0–10.3)
Chloride: 101 mEq/L (ref 98–108)
Creat: 0.7 mg/dl (ref 0.6–1.2)

## 2011-10-09 MED ORDER — ZOLEDRONIC ACID 4 MG/100ML IV SOLN
4.0000 mg | Freq: Once | INTRAVENOUS | Status: AC
  Administered 2011-10-09: 4 mg via INTRAVENOUS
  Filled 2011-10-09: qty 100

## 2011-10-09 MED ORDER — FULVESTRANT 250 MG/5ML IM SOLN
500.0000 mg | INTRAMUSCULAR | Status: DC
  Administered 2011-10-09: 500 mg via INTRAMUSCULAR
  Filled 2011-10-09: qty 10

## 2011-10-09 NOTE — Patient Instructions (Signed)
Zoledronic Acid injection (Hypercalcemia, Oncology) What is this medicine? ZOLEDRONIC ACID (ZOE le dron ik AS id) lowers the amount of calcium loss from bone. It is used to treat too much calcium in your blood from cancer. It is also used to prevent complications of cancer that has spread to the bone. This medicine may be used for other purposes; ask your health care provider or pharmacist if you have questions. What should I tell my health care provider before I take this medicine? They need to know if you have any of these conditions: -aspirin-sensitive asthma -dental disease -kidney disease -an unusual or allergic reaction to zoledronic acid, other medicines, foods, dyes, or preservatives -pregnant or trying to get pregnant -breast-feeding How should I use this medicine? This medicine is for infusion into a vein. It is given by a health care professional in a hospital or clinic setting. Talk to your pediatrician regarding the use of this medicine in children. Special care may be needed. Overdosage: If you think you have taken too much of this medicine contact a poison control center or emergency room at once. NOTE: This medicine is only for you. Do not share this medicine with others. What if I miss a dose? It is important not to miss your dose. Call your doctor or health care professional if you are unable to keep an appointment. What may interact with this medicine? -certain antibiotics given by injection -NSAIDs, medicines for pain and inflammation, like ibuprofen or naproxen -some diuretics like bumetanide, furosemide -teriparatide -thalidomide This list may not describe all possible interactions. Give your health care provider a list of all the medicines, herbs, non-prescription drugs, or dietary supplements you use. Also tell them if you smoke, drink alcohol, or use illegal drugs. Some items may interact with your medicine. What should I watch for while using this medicine? Visit  your doctor or health care professional for regular checkups. It may be some time before you see the benefit from this medicine. Do not stop taking your medicine unless your doctor tells you to. Your doctor may order blood tests or other tests to see how you are doing. Women should inform their doctor if they wish to become pregnant or think they might be pregnant. There is a potential for serious side effects to an unborn child. Talk to your health care professional or pharmacist for more information. You should make sure that you get enough calcium and vitamin D while you are taking this medicine. Discuss the foods you eat and the vitamins you take with your health care professional. Some people who take this medicine have severe bone, joint, and/or muscle pain. This medicine may also increase your risk for a broken thigh bone. Tell your doctor right away if you have pain in your upper leg or groin. Tell your doctor if you have any pain that does not go away or that gets worse. What side effects may I notice from receiving this medicine? Side effects that you should report to your doctor or health care professional as soon as possible: -allergic reactions like skin rash, itching or hives, swelling of the face, lips, or tongue -anxiety, confusion, or depression -breathing problems -changes in vision -feeling faint or lightheaded, falls -jaw burning, cramping, pain -muscle cramps, stiffness, or weakness -trouble passing urine or change in the amount of urine Side effects that usually do not require medical attention (report to your doctor or health care professional if they continue or are bothersome): -bone, joint, or muscle pain -  fever -hair loss -irritation at site where injected -loss of appetite -nausea, vomiting -stomach upset -tired This list may not describe all possible side effects. Call your doctor for medical advice about side effects. You may report side effects to FDA at  1-800-FDA-1088. Where should I keep my medicine? This drug is given in a hospital or clinic and will not be stored at home. NOTE: This sheet is a summary. It may not cover all possible information. If you have questions about this medicine, talk to your doctor, pharmacist, or health care provider.  2012, [employer]vier/[employer] Standard. (11/23/2010 9:06:58 AM) 

## 2011-11-06 ENCOUNTER — Ambulatory Visit (HOSPITAL_BASED_OUTPATIENT_CLINIC_OR_DEPARTMENT_OTHER): Payer: Medicare Other

## 2011-11-06 ENCOUNTER — Other Ambulatory Visit (HOSPITAL_BASED_OUTPATIENT_CLINIC_OR_DEPARTMENT_OTHER): Payer: Medicare Other | Admitting: Lab

## 2011-11-06 ENCOUNTER — Ambulatory Visit (HOSPITAL_BASED_OUTPATIENT_CLINIC_OR_DEPARTMENT_OTHER): Payer: Medicare Other | Admitting: Hematology & Oncology

## 2011-11-06 VITALS — BP 116/63 | HR 85 | Temp 97.0°F | Ht <= 58 in | Wt 114.0 lb

## 2011-11-06 DIAGNOSIS — C7951 Secondary malignant neoplasm of bone: Secondary | ICD-10-CM

## 2011-11-06 DIAGNOSIS — C50119 Malignant neoplasm of central portion of unspecified female breast: Secondary | ICD-10-CM

## 2011-11-06 DIAGNOSIS — C50919 Malignant neoplasm of unspecified site of unspecified female breast: Secondary | ICD-10-CM

## 2011-11-06 DIAGNOSIS — Z5111 Encounter for antineoplastic chemotherapy: Secondary | ICD-10-CM

## 2011-11-06 DIAGNOSIS — C787 Secondary malignant neoplasm of liver and intrahepatic bile duct: Secondary | ICD-10-CM

## 2011-11-06 DIAGNOSIS — C7952 Secondary malignant neoplasm of bone marrow: Secondary | ICD-10-CM

## 2011-11-06 LAB — COMPREHENSIVE METABOLIC PANEL
ALT: 14 U/L (ref 0–35)
AST: 19 U/L (ref 0–37)
Albumin: 4.6 g/dL (ref 3.5–5.2)
CO2: 25 mEq/L (ref 19–32)
Calcium: 9.2 mg/dL (ref 8.4–10.5)
Chloride: 106 mEq/L (ref 96–112)
Creatinine, Ser: 0.85 mg/dL (ref 0.50–1.10)
Potassium: 3.6 mEq/L (ref 3.5–5.3)
Sodium: 140 mEq/L (ref 135–145)
Total Protein: 6.3 g/dL (ref 6.0–8.3)

## 2011-11-06 LAB — CBC WITH DIFFERENTIAL (CANCER CENTER ONLY)
BASO%: 0.4 % (ref 0.0–2.0)
HCT: 38.2 % (ref 34.8–46.6)
HGB: 12.6 g/dL (ref 11.6–15.9)
LYMPH#: 4.5 10*3/uL — ABNORMAL HIGH (ref 0.9–3.3)
MONO#: 0.7 10*3/uL (ref 0.1–0.9)
NEUT#: 5.7 10*3/uL (ref 1.5–6.5)
NEUT%: 51.9 % (ref 39.6–80.0)
RDW: 12.2 % (ref 11.1–15.7)
WBC: 11 10*3/uL — ABNORMAL HIGH (ref 3.9–10.0)

## 2011-11-06 LAB — CANCER ANTIGEN 27.29: CA 27.29: 880 U/mL — ABNORMAL HIGH (ref 0–39)

## 2011-11-06 MED ORDER — FULVESTRANT 250 MG/5ML IM SOLN
500.0000 mg | INTRAMUSCULAR | Status: DC
  Administered 2011-11-06: 500 mg via INTRAMUSCULAR
  Filled 2011-11-06: qty 10

## 2011-11-06 MED ORDER — ZOLEDRONIC ACID 4 MG/100ML IV SOLN
4.0000 mg | Freq: Once | INTRAVENOUS | Status: AC
  Administered 2011-11-06: 4 mg via INTRAVENOUS
  Filled 2011-11-06: qty 100

## 2011-11-07 NOTE — Progress Notes (Signed)
CC:   Anna Perla, DO  DIAGNOSIS:  Metastatic breast cancer, estrogen receptor-positive.  CURRENT THERAPY: 1. Faslodex 500 mg IM q.month. 2. Zometa 4 mg IV q.month.  INTERIM HISTORY: Anna Benjamin comes in for her followup.  She is doing okay.  She had her last CA27-29 in early May and it was down to 600.  She is not complaining of any kind of pain.  She is still working.  She is resting pretty well.  Her appetite is okay.  She had a nice Memorial Day weekend.  PHYSICAL EXAMINATION:  This is a petite white female in no obvious distress.  VITAL signs:  Temperature of 97, pulse 85, respiratory rate 18, blood pressure 116/63.  Weight is 114.  Head and neck:  A normocephalic, atraumatic skull.  There are no ocular or oral lesions. There are no palpable cervical or supraclavicular lymph nodes.  Lungs: Clear bilaterally.  Cardiac:  Regular rate and rhythm with a normal S1 and S2.  There are no murmurs, rubs or bruits.  Abdomen:  Soft with good bowel sounds.  There is no palpable abdominal mass.  There is no fluid wave.  No palpable hepatosplenomegaly.  Extremities:  No clubbing, cyanosis or edema.  She does have some age-related osteoarthritic changes.  Back:  Kyphosis.  No tenderness is noted over the spine, ribs, or hips.  Neurologic:  No focal neurological deficits.  LABORATORY STUDIES:  White cell count 11, hemoglobin 12.6, hematocrit 38.2, platelet count 270.  IMPRESSION:  Anna Benjamin is a 76 year old white female with metastatic breast cancer.  She does seem to be doing well with the Faslodex.  Her PET scan that she had done back in March looked better.  Her CA27-29 is improving.  For now, we will continue her on the monthly Faslodex.  She will also receive  her Zometa monthly.  I will plan to get her back to see me in another 2 months.    ______________________________ Josph Macho, M.D. PRE/MEDQ  D:  11/06/2011  T:  11/07/2011  Job:  [id number]

## 2011-11-15 NOTE — Progress Notes (Signed)
Patient discharged at 1300. After infusion and injection

## 2011-11-21 ENCOUNTER — Other Ambulatory Visit: Payer: Self-pay | Admitting: Family Medicine

## 2011-11-22 ENCOUNTER — Other Ambulatory Visit: Payer: Self-pay | Admitting: *Deleted

## 2011-11-22 DIAGNOSIS — C50919 Malignant neoplasm of unspecified site of unspecified female breast: Secondary | ICD-10-CM

## 2011-11-22 NOTE — Telephone Encounter (Signed)
Received refill request from [facility] for hydrocodone/apap 10-325. This is a chronic med for the pt & she has a new neck mass. Faxed back to [facility] after verbal approval from Dr Myna Hidalgo.

## 2011-11-29 MED ORDER — HYDROCODONE-ACETAMINOPHEN 10-325 MG PO TABS: ORAL_TABLET | ORAL | Status: DC

## 2011-12-04 ENCOUNTER — Other Ambulatory Visit (HOSPITAL_BASED_OUTPATIENT_CLINIC_OR_DEPARTMENT_OTHER): Payer: Medicare Other | Admitting: Lab

## 2011-12-04 ENCOUNTER — Ambulatory Visit (HOSPITAL_BASED_OUTPATIENT_CLINIC_OR_DEPARTMENT_OTHER): Payer: Medicare Other

## 2011-12-04 VITALS — BP 112/65 | HR 81 | Temp 96.6°F

## 2011-12-04 DIAGNOSIS — C7952 Secondary malignant neoplasm of bone marrow: Secondary | ICD-10-CM

## 2011-12-04 DIAGNOSIS — C50119 Malignant neoplasm of central portion of unspecified female breast: Secondary | ICD-10-CM

## 2011-12-04 DIAGNOSIS — Z5111 Encounter for antineoplastic chemotherapy: Secondary | ICD-10-CM

## 2011-12-04 DIAGNOSIS — C7951 Secondary malignant neoplasm of bone: Secondary | ICD-10-CM

## 2011-12-04 DIAGNOSIS — C50919 Malignant neoplasm of unspecified site of unspecified female breast: Secondary | ICD-10-CM

## 2011-12-04 LAB — CBC WITH DIFFERENTIAL (CANCER CENTER ONLY)
BASO%: 0.2 % (ref 0.0–2.0)
EOS%: 0.4 % (ref 0.0–7.0)
MCH: 31.7 pg (ref 26.0–34.0)
MCHC: 33.2 g/dL (ref 32.0–36.0)
MONO%: 5.9 % (ref 0.0–13.0)
NEUT#: 5.7 10*3/uL (ref 1.5–6.5)
Platelets: 227 10*3/uL (ref 145–400)
RBC: 3.72 10*6/uL (ref 3.70–5.32)

## 2011-12-04 LAB — CANCER ANTIGEN 27.29: CA 27.29: 979 U/mL — ABNORMAL HIGH (ref 0–39)

## 2011-12-04 MED ORDER — ZOLEDRONIC ACID 4 MG/100ML IV SOLN
4.0000 mg | Freq: Once | INTRAVENOUS | Status: DC
  Filled 2011-12-04: qty 100

## 2011-12-04 MED ORDER — FULVESTRANT 250 MG/5ML IM SOLN
500.0000 mg | INTRAMUSCULAR | Status: DC
  Administered 2011-12-04: 500 mg via INTRAMUSCULAR
  Filled 2011-12-04: qty 10

## 2011-12-04 MED ORDER — ZOLEDRONIC ACID 4 MG/100ML IV SOLN
4.0000 mg | Freq: Once | INTRAVENOUS | Status: AC
  Administered 2011-12-04: 4 mg via INTRAVENOUS
  Filled 2011-12-04: qty 100

## 2011-12-04 NOTE — Patient Instructions (Signed)
Zoledronic Acid injection (Hypercalcemia, Oncology) What is this medicine? ZOLEDRONIC ACID (ZOE le dron ik AS id) lowers the amount of calcium loss from bone. It is used to treat too much calcium in your blood from cancer. It is also used to prevent complications of cancer that has spread to the bone. This medicine may be used for other purposes; ask your health care provider or pharmacist if you have questions. What should I tell my health care provider before I take this medicine? They need to know if you have any of these conditions: -aspirin-sensitive asthma -dental disease -kidney disease -an unusual or allergic reaction to zoledronic acid, other medicines, foods, dyes, or preservatives -pregnant or trying to get pregnant -breast-feeding How should I use this medicine? This medicine is for infusion into a vein. It is given by a health care professional in a hospital or clinic setting. Talk to your pediatrician regarding the use of this medicine in children. Special care may be needed. Overdosage: If you think you have taken too much of this medicine contact a poison control center or emergency room at once. NOTE: This medicine is only for you. Do not share this medicine with others. What if I miss a dose? It is important not to miss your dose. Call your doctor or health care professional if you are unable to keep an appointment. What may interact with this medicine? -certain antibiotics given by injection -NSAIDs, medicines for pain and inflammation, like ibuprofen or naproxen -some diuretics like bumetanide, furosemide -teriparatide -thalidomide This list may not describe all possible interactions. Give your health care provider a list of all the medicines, herbs, non-prescription drugs, or dietary supplements you use. Also tell them if you smoke, drink alcohol, or use illegal drugs. Some items may interact with your medicine. What should I watch for while using this medicine? Visit  your doctor or health care professional for regular checkups. It may be some time before you see the benefit from this medicine. Do not stop taking your medicine unless your doctor tells you to. Your doctor may order blood tests or other tests to see how you are doing. Women should inform their doctor if they wish to become pregnant or think they might be pregnant. There is a potential for serious side effects to an unborn child. Talk to your health care professional or pharmacist for more information. You should make sure that you get enough calcium and vitamin D while you are taking this medicine. Discuss the foods you eat and the vitamins you take with your health care professional. Some people who take this medicine have severe bone, joint, and/or muscle pain. This medicine may also increase your risk for a broken thigh bone. Tell your doctor right away if you have pain in your upper leg or groin. Tell your doctor if you have any pain that does not go away or that gets worse. What side effects may I notice from receiving this medicine? Side effects that you should report to your doctor or health care professional as soon as possible: -allergic reactions like skin rash, itching or hives, swelling of the face, lips, or tongue -anxiety, confusion, or depression -breathing problems -changes in vision -feeling faint or lightheaded, falls -jaw burning, cramping, pain -muscle cramps, stiffness, or weakness -trouble passing urine or change in the amount of urine Side effects that usually do not require medical attention (report to your doctor or health care professional if they continue or are bothersome): -bone, joint, or muscle pain -  fever -hair loss -irritation at site where injected -loss of appetite -nausea, vomiting -stomach upset -tired This list may not describe all possible side effects. Call your doctor for medical advice about side effects. You may report side effects to FDA at  1-800-FDA-1088. Where should I keep my medicine? This drug is given in a hospital or clinic and will not be stored at home. NOTE: This sheet is a summary. It may not cover all possible information. If you have questions about this medicine, talk to your doctor, pharmacist, or health care provider.  2012, [employer]vier/[employer] Standard. (11/23/2010 9:06:58 AM) 

## 2011-12-09 NOTE — Progress Notes (Signed)
This office note has been dictated.

## 2012-01-01 ENCOUNTER — Ambulatory Visit (HOSPITAL_BASED_OUTPATIENT_CLINIC_OR_DEPARTMENT_OTHER): Payer: Medicare Other | Admitting: Hematology & Oncology

## 2012-01-01 ENCOUNTER — Other Ambulatory Visit (HOSPITAL_BASED_OUTPATIENT_CLINIC_OR_DEPARTMENT_OTHER): Payer: Medicare Other | Admitting: Lab

## 2012-01-01 ENCOUNTER — Ambulatory Visit (HOSPITAL_BASED_OUTPATIENT_CLINIC_OR_DEPARTMENT_OTHER): Payer: Medicare Other

## 2012-01-01 VITALS — BP 126/72 | HR 71 | Temp 97.1°F | Ht 59.0 in | Wt 115.0 lb

## 2012-01-01 DIAGNOSIS — C50919 Malignant neoplasm of unspecified site of unspecified female breast: Secondary | ICD-10-CM

## 2012-01-01 DIAGNOSIS — C50119 Malignant neoplasm of central portion of unspecified female breast: Secondary | ICD-10-CM

## 2012-01-01 DIAGNOSIS — C7952 Secondary malignant neoplasm of bone marrow: Secondary | ICD-10-CM

## 2012-01-01 DIAGNOSIS — Z5111 Encounter for antineoplastic chemotherapy: Secondary | ICD-10-CM

## 2012-01-01 DIAGNOSIS — C7951 Secondary malignant neoplasm of bone: Secondary | ICD-10-CM

## 2012-01-01 DIAGNOSIS — C787 Secondary malignant neoplasm of liver and intrahepatic bile duct: Secondary | ICD-10-CM

## 2012-01-01 LAB — CBC WITH DIFFERENTIAL (CANCER CENTER ONLY)
BASO#: 0 10*3/uL (ref 0.0–0.2)
Eosinophils Absolute: 0.1 10*3/uL (ref 0.0–0.5)
HGB: 12.3 g/dL (ref 11.6–15.9)
MCH: 31.4 pg (ref 26.0–34.0)
MCV: 95 fL (ref 81–101)
MONO#: 0.7 10*3/uL (ref 0.1–0.9)
NEUT#: 5.3 10*3/uL (ref 1.5–6.5)
Platelets: 233 10*3/uL (ref 145–400)
RBC: 3.92 10*6/uL (ref 3.70–5.32)
WBC: 9.6 10*3/uL (ref 3.9–10.0)

## 2012-01-01 LAB — CMP (CANCER CENTER ONLY)
Albumin: 4 g/dL (ref 3.3–5.5)
BUN, Bld: 20 mg/dL (ref 7–22)
CO2: 28 mEq/L (ref 18–33)
Calcium: 9.6 mg/dL (ref 8.0–10.3)
Glucose, Bld: 108 mg/dL (ref 73–118)
Potassium: 3.8 mEq/L (ref 3.3–4.7)
Sodium: 141 mEq/L (ref 128–145)
Total Protein: 7.4 g/dL (ref 6.4–8.1)

## 2012-01-01 MED ORDER — FULVESTRANT 250 MG/5ML IM SOLN
500.0000 mg | INTRAMUSCULAR | Status: DC
  Administered 2012-01-01: 500 mg via INTRAMUSCULAR
  Filled 2012-01-01: qty 10

## 2012-01-01 MED ORDER — ZOLEDRONIC ACID 4 MG/100ML IV SOLN
4.0000 mg | Freq: Once | INTRAVENOUS | Status: AC
  Administered 2012-01-01: 4 mg via INTRAVENOUS
  Filled 2012-01-01: qty 100

## 2012-01-01 NOTE — Patient Instructions (Signed)
Zoledronic Acid injection (Hypercalcemia, Oncology) What is this medicine? ZOLEDRONIC ACID (ZOE le dron ik AS id) lowers the amount of calcium loss from bone. It is used to treat too much calcium in your blood from cancer. It is also used to prevent complications of cancer that has spread to the bone. This medicine may be used for other purposes; ask your health care provider or pharmacist if you have questions. What should I tell my health care provider before I take this medicine? They need to know if you have any of these conditions: -aspirin-sensitive asthma -dental disease -kidney disease -an unusual or allergic reaction to zoledronic acid, other medicines, foods, dyes, or preservatives -pregnant or trying to get pregnant -breast-feeding How should I use this medicine? This medicine is for infusion into a vein. It is given by a health care professional in a hospital or clinic setting. Talk to your pediatrician regarding the use of this medicine in children. Special care may be needed. Overdosage: If you think you have taken too much of this medicine contact a poison control center or emergency room at once. NOTE: This medicine is only for you. Do not share this medicine with others. What if I miss a dose? It is important not to miss your dose. Call your doctor or health care professional if you are unable to keep an appointment. What may interact with this medicine? -certain antibiotics given by injection -NSAIDs, medicines for pain and inflammation, like ibuprofen or naproxen -some diuretics like bumetanide, furosemide -teriparatide -thalidomide This list may not describe all possible interactions. Give your health care provider a list of all the medicines, herbs, non-prescription drugs, or dietary supplements you use. Also tell them if you smoke, drink alcohol, or use illegal drugs. Some items may interact with your medicine. What should I watch for while using this medicine? Visit  your doctor or health care professional for regular checkups. It may be some time before you see the benefit from this medicine. Do not stop taking your medicine unless your doctor tells you to. Your doctor may order blood tests or other tests to see how you are doing. Women should inform their doctor if they wish to become pregnant or think they might be pregnant. There is a potential for serious side effects to an unborn child. Talk to your health care professional or pharmacist for more information. You should make sure that you get enough calcium and vitamin D while you are taking this medicine. Discuss the foods you eat and the vitamins you take with your health care professional. Some people who take this medicine have severe bone, joint, and/or muscle pain. This medicine may also increase your risk for a broken thigh bone. Tell your doctor right away if you have pain in your upper leg or groin. Tell your doctor if you have any pain that does not go away or that gets worse. What side effects may I notice from receiving this medicine? Side effects that you should report to your doctor or health care professional as soon as possible: -allergic reactions like skin rash, itching or hives, swelling of the face, lips, or tongue -anxiety, confusion, or depression -breathing problems -changes in vision -feeling faint or lightheaded, falls -jaw burning, cramping, pain -muscle cramps, stiffness, or weakness -trouble passing urine or change in the amount of urine Side effects that usually do not require medical attention (report to your doctor or health care professional if they continue or are bothersome): -bone, joint, or muscle pain -  fever -hair loss -irritation at site where injected -loss of appetite -nausea, vomiting -stomach upset -tired This list may not describe all possible side effects. Call your doctor for medical advice about side effects. You may report side effects to FDA at  1-800-FDA-1088. Where should I keep my medicine? This drug is given in a hospital or clinic and will not be stored at home. NOTE: This sheet is a summary. It may not cover all possible information. If you have questions about this medicine, talk to your doctor, pharmacist, or health care provider.  2012, [employer]vier/[employer] Standard. (11/23/2010 9:06:58 AM) 

## 2012-01-01 NOTE — Progress Notes (Signed)
This office note has been dictated.

## 2012-01-02 NOTE — Progress Notes (Signed)
CC:   Lelon Perla, DO  DIAGNOSIS:  Metastatic breast cancer.  CURRENT THERAPY: 1. Faslodex 500 mg IM q. month. 2. Zometa 4 mg IV q. month.  INTERIM HISTORY:  Ms. Skipper comes in for followup.  She continues to feel well.  There is no problem with pain.  She is on Vicodin, which she takes 4 or 5 times a day.  She is having a decent summer.  She says she might be going back up to [place], where I think they have a trailer.  Her CA27.29 has been going up.  Her last level is 979.  She is having no nausea or vomiting.  There is no change in bowel or bladder habits.  She has had no rashes.  There has been no headache. There have been no swallowing difficulties.  There is no change in vision.  PHYSICAL EXAMINATION:  This is a petite white female in no obvious distress.  Vital signs: 97.1, pulse 71, respiratory rate 18, blood pressure 126/72.  Weight is 115.  Head and neck:  Normocephalic, atraumatic skull.  There are no ocular or oral lesions.  There are no palpable cervical or supraclavicular lymph nodes.  Lungs:  Clear bilaterally.  There are no rales, wheezes or rhonchi.  Cardiac:  Regular rate and rhythm with a normal S1 and S2.  There are no murmurs, rubs or bruits.  Abdomen:  Soft with good bowel sounds.  There is no palpable abdominal mass.  There is no fluid wave.  There is no palpable hepatosplenomegaly.  Back:  No tenderness over the spine, ribs, or hips. There is some slight kyphosis.  Extremities:  No clubbing, cyanosis or edema.  She has good range of motion of her joints.  Skin:  No rashes, ecchymosis or petechia.  Neurologic:  No focal neurological deficits.  LABORATORY STUDIES:  White cell count 9.6, hemoglobin 12.3, hematocrit 37.1, platelet count 233.  IMPRESSION:  Mr. Broski is a very nice 76 year old white female with metastatic breast cancer.  She is doing well clinically.  We really need to get another PET scan on her so I can see exactly what her  cancer is "doing."  She has always been incredibly reluctant to go toward chemotherapy.  If we do find that she is progressing, I think our next step would have to be chemotherapy.  I might be able to talk with her regarding Xeloda, since this is oral agent.  We will see her back in a month.  We will get the PET scan in a couple weeks.   ______________________________ Josph Macho, M.D. PRE/MEDQ  D:  01/01/2012  T:  01/02/2012  Job:  [id number]

## 2012-01-08 ENCOUNTER — Ambulatory Visit (HOSPITAL_BASED_OUTPATIENT_CLINIC_OR_DEPARTMENT_OTHER)
Admission: RE | Admit: 2012-01-08 | Discharge: 2012-01-08 | Disposition: A | Discharge status: Home / Self Care | Payer: Medicare Other | Source: Ambulatory Visit | Attending: Hematology & Oncology | Admitting: Hematology & Oncology

## 2012-01-08 DIAGNOSIS — C50919 Malignant neoplasm of unspecified site of unspecified female breast: Secondary | ICD-10-CM | POA: Insufficient documentation

## 2012-01-08 DIAGNOSIS — C7951 Secondary malignant neoplasm of bone: Secondary | ICD-10-CM | POA: Insufficient documentation

## 2012-01-08 DIAGNOSIS — C787 Secondary malignant neoplasm of liver and intrahepatic bile duct: Secondary | ICD-10-CM | POA: Insufficient documentation

## 2012-01-08 DIAGNOSIS — C7952 Secondary malignant neoplasm of bone marrow: Secondary | ICD-10-CM | POA: Insufficient documentation

## 2012-01-08 MED ORDER — FLUDEOXYGLUCOSE F - 18 (FDG) INJECTION
13.7200 | Freq: Once | INTRAVENOUS | Status: AC | PRN
  Administered 2012-01-08: 13.72 via INTRAVENOUS

## 2012-01-09 ENCOUNTER — Other Ambulatory Visit: Payer: Self-pay | Admitting: *Deleted

## 2012-01-09 DIAGNOSIS — C50919 Malignant neoplasm of unspecified site of unspecified female breast: Secondary | ICD-10-CM

## 2012-01-09 MED ORDER — HYDROCODONE-ACETAMINOPHEN 10-325 MG PO TABS: ORAL_TABLET | ORAL | Status: DC

## 2012-01-09 NOTE — Telephone Encounter (Signed)
Received refill request from [facility] for hydrocodone/apap 10-325. This is a chronic med for the pt. Faxed back to [facility] after approval from Dr Myna Hidalgo.

## 2012-01-13 ENCOUNTER — Telehealth: Payer: Self-pay | Admitting: Hematology & Oncology

## 2012-01-13 ENCOUNTER — Other Ambulatory Visit (HOSPITAL_COMMUNITY): Payer: Medicare Other

## 2012-01-13 ENCOUNTER — Other Ambulatory Visit: Payer: Self-pay | Admitting: Hematology & Oncology

## 2012-01-13 DIAGNOSIS — C50919 Malignant neoplasm of unspecified site of unspecified female breast: Secondary | ICD-10-CM

## 2012-01-13 NOTE — Telephone Encounter (Signed)
I spoke with Mrs. Greenley today.I gave her the results of the PET scan. I told her that the cancer was getting worse. There was a more disease within her liver.  I told her that we were out of options for hormonal manipulation of her cancer. The only treatment option now would be chemotherapy. She has been reluctant to take any chemotherapy because of the side effects. She wants to try to keep working out with her family.  I think that weekly Abraxane would be reasonable. I told her that she would lose her hair. She seemed okay with this.  I would treat her with 3 weeks on and one week off. Again I think this would be reasonable for her.  Mrs. Lortz is a petite woman. However, she has a lot of toughness to her.  I will dose the Abraxane at 100 mg per meter squared per week dose. I do think that she would be okay with this and to handle this.  She says she will give it a try. I w was surprised at this as she's  been reluctant to do anything in the past with regards to chemotherapy.. We have been very fortunate to date. We've been following now for about 3 years.  I will her with 3 three-week cycles and then rescan her. I would clearly know that we are on the right track after 3 cycles of Abraxane.  I will continue to play hard for her.  Pete E.

## 2012-01-20 ENCOUNTER — Other Ambulatory Visit: Payer: Self-pay | Admitting: *Deleted

## 2012-01-20 ENCOUNTER — Ambulatory Visit (HOSPITAL_BASED_OUTPATIENT_CLINIC_OR_DEPARTMENT_OTHER): Payer: Medicare Other

## 2012-01-20 VITALS — BP 127/77 | HR 90 | Temp 97.1°F

## 2012-01-20 DIAGNOSIS — Z5111 Encounter for antineoplastic chemotherapy: Secondary | ICD-10-CM

## 2012-01-20 DIAGNOSIS — F419 Anxiety disorder, unspecified: Secondary | ICD-10-CM

## 2012-01-20 DIAGNOSIS — R112 Nausea with vomiting, unspecified: Secondary | ICD-10-CM

## 2012-01-20 DIAGNOSIS — C50919 Malignant neoplasm of unspecified site of unspecified female breast: Secondary | ICD-10-CM

## 2012-01-20 DIAGNOSIS — C50119 Malignant neoplasm of central portion of unspecified female breast: Secondary | ICD-10-CM

## 2012-01-20 DIAGNOSIS — C7952 Secondary malignant neoplasm of bone marrow: Secondary | ICD-10-CM

## 2012-01-20 DIAGNOSIS — C787 Secondary malignant neoplasm of liver and intrahepatic bile duct: Secondary | ICD-10-CM

## 2012-01-20 MED ORDER — ONDANSETRON HCL 8 MG PO TABS: ORAL_TABLET | ORAL | Status: DC

## 2012-01-20 MED ORDER — PACLITAXEL PROTEIN-BOUND CHEMO INJECTION 100 MG
100.0000 mg/m2 | Freq: Once | INTRAVENOUS | Status: AC
  Administered 2012-01-20: 150 mg via INTRAVENOUS
  Filled 2012-01-20: qty 30

## 2012-01-20 MED ORDER — SODIUM CHLORIDE 0.9 % IV SOLN
Freq: Once | INTRAVENOUS | Status: AC
  Administered 2012-01-20: 15:00:00 via INTRAVENOUS

## 2012-01-20 MED ORDER — DEXAMETHASONE 4 MG PO TABS: ORAL_TABLET | ORAL | Status: DC

## 2012-01-20 MED ORDER — ONDANSETRON 8 MG/50ML IVPB (CHCC)
8.0000 mg | Freq: Once | INTRAVENOUS | Status: AC
  Administered 2012-01-20: 8 mg via INTRAVENOUS

## 2012-01-20 MED ORDER — DEXAMETHASONE SODIUM PHOSPHATE 10 MG/ML IJ SOLN
10.0000 mg | Freq: Once | INTRAMUSCULAR | Status: AC
  Administered 2012-01-20: 10 mg via INTRAVENOUS

## 2012-01-20 MED ORDER — LORAZEPAM 0.5 MG PO TABS: 0.5000 mg | ORAL_TABLET | Freq: Four times a day (QID) | ORAL | Status: AC | PRN

## 2012-01-20 MED ORDER — PROCHLORPERAZINE MALEATE 10 MG PO TABS: 10.0000 mg | ORAL_TABLET | Freq: Four times a day (QID) | ORAL | Status: DC | PRN

## 2012-01-20 MED ORDER — ESZOPICLONE 3 MG PO TABS: 3.0000 mg | ORAL_TABLET | Freq: Every day | ORAL | Status: DC

## 2012-01-20 NOTE — Patient Instructions (Addendum)
Nausea medications for Abraxane:  Zofran (Ondansetron) 8 mg tablets.  Take 1 tablet in the am and one tablet in the pm beginning day after chemotherapy for 2 days.  Dexamethasone (Decadron) 4 mg tablets.   Take 2 tablets (8 mg)  Once daily with food for 2 days after chemotherapy.  Prochlorperazine 10 mg.  Take 1 tablet every 6 hours as needed for nausea.  Nanoparticle Albumin-Bound Paclitaxel injection What is this medicine? NANOPARTICLE ALBUMIN-BOUND PACLITAXEL (Na no PAHR ti kuhl al BYOO muhn-bound PAK li TAX el) is a chemotherapy drug. It targets fast dividing cells, like cancer cells, and causes these cells to die. This medicine is used to treat breast cancer. This medicine may be used for other purposes; ask your health care provider or pharmacist if you have questions. What should I tell my health care provider before I take this medicine? They need to know if you have any of these conditions: -infection (especially a virus infection such as chickenpox, cold sores, or herpes) -kidney disease -liver disease -low blood counts, like low platelets, red blood cells, or white blood cells -recent or ongoing radiation therapy -an unusual or allergic reaction to paclitaxel, albumin, other chemotherapy, other medicines, foods, dyes, or preservatives -pregnant or trying to get pregnant -breast-feeding How should I use this medicine? This drug is given as an infusion into a vein. It is administered in a hospital or clinic by a specially trained health care professional. Talk to your pediatrician regarding the use of this medicine in children. Special care may be needed. Overdosage: If you think you have taken too much of this medicine contact a poison control center or emergency room at once. NOTE: This medicine is only for you. Do not share this medicine with others. What if I miss a dose? It is important not to miss your dose. Call your doctor or health care professional if you are unable to  keep an appointment. What may interact with this medicine? This medicine may also interact with the following medications: -cyclosporine -dexamethasone -diazepam -ketoconazole -medicines to increase blood counts like filgrastim, pegfilgrastim, sargramostim -other chemotherapy drugs like cisplatin, doxorubicin, epirubicin, etoposide, teniposide, vincristine -quinidine -testosterone -vaccines -verapamil Talk to your doctor or health care professional before taking any of these medicines: -acetaminophen -aspirin -ibuprofen -ketoprofen -naproxen This list may not describe all possible interactions. Give your health care provider a list of all the medicines, herbs, non-prescription drugs, or dietary supplements you use. Also tell them if you smoke, drink alcohol, or use illegal drugs. Some items may interact with your medicine. What should I watch for while using this medicine? Your condition will be monitored carefully while you are receiving this medicine. You will need important blood work done while you are taking this medicine. This drug may make you feel generally unwell. This is not uncommon, as chemotherapy can affect healthy cells as well as cancer cells. Report any side effects. Continue your course of treatment even though you feel ill unless your doctor tells you to stop. In some cases, you may be given additional medicines to help with side effects. Follow all directions for their use. Call your doctor or health care professional for advice if you get a fever, chills or sore throat, or other symptoms of a cold or flu. Do not treat yourself. This drug decreases your body's ability to fight infections. Try to avoid being around people who are sick. This medicine may increase your risk to bruise or bleed. Call your doctor or health  care professional if you notice any unusual bleeding. Be careful brushing and flossing your teeth or using a toothpick because you may get an infection or  bleed more easily. If you have any dental work done, tell your dentist you are receiving this medicine. Avoid taking products that contain aspirin, acetaminophen, ibuprofen, naproxen, or ketoprofen unless instructed by your doctor. These medicines may hide a fever. Do not become pregnant while taking this medicine. Women should inform their doctor if they wish to become pregnant or think they might be pregnant. There is a potential for serious side effects to an unborn child. Talk to your health care professional or pharmacist for more information. Do not breast-feed an infant while taking this medicine. Men are advised not to father a child while receiving this medicine. What side effects may I notice from receiving this medicine? Side effects that you should report to your doctor or health care professional as soon as possible: -allergic reactions like skin rash, itching or hives, swelling of the face, lips, or tongue -low blood counts - This drug may decrease the number of white blood cells, red blood cells and platelets. You may be at increased risk for infections and bleeding. -signs of infection - fever or chills, cough, sore throat, pain or difficulty passing urine -signs of decreased platelets or bleeding - bruising, pinpoint red spots on the skin, black, tarry stools, nosebleeds -signs of decreased red blood cells - unusually weak or tired, fainting spells, lightheadedness -breathing problems -changes in vision -chest pain -high or low blood pressure -mouth sores -nausea and vomiting -pain, swelling, redness or irritation at the injection site -pain, tingling, numbness in the hands or feet -slow or irregular heartbeat -swelling of the ankle, feet, hands Side effects that usually do not require medical attention (report to your doctor or health care professional if they continue or are bothersome): -aches, pains -changes in the color of fingernails -diarrhea -hair loss -loss of  appetite This list may not describe all possible side effects. Call your doctor for medical advice about side effects. You may report side effects to FDA at 1-800-FDA-1088. Where should I keep my medicine? This drug is given in a hospital or clinic and will not be stored at home. NOTE: This sheet is a summary. It may not cover all possible information. If you have questions about this medicine, talk to your doctor, pharmacist, or health care provider.  2012, Elsevier/Gold Standard. (05/23/2008 10:52:48 AM)

## 2012-01-27 ENCOUNTER — Other Ambulatory Visit: Payer: Self-pay | Admitting: *Deleted

## 2012-01-27 ENCOUNTER — Ambulatory Visit (HOSPITAL_BASED_OUTPATIENT_CLINIC_OR_DEPARTMENT_OTHER): Payer: Medicare Other

## 2012-01-27 ENCOUNTER — Ambulatory Visit: Payer: Medicare Other

## 2012-01-27 ENCOUNTER — Other Ambulatory Visit (HOSPITAL_BASED_OUTPATIENT_CLINIC_OR_DEPARTMENT_OTHER): Payer: Medicare Other | Admitting: Lab

## 2012-01-27 VITALS — BP 119/68 | HR 76 | Temp 97.2°F

## 2012-01-27 DIAGNOSIS — Z5111 Encounter for antineoplastic chemotherapy: Secondary | ICD-10-CM

## 2012-01-27 DIAGNOSIS — C50919 Malignant neoplasm of unspecified site of unspecified female breast: Secondary | ICD-10-CM

## 2012-01-27 DIAGNOSIS — C7952 Secondary malignant neoplasm of bone marrow: Secondary | ICD-10-CM

## 2012-01-27 DIAGNOSIS — C787 Secondary malignant neoplasm of liver and intrahepatic bile duct: Secondary | ICD-10-CM

## 2012-01-27 DIAGNOSIS — C50119 Malignant neoplasm of central portion of unspecified female breast: Secondary | ICD-10-CM

## 2012-01-27 LAB — CBC WITH DIFFERENTIAL (CANCER CENTER ONLY)
BASO#: 0 10*3/uL (ref 0.0–0.2)
HCT: 35.5 % (ref 34.8–46.6)
HGB: 11.8 g/dL (ref 11.6–15.9)
LYMPH#: 4.1 10*3/uL — ABNORMAL HIGH (ref 0.9–3.3)
MONO#: 0.5 10*3/uL (ref 0.1–0.9)
NEUT#: 5.4 10*3/uL (ref 1.5–6.5)
NEUT%: 53.1 % (ref 39.6–80.0)
RBC: 3.72 10*6/uL (ref 3.70–5.32)
WBC: 10.2 10*3/uL — ABNORMAL HIGH (ref 3.9–10.0)

## 2012-01-27 LAB — TECHNOLOGIST REVIEW CHCC SATELLITE

## 2012-01-27 MED ORDER — ZOLEDRONIC ACID 4 MG/100ML IV SOLN: 4.0000 mg | Freq: Once | INTRAVENOUS | Status: DC

## 2012-01-27 MED ORDER — SODIUM CHLORIDE 0.9 % IV SOLN
Freq: Once | INTRAVENOUS | Status: AC
  Administered 2012-01-27: 13:00:00 via INTRAVENOUS

## 2012-01-27 MED ORDER — FULVESTRANT 250 MG/5ML IM SOLN: 500.0000 mg | INTRAMUSCULAR | Status: DC

## 2012-01-27 MED ORDER — DEXAMETHASONE SODIUM PHOSPHATE 10 MG/ML IJ SOLN
10.0000 mg | Freq: Once | INTRAMUSCULAR | Status: AC
  Administered 2012-01-27: 10 mg via INTRAVENOUS

## 2012-01-27 MED ORDER — ONDANSETRON 8 MG/50ML IVPB (CHCC)
8.0000 mg | Freq: Once | INTRAVENOUS | Status: AC
  Administered 2012-01-27: 8 mg via INTRAVENOUS

## 2012-01-27 MED ORDER — PACLITAXEL PROTEIN-BOUND CHEMO INJECTION 100 MG
100.0000 mg/m2 | Freq: Once | INTRAVENOUS | Status: AC
  Administered 2012-01-27: 150 mg via INTRAVENOUS
  Filled 2012-01-27: qty 30

## 2012-01-27 NOTE — Progress Notes (Signed)
Added labs prior to Abraxane for today and 02/03/12 per pharmacy and Dr Myna Hidalgo.

## 2012-01-27 NOTE — Patient Instructions (Signed)
Paclitaxel injection What is this medicine? PACLITAXEL (PAK li TAX el) is a chemotherapy drug. It targets fast dividing cells, like cancer cells, and causes these cells to die. This medicine is used to treat ovarian cancer, breast cancer, and other cancers. This medicine may be used for other purposes; ask your health care provider or pharmacist if you have questions. What should I tell my health care provider before I take this medicine? They need to know if you have any of these conditions: -blood disorders -irregular heartbeat -infection (especially a virus infection such as chickenpox, cold sores, or herpes) -liver disease -previous or ongoing radiation therapy -an unusual or allergic reaction to paclitaxel, alcohol, polyoxyethylated castor oil, other chemotherapy agents, other medicines, foods, dyes, or preservatives -pregnant or trying to get pregnant -breast-feeding How should I use this medicine? This drug is given as an infusion into a vein. It is administered in a hospital or clinic by a specially trained health care professional. Talk to your pediatrician regarding the use of this medicine in children. Special care may be needed. Overdosage: If you think you have taken too much of this medicine contact a poison control center or emergency room at once. NOTE: This medicine is only for you. Do not share this medicine with others. What if I miss a dose? It is important not to miss your dose. Call your doctor or health care professional if you are unable to keep an appointment. What may interact with this medicine? Do not take this medicine with any of the following medications: -disulfiram -metronidazole This medicine may also interact with the following medications: -cyclosporine -dexamethasone -diazepam -ketoconazole -medicines to increase blood counts like filgrastim, pegfilgrastim, sargramostim -other chemotherapy drugs like cisplatin, doxorubicin, epirubicin, etoposide,  teniposide, vincristine -quinidine -testosterone -vaccines -verapamil Talk to your doctor or health care professional before taking any of these medicines: -acetaminophen -aspirin -ibuprofen -ketoprofen -naproxen This list may not describe all possible interactions. Give your health care provider a list of all the medicines, herbs, non-prescription drugs, or dietary supplements you use. Also tell them if you smoke, drink alcohol, or use illegal drugs. Some items may interact with your medicine. What should I watch for while using this medicine? Your condition will be monitored carefully while you are receiving this medicine. You will need important blood work done while you are taking this medicine. This drug may make you feel generally unwell. This is not uncommon, as chemotherapy can affect healthy cells as well as cancer cells. Report any side effects. Continue your course of treatment even though you feel ill unless your doctor tells you to stop. In some cases, you may be given additional medicines to help with side effects. Follow all directions for their use. Call your doctor or health care professional for advice if you get a fever, chills or sore throat, or other symptoms of a cold or flu. Do not treat yourself. This drug decreases your body's ability to fight infections. Try to avoid being around people who are sick. This medicine may increase your risk to bruise or bleed. Call your doctor or health care professional if you notice any unusual bleeding. Be careful brushing and flossing your teeth or using a toothpick because you may get an infection or bleed more easily. If you have any dental work done, tell your dentist you are receiving this medicine. Avoid taking products that contain aspirin, acetaminophen, ibuprofen, naproxen, or ketoprofen unless instructed by your doctor. These medicines may hide a fever. Do not become pregnant   while taking this medicine. Women should inform their  doctor if they wish to become pregnant or think they might be pregnant. There is a potential for serious side effects to an unborn child. Talk to your health care professional or pharmacist for more information. Do not breast-feed an infant while taking this medicine. Men are advised not to father a child while receiving this medicine. What side effects may I notice from receiving this medicine? Side effects that you should report to your doctor or health care professional as soon as possible: -allergic reactions like skin rash, itching or hives, swelling of the face, lips, or tongue -low blood counts - This drug may decrease the number of white blood cells, red blood cells and platelets. You may be at increased risk for infections and bleeding. -signs of infection - fever or chills, cough, sore throat, pain or difficulty passing urine -signs of decreased platelets or bleeding - bruising, pinpoint red spots on the skin, black, tarry stools, nosebleeds -signs of decreased red blood cells - unusually weak or tired, fainting spells, lightheadedness -breathing problems -chest pain -high or low blood pressure -mouth sores -nausea and vomiting -pain, swelling, redness or irritation at the injection site -pain, tingling, numbness in the hands or feet -slow or irregular heartbeat -swelling of the ankle, feet, hands Side effects that usually do not require medical attention (report to your doctor or health care professional if they continue or are bothersome): -bone pain -complete hair loss including hair on your head, underarms, pubic hair, eyebrows, and eyelashes -changes in the color of fingernails -diarrhea -loosening of the fingernails -loss of appetite -muscle or joint pain -red flush to skin -sweating This list may not describe all possible side effects. Call your doctor for medical advice about side effects. You may report side effects to FDA at 1-800-FDA-1088. Where should I keep my  medicine? This drug is given in a hospital or clinic and will not be stored at home. NOTE: This sheet is a summary. It may not cover all possible information. If you have questions about this medicine, talk to your doctor, pharmacist, or health care provider.  2012, [employer]vier/[employer] Standard. (05/09/2008 11:54:26 AM) 

## 2012-01-30 ENCOUNTER — Telehealth: Payer: Self-pay | Admitting: Nutrition

## 2012-01-30 ENCOUNTER — Ambulatory Visit: Payer: Medicare Other | Admitting: Hematology & Oncology

## 2012-01-30 ENCOUNTER — Other Ambulatory Visit: Payer: Medicare Other | Admitting: Lab

## 2012-01-30 NOTE — Telephone Encounter (Signed)
Patient called complaining of difficulty consuming enough protein.  She feels bloated and has been trying to eat smaller meals more often.  I briefly educated her on strategies for increasing calories and protein and will mail her some information at her request.  Patient is appreciative.

## 2012-02-03 ENCOUNTER — Ambulatory Visit (HOSPITAL_BASED_OUTPATIENT_CLINIC_OR_DEPARTMENT_OTHER): Payer: Medicare Other

## 2012-02-03 ENCOUNTER — Ambulatory Visit: Payer: Medicare Other

## 2012-02-03 ENCOUNTER — Other Ambulatory Visit (HOSPITAL_BASED_OUTPATIENT_CLINIC_OR_DEPARTMENT_OTHER): Payer: Medicare Other | Admitting: Lab

## 2012-02-03 VITALS — BP 125/60 | HR 80 | Temp 97.9°F | Resp 16

## 2012-02-03 DIAGNOSIS — C787 Secondary malignant neoplasm of liver and intrahepatic bile duct: Secondary | ICD-10-CM

## 2012-02-03 DIAGNOSIS — C50119 Malignant neoplasm of central portion of unspecified female breast: Secondary | ICD-10-CM

## 2012-02-03 DIAGNOSIS — C7951 Secondary malignant neoplasm of bone: Secondary | ICD-10-CM

## 2012-02-03 DIAGNOSIS — C50919 Malignant neoplasm of unspecified site of unspecified female breast: Secondary | ICD-10-CM

## 2012-02-03 DIAGNOSIS — Z5111 Encounter for antineoplastic chemotherapy: Secondary | ICD-10-CM

## 2012-02-03 LAB — CBC WITH DIFFERENTIAL (CANCER CENTER ONLY)
Eosinophils Absolute: 0.1 10*3/uL (ref 0.0–0.5)
HCT: 35.5 % (ref 34.8–46.6)
LYMPH#: 3.9 10*3/uL — ABNORMAL HIGH (ref 0.9–3.3)
LYMPH%: 39.1 % (ref 14.0–48.0)
MCV: 96 fL (ref 81–101)
MONO#: 0.5 10*3/uL (ref 0.1–0.9)
NEUT%: 54.1 % (ref 39.6–80.0)
RBC: 3.7 10*6/uL (ref 3.70–5.32)
WBC: 10 10*3/uL (ref 3.9–10.0)

## 2012-02-03 LAB — CMP (CANCER CENTER ONLY)
AST: 26 U/L (ref 11–38)
Albumin: 3.3 g/dL (ref 3.3–5.5)
BUN, Bld: 15 mg/dL (ref 7–22)
CO2: 27 mEq/L (ref 18–33)
Calcium: 8.9 mg/dL (ref 8.0–10.3)
Chloride: 102 mEq/L (ref 98–108)
Creat: 0.4 mg/dl — ABNORMAL LOW (ref 0.6–1.2)
Glucose, Bld: 158 mg/dL — ABNORMAL HIGH (ref 73–118)
Potassium: 4.2 mEq/L (ref 3.3–4.7)

## 2012-02-03 LAB — LACTATE DEHYDROGENASE: LDH: 218 U/L (ref 94–250)

## 2012-02-03 MED ORDER — SODIUM CHLORIDE 0.9 % IV SOLN
Freq: Once | INTRAVENOUS | Status: AC
  Administered 2012-02-03: 15:00:00 via INTRAVENOUS

## 2012-02-03 MED ORDER — ONDANSETRON 8 MG/50ML IVPB (CHCC)
8.0000 mg | Freq: Once | INTRAVENOUS | Status: AC
  Administered 2012-02-03: 8 mg via INTRAVENOUS

## 2012-02-03 MED ORDER — DEXAMETHASONE SODIUM PHOSPHATE 10 MG/ML IJ SOLN
10.0000 mg | Freq: Once | INTRAMUSCULAR | Status: AC
  Administered 2012-02-03: 10 mg via INTRAVENOUS

## 2012-02-03 MED ORDER — PACLITAXEL PROTEIN-BOUND CHEMO INJECTION 100 MG
100.0000 mg/m2 | Freq: Once | INTRAVENOUS | Status: AC
  Administered 2012-02-03: 150 mg via INTRAVENOUS
  Filled 2012-02-03: qty 30

## 2012-02-03 NOTE — Patient Instructions (Signed)
Paclitaxel injection What is this medicine? PACLITAXEL (PAK li TAX el) is a chemotherapy drug. It targets fast dividing cells, like cancer cells, and causes these cells to die. This medicine is used to treat ovarian cancer, breast cancer, and other cancers. This medicine may be used for other purposes; ask your health care provider or pharmacist if you have questions. What should I tell my health care provider before I take this medicine? They need to know if you have any of these conditions: -blood disorders -irregular heartbeat -infection (especially a virus infection such as chickenpox, cold sores, or herpes) -liver disease -previous or ongoing radiation therapy -an unusual or allergic reaction to paclitaxel, alcohol, polyoxyethylated castor oil, other chemotherapy agents, other medicines, foods, dyes, or preservatives -pregnant or trying to get pregnant -breast-feeding How should I use this medicine? This drug is given as an infusion into a vein. It is administered in a hospital or clinic by a specially trained health care professional. Talk to your pediatrician regarding the use of this medicine in children. Special care may be needed. Overdosage: If you think you have taken too much of this medicine contact a poison control center or emergency room at once. NOTE: This medicine is only for you. Do not share this medicine with others. What if I miss a dose? It is important not to miss your dose. Call your doctor or health care professional if you are unable to keep an appointment. What may interact with this medicine? Do not take this medicine with any of the following medications: -disulfiram -metronidazole This medicine may also interact with the following medications: -cyclosporine -dexamethasone -diazepam -ketoconazole -medicines to increase blood counts like filgrastim, pegfilgrastim, sargramostim -other chemotherapy drugs like cisplatin, doxorubicin, epirubicin, etoposide,  teniposide, vincristine -quinidine -testosterone -vaccines -verapamil Talk to your doctor or health care professional before taking any of these medicines: -acetaminophen -aspirin -ibuprofen -ketoprofen -naproxen This list may not describe all possible interactions. Give your health care provider a list of all the medicines, herbs, non-prescription drugs, or dietary supplements you use. Also tell them if you smoke, drink alcohol, or use illegal drugs. Some items may interact with your medicine. What should I watch for while using this medicine? Your condition will be monitored carefully while you are receiving this medicine. You will need important blood work done while you are taking this medicine. This drug may make you feel generally unwell. This is not uncommon, as chemotherapy can affect healthy cells as well as cancer cells. Report any side effects. Continue your course of treatment even though you feel ill unless your doctor tells you to stop. In some cases, you may be given additional medicines to help with side effects. Follow all directions for their use. Call your doctor or health care professional for advice if you get a fever, chills or sore throat, or other symptoms of a cold or flu. Do not treat yourself. This drug decreases your body's ability to fight infections. Try to avoid being around people who are sick. This medicine may increase your risk to bruise or bleed. Call your doctor or health care professional if you notice any unusual bleeding. Be careful brushing and flossing your teeth or using a toothpick because you may get an infection or bleed more easily. If you have any dental work done, tell your dentist you are receiving this medicine. Avoid taking products that contain aspirin, acetaminophen, ibuprofen, naproxen, or ketoprofen unless instructed by your doctor. These medicines may hide a fever. Do not become pregnant   while taking this medicine. Women should inform their  doctor if they wish to become pregnant or think they might be pregnant. There is a potential for serious side effects to an unborn child. Talk to your health care professional or pharmacist for more information. Do not breast-feed an infant while taking this medicine. Men are advised not to father a child while receiving this medicine. What side effects may I notice from receiving this medicine? Side effects that you should report to your doctor or health care professional as soon as possible: -allergic reactions like skin rash, itching or hives, swelling of the face, lips, or tongue -low blood counts - This drug may decrease the number of white blood cells, red blood cells and platelets. You may be at increased risk for infections and bleeding. -signs of infection - fever or chills, cough, sore throat, pain or difficulty passing urine -signs of decreased platelets or bleeding - bruising, pinpoint red spots on the skin, black, tarry stools, nosebleeds -signs of decreased red blood cells - unusually weak or tired, fainting spells, lightheadedness -breathing problems -chest pain -high or low blood pressure -mouth sores -nausea and vomiting -pain, swelling, redness or irritation at the injection site -pain, tingling, numbness in the hands or feet -slow or irregular heartbeat -swelling of the ankle, feet, hands Side effects that usually do not require medical attention (report to your doctor or health care professional if they continue or are bothersome): -bone pain -complete hair loss including hair on your head, underarms, pubic hair, eyebrows, and eyelashes -changes in the color of fingernails -diarrhea -loosening of the fingernails -loss of appetite -muscle or joint pain -red flush to skin -sweating This list may not describe all possible side effects. Call your doctor for medical advice about side effects. You may report side effects to FDA at 1-800-FDA-1088. Where should I keep my  medicine? This drug is given in a hospital or clinic and will not be stored at home. NOTE: This sheet is a summary. It may not cover all possible information. If you have questions about this medicine, talk to your doctor, pharmacist, or health care provider.  2012, [employer]vier/[employer] Standard. (05/09/2008 11:54:26 AM) 

## 2012-02-04 ENCOUNTER — Other Ambulatory Visit: Payer: Self-pay | Admitting: Family Medicine

## 2012-02-11 ENCOUNTER — Ambulatory Visit: Payer: Medicare Other | Admitting: Hematology & Oncology

## 2012-02-11 ENCOUNTER — Other Ambulatory Visit: Payer: Medicare Other | Admitting: Lab

## 2012-02-11 ENCOUNTER — Ambulatory Visit: Payer: Medicare Other

## 2012-02-18 ENCOUNTER — Ambulatory Visit (HOSPITAL_BASED_OUTPATIENT_CLINIC_OR_DEPARTMENT_OTHER): Payer: Medicare Other

## 2012-02-18 ENCOUNTER — Other Ambulatory Visit (HOSPITAL_BASED_OUTPATIENT_CLINIC_OR_DEPARTMENT_OTHER): Payer: Medicare Other | Admitting: Lab

## 2012-02-18 ENCOUNTER — Ambulatory Visit (HOSPITAL_BASED_OUTPATIENT_CLINIC_OR_DEPARTMENT_OTHER): Payer: Medicare Other | Admitting: Hematology & Oncology

## 2012-02-18 VITALS — BP 135/67 | HR 80 | Temp 97.8°F | Resp 18 | Ht 59.0 in | Wt 116.0 lb

## 2012-02-18 DIAGNOSIS — F419 Anxiety disorder, unspecified: Secondary | ICD-10-CM

## 2012-02-18 DIAGNOSIS — C50119 Malignant neoplasm of central portion of unspecified female breast: Secondary | ICD-10-CM

## 2012-02-18 DIAGNOSIS — C773 Secondary and unspecified malignant neoplasm of axilla and upper limb lymph nodes: Secondary | ICD-10-CM

## 2012-02-18 DIAGNOSIS — C50919 Malignant neoplasm of unspecified site of unspecified female breast: Secondary | ICD-10-CM

## 2012-02-18 DIAGNOSIS — Z5111 Encounter for antineoplastic chemotherapy: Secondary | ICD-10-CM

## 2012-02-18 DIAGNOSIS — C7951 Secondary malignant neoplasm of bone: Secondary | ICD-10-CM

## 2012-02-18 DIAGNOSIS — C7952 Secondary malignant neoplasm of bone marrow: Secondary | ICD-10-CM

## 2012-02-18 DIAGNOSIS — C787 Secondary malignant neoplasm of liver and intrahepatic bile duct: Secondary | ICD-10-CM

## 2012-02-18 LAB — COMPREHENSIVE METABOLIC PANEL
ALT: 17 U/L (ref 0–35)
AST: 20 U/L (ref 0–37)
Albumin: 4.3 g/dL (ref 3.5–5.2)
Calcium: 9.7 mg/dL (ref 8.4–10.5)
Chloride: 105 mEq/L (ref 96–112)
Potassium: 4 mEq/L (ref 3.5–5.3)

## 2012-02-18 LAB — CBC WITH DIFFERENTIAL (CANCER CENTER ONLY)
BASO#: 0 10*3/uL (ref 0.0–0.2)
EOS%: 1.6 % (ref 0.0–7.0)
Eosinophils Absolute: 0.1 10*3/uL (ref 0.0–0.5)
HCT: 36.6 % (ref 34.8–46.6)
HGB: 12 g/dL (ref 11.6–15.9)
LYMPH#: 2.6 10*3/uL (ref 0.9–3.3)
MCHC: 32.8 g/dL (ref 32.0–36.0)
NEUT%: 59.9 % (ref 39.6–80.0)
RBC: 3.77 10*6/uL (ref 3.70–5.32)

## 2012-02-18 MED ORDER — ONDANSETRON 8 MG/50ML IVPB (CHCC)
8.0000 mg | Freq: Once | INTRAVENOUS | Status: AC
  Administered 2012-02-18: 8 mg via INTRAVENOUS

## 2012-02-18 MED ORDER — PACLITAXEL PROTEIN-BOUND CHEMO INJECTION 100 MG
100.0000 mg/m2 | Freq: Once | INTRAVENOUS | Status: AC
  Administered 2012-02-18: 150 mg via INTRAVENOUS
  Filled 2012-02-18: qty 30

## 2012-02-18 MED ORDER — HYDROCODONE-ACETAMINOPHEN 10-325 MG PO TABS: ORAL_TABLET | ORAL | Status: DC

## 2012-02-18 MED ORDER — ZOLEDRONIC ACID 4 MG/5ML IV CONC
Freq: Once | INTRAVENOUS | Status: AC
  Administered 2012-02-18: 12:00:00 via INTRAVENOUS
  Filled 2012-02-18: qty 5

## 2012-02-18 MED ORDER — SODIUM CHLORIDE 0.9 % IV SOLN
Freq: Once | INTRAVENOUS | Status: AC
  Administered 2012-02-18: 13:00:00 via INTRAVENOUS

## 2012-02-18 MED ORDER — ESZOPICLONE 3 MG PO TABS: 3.0000 mg | ORAL_TABLET | Freq: Every day | ORAL | Status: DC

## 2012-02-18 MED ORDER — MEGESTROL ACETATE 40 MG PO TABS: 40.0000 mg | ORAL_TABLET | Freq: Every day | ORAL | Status: DC

## 2012-02-18 MED ORDER — DEXAMETHASONE SODIUM PHOSPHATE 10 MG/ML IJ SOLN
10.0000 mg | Freq: Once | INTRAMUSCULAR | Status: AC
  Administered 2012-02-18: 10 mg via INTRAVENOUS

## 2012-02-18 NOTE — Progress Notes (Signed)
CC:   Anna Perla, DO  DIAGNOSIS:  Metastatic breast cancer.  CURRENT THERAPY: 1. The patient is status post 1 cycle of weekly Abraxane. 2. Zometa 4 mg IV q. month.  INTERIM HISTORY:  Anna Benjamin comes in for followup.  She tolerated her first cycle of Abraxane fairly well.  She feels okay.  She has not had any problems with nausea or vomiting.  She has had no cough.  There has been no increase in bony pain.  She has had no bleeding.  Her CA27-29 29 went from 1300 down to 1240.  She has had no leg swelling.  There have been no rashes.  PHYSICAL EXAMINATION:  This is a very petite white female in no obvious distress.  Vital signs:  Temperature of 97.8, pulse 80, respiratory rate 18, blood pressure 135/67.  Weight is 116.  Head and neck: Normocephalic, atraumatic skull.  There are no ocular or oral lesions. There are no palpable cervical or supraclavicular lymph nodes.  Lungs: Clear bilaterally.  Cardiac:  Regular rate and rhythm with a normal S1 and S2.  There are no murmurs, rubs or bruits.  Abdomen:  Soft with good bowel sounds.  There is no palpable abdominal mass.  There is no palpable hepatosplenomegaly.  Back:  No tenderness over the spine, ribs, or hips.  Extremities:  No clubbing, cyanosis or edema.  Neurologic:  No focal neurological deficits.  LABORATORY STUDIES:  White cell count is 8.9, hemoglobin 12, hematocrit 37, platelet count 209.  IMPRESSION:  Anna Benjamin is a 76 year old white female with metastatic breast cancer.  We have been treating her for several years.  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  We will hopefully get her a good response with the Abraxane.  I am glad that she is tolerating the chemo well.  We will go ahead and start her next 3-week cycle.  We will give will give her Zometa today.  I will plan to get her back to see [place] in another month for followup.    ______________________________ Anna Benjamin, M.D. PRE/MEDQ   D:  02/18/2012  T:  02/18/2012  Job:  [id number]

## 2012-02-18 NOTE — Patient Instructions (Signed)
Paclitaxel injection What is this medicine? PACLITAXEL (PAK li TAX el) is a chemotherapy drug. It targets fast dividing cells, like cancer cells, and causes these cells to die. This medicine is used to treat ovarian cancer, breast cancer, and other cancers. This medicine may be used for other purposes; ask your health care provider or pharmacist if you have questions. What should I tell my health care provider before I take this medicine? They need to know if you have any of these conditions: -blood disorders -irregular heartbeat -infection (especially a virus infection such as chickenpox, cold sores, or herpes) -liver disease -previous or ongoing radiation therapy -an unusual or allergic reaction to paclitaxel, alcohol, polyoxyethylated castor oil, other chemotherapy agents, other medicines, foods, dyes, or preservatives -pregnant or trying to get pregnant -breast-feeding How should I use this medicine? This drug is given as an infusion into a vein. It is administered in a hospital or clinic by a specially trained health care professional. Talk to your pediatrician regarding the use of this medicine in children. Special care may be needed. Overdosage: If you think you have taken too much of this medicine contact a poison control center or emergency room at once. NOTE: This medicine is only for you. Do not share this medicine with others. What if I miss a dose? It is important not to miss your dose. Call your doctor or health care professional if you are unable to keep an appointment. What may interact with this medicine? Do not take this medicine with any of the following medications: -disulfiram -metronidazole This medicine may also interact with the following medications: -cyclosporine -dexamethasone -diazepam -ketoconazole -medicines to increase blood counts like filgrastim, pegfilgrastim, sargramostim -other chemotherapy drugs like cisplatin, doxorubicin, epirubicin, etoposide,  teniposide, vincristine -quinidine -testosterone -vaccines -verapamil Talk to your doctor or health care professional before taking any of these medicines: -acetaminophen -aspirin -ibuprofen -ketoprofen -naproxen This list may not describe all possible interactions. Give your health care provider a list of all the medicines, herbs, non-prescription drugs, or dietary supplements you use. Also tell them if you smoke, drink alcohol, or use illegal drugs. Some items may interact with your medicine. What should I watch for while using this medicine? Your condition will be monitored carefully while you are receiving this medicine. You will need important blood work done while you are taking this medicine. This drug may make you feel generally unwell. This is not uncommon, as chemotherapy can affect healthy cells as well as cancer cells. Report any side effects. Continue your course of treatment even though you feel ill unless your doctor tells you to stop. In some cases, you may be given additional medicines to help with side effects. Follow all directions for their use. Call your doctor or health care professional for advice if you get a fever, chills or sore throat, or other symptoms of a cold or flu. Do not treat yourself. This drug decreases your body's ability to fight infections. Try to avoid being around people who are sick. This medicine may increase your risk to bruise or bleed. Call your doctor or health care professional if you notice any unusual bleeding. Be careful brushing and flossing your teeth or using a toothpick because you may get an infection or bleed more easily. If you have any dental work done, tell your dentist you are receiving this medicine. Avoid taking products that contain aspirin, acetaminophen, ibuprofen, naproxen, or ketoprofen unless instructed by your doctor. These medicines may hide a fever. Do not become pregnant   while taking this medicine. Women should inform their  doctor if they wish to become pregnant or think they might be pregnant. There is a potential for serious side effects to an unborn child. Talk to your health care professional or pharmacist for more information. Do not breast-feed an infant while taking this medicine. Men are advised not to father a child while receiving this medicine. What side effects may I notice from receiving this medicine? Side effects that you should report to your doctor or health care professional as soon as possible: -allergic reactions like skin rash, itching or hives, swelling of the face, lips, or tongue -low blood counts - This drug may decrease the number of white blood cells, red blood cells and platelets. You may be at increased risk for infections and bleeding. -signs of infection - fever or chills, cough, sore throat, pain or difficulty passing urine -signs of decreased platelets or bleeding - bruising, pinpoint red spots on the skin, black, tarry stools, nosebleeds -signs of decreased red blood cells - unusually weak or tired, fainting spells, lightheadedness -breathing problems -chest pain -high or low blood pressure -mouth sores -nausea and vomiting -pain, swelling, redness or irritation at the injection site -pain, tingling, numbness in the hands or feet -slow or irregular heartbeat -swelling of the ankle, feet, hands Side effects that usually do not require medical attention (report to your doctor or health care professional if they continue or are bothersome): -bone pain -complete hair loss including hair on your head, underarms, pubic hair, eyebrows, and eyelashes -changes in the color of fingernails -diarrhea -loosening of the fingernails -loss of appetite -muscle or joint pain -red flush to skin -sweating This list may not describe all possible side effects. Call your doctor for medical advice about side effects. You may report side effects to FDA at 1-800-FDA-1088. Where should I keep my  medicine? This drug is given in a hospital or clinic and will not be stored at home. NOTE: This sheet is a summary. It may not cover all possible information. If you have questions about this medicine, talk to your doctor, pharmacist, or health care provider.  2012, [employer]vier/[employer] Standard. (05/09/2008 11:54:26 AM) 

## 2012-02-18 NOTE — Progress Notes (Signed)
This office note has been dictated.

## 2012-02-19 ENCOUNTER — Telehealth: Payer: Self-pay | Admitting: *Deleted

## 2012-02-19 NOTE — Telephone Encounter (Signed)
Called patient to let her know that her tumor level is still dropping.  Reviewed concept of tumor markers and what her levels were.

## 2012-02-19 NOTE — Telephone Encounter (Signed)
Message copied by Anselm Jungling on Wed Feb 19, 2012 10:16 AM ------      Message from: Josph Macho      Created: Tue Feb 18, 2012  7:57 PM       Call - Tumor level is still dropping.  Went from 1250 down to 1000!!  Excellent!!! Cindee Lame

## 2012-02-24 ENCOUNTER — Ambulatory Visit (HOSPITAL_BASED_OUTPATIENT_CLINIC_OR_DEPARTMENT_OTHER): Payer: Medicare Other

## 2012-02-24 ENCOUNTER — Other Ambulatory Visit (HOSPITAL_BASED_OUTPATIENT_CLINIC_OR_DEPARTMENT_OTHER): Payer: Medicare Other | Admitting: Lab

## 2012-02-24 VITALS — BP 116/71 | HR 77 | Temp 97.0°F | Resp 18

## 2012-02-24 DIAGNOSIS — C7951 Secondary malignant neoplasm of bone: Secondary | ICD-10-CM

## 2012-02-24 DIAGNOSIS — C50119 Malignant neoplasm of central portion of unspecified female breast: Secondary | ICD-10-CM

## 2012-02-24 DIAGNOSIS — C50919 Malignant neoplasm of unspecified site of unspecified female breast: Secondary | ICD-10-CM

## 2012-02-24 DIAGNOSIS — C787 Secondary malignant neoplasm of liver and intrahepatic bile duct: Secondary | ICD-10-CM

## 2012-02-24 DIAGNOSIS — Z5111 Encounter for antineoplastic chemotherapy: Secondary | ICD-10-CM

## 2012-02-24 LAB — CBC WITH DIFFERENTIAL (CANCER CENTER ONLY)
BASO#: 0 10*3/uL (ref 0.0–0.2)
BASO%: 0.2 % (ref 0.0–2.0)
EOS%: 1.6 % (ref 0.0–7.0)
HGB: 11.7 g/dL (ref 11.6–15.9)
LYMPH#: 3.5 10*3/uL — ABNORMAL HIGH (ref 0.9–3.3)
MCH: 32.4 pg (ref 26.0–34.0)
MCHC: 33.1 g/dL (ref 32.0–36.0)
MONO%: 5.2 % (ref 0.0–13.0)
NEUT#: 5.1 10*3/uL (ref 1.5–6.5)
Platelets: 233 10*3/uL (ref 145–400)

## 2012-02-24 MED ORDER — ONDANSETRON 8 MG/50ML IVPB (CHCC)
8.0000 mg | Freq: Once | INTRAVENOUS | Status: AC
  Administered 2012-02-24: 8 mg via INTRAVENOUS

## 2012-02-24 MED ORDER — DEXAMETHASONE SODIUM PHOSPHATE 10 MG/ML IJ SOLN
10.0000 mg | Freq: Once | INTRAMUSCULAR | Status: AC
  Administered 2012-02-24: 10 mg via INTRAVENOUS

## 2012-02-24 MED ORDER — PACLITAXEL PROTEIN-BOUND CHEMO INJECTION 100 MG
100.0000 mg/m2 | Freq: Once | INTRAVENOUS | Status: AC
  Administered 2012-02-24: 150 mg via INTRAVENOUS
  Filled 2012-02-24: qty 30

## 2012-02-24 MED ORDER — SODIUM CHLORIDE 0.9 % IV SOLN
Freq: Once | INTRAVENOUS | Status: AC
  Administered 2012-02-24: 11:00:00 via INTRAVENOUS

## 2012-02-24 NOTE — Patient Instructions (Addendum)
Paclitaxel injection What is this medicine? PACLITAXEL (PAK li TAX el) is a chemotherapy drug. It targets fast dividing cells, like cancer cells, and causes these cells to die. This medicine is used to treat ovarian cancer, breast cancer, and other cancers. This medicine may be used for other purposes; ask your health care provider or pharmacist if you have questions. What should I tell my health care provider before I take this medicine? They need to know if you have any of these conditions: -blood disorders -irregular heartbeat -infection (especially a virus infection such as chickenpox, cold sores, or herpes) -liver disease -previous or ongoing radiation therapy -an unusual or allergic reaction to paclitaxel, alcohol, polyoxyethylated castor oil, other chemotherapy agents, other medicines, foods, dyes, or preservatives -pregnant or trying to get pregnant -breast-feeding How should I use this medicine? This drug is given as an infusion into a vein. It is administered in a hospital or clinic by a specially trained health care professional. Talk to your pediatrician regarding the use of this medicine in children. Special care may be needed. Overdosage: If you think you have taken too much of this medicine contact a poison control center or emergency room at once. NOTE: This medicine is only for you. Do not share this medicine with others. What if I miss a dose? It is important not to miss your dose. Call your doctor or health care professional if you are unable to keep an appointment. What may interact with this medicine? Do not take this medicine with any of the following medications: -disulfiram -metronidazole This medicine may also interact with the following medications: -cyclosporine -dexamethasone -diazepam -ketoconazole -medicines to increase blood counts like filgrastim, pegfilgrastim, sargramostim -other chemotherapy drugs like cisplatin, doxorubicin, epirubicin, etoposide,  teniposide, vincristine -quinidine -testosterone -vaccines -verapamil Talk to your doctor or health care professional before taking any of these medicines: -acetaminophen -aspirin -ibuprofen -ketoprofen -naproxen This list may not describe all possible interactions. Give your health care provider a list of all the medicines, herbs, non-prescription drugs, or dietary supplements you use. Also tell them if you smoke, drink alcohol, or use illegal drugs. Some items may interact with your medicine. What should I watch for while using this medicine? Your condition will be monitored carefully while you are receiving this medicine. You will need important blood work done while you are taking this medicine. This drug may make you feel generally unwell. This is not uncommon, as chemotherapy can affect healthy cells as well as cancer cells. Report any side effects. Continue your course of treatment even though you feel ill unless your doctor tells you to stop. In some cases, you may be given additional medicines to help with side effects. Follow all directions for their use. Call your doctor or health care professional for advice if you get a fever, chills or sore throat, or other symptoms of a cold or flu. Do not treat yourself. This drug decreases your body's ability to fight infections. Try to avoid being around people who are sick. This medicine may increase your risk to bruise or bleed. Call your doctor or health care professional if you notice any unusual bleeding. Be careful brushing and flossing your teeth or using a toothpick because you may get an infection or bleed more easily. If you have any dental work done, tell your dentist you are receiving this medicine. Avoid taking products that contain aspirin, acetaminophen, ibuprofen, naproxen, or ketoprofen unless instructed by your doctor. These medicines may hide a fever. Do not become pregnant   while taking this medicine. Women should inform their  doctor if they wish to become pregnant or think they might be pregnant. There is a potential for serious side effects to an unborn child. Talk to your health care professional or pharmacist for more information. Do not breast-feed an infant while taking this medicine. Men are advised not to father a child while receiving this medicine. What side effects may I notice from receiving this medicine? Side effects that you should report to your doctor or health care professional as soon as possible: -allergic reactions like skin rash, itching or hives, swelling of the face, lips, or tongue -low blood counts - This drug may decrease the number of white blood cells, red blood cells and platelets. You may be at increased risk for infections and bleeding. -signs of infection - fever or chills, cough, sore throat, pain or difficulty passing urine -signs of decreased platelets or bleeding - bruising, pinpoint red spots on the skin, black, tarry stools, nosebleeds -signs of decreased red blood cells - unusually weak or tired, fainting spells, lightheadedness -breathing problems -chest pain -high or low blood pressure -mouth sores -nausea and vomiting -pain, swelling, redness or irritation at the injection site -pain, tingling, numbness in the hands or feet -slow or irregular heartbeat -swelling of the ankle, feet, hands Side effects that usually do not require medical attention (report to your doctor or health care professional if they continue or are bothersome): -bone pain -complete hair loss including hair on your head, underarms, pubic hair, eyebrows, and eyelashes -changes in the color of fingernails -diarrhea -loosening of the fingernails -loss of appetite -muscle or joint pain -red flush to skin -sweating This list may not describe all possible side effects. Call your doctor for medical advice about side effects. You may report side effects to FDA at 1-800-FDA-1088. Where should I keep my  medicine? This drug is given in a hospital or clinic and will not be stored at home. NOTE: This sheet is a summary. It may not cover all possible information. If you have questions about this medicine, talk to your doctor, pharmacist, or health care provider.  2012, [employer]vier/[employer] Standard. (05/09/2008 11:54:26 AM) 

## 2012-03-02 ENCOUNTER — Other Ambulatory Visit (HOSPITAL_BASED_OUTPATIENT_CLINIC_OR_DEPARTMENT_OTHER): Payer: Medicare Other | Admitting: Lab

## 2012-03-02 ENCOUNTER — Ambulatory Visit (HOSPITAL_BASED_OUTPATIENT_CLINIC_OR_DEPARTMENT_OTHER): Payer: Medicare Other

## 2012-03-02 VITALS — BP 112/62 | HR 65 | Temp 97.1°F | Resp 18

## 2012-03-02 DIAGNOSIS — C50119 Malignant neoplasm of central portion of unspecified female breast: Secondary | ICD-10-CM

## 2012-03-02 DIAGNOSIS — C7951 Secondary malignant neoplasm of bone: Secondary | ICD-10-CM

## 2012-03-02 DIAGNOSIS — C50919 Malignant neoplasm of unspecified site of unspecified female breast: Secondary | ICD-10-CM

## 2012-03-02 DIAGNOSIS — C7952 Secondary malignant neoplasm of bone marrow: Secondary | ICD-10-CM

## 2012-03-02 DIAGNOSIS — C787 Secondary malignant neoplasm of liver and intrahepatic bile duct: Secondary | ICD-10-CM

## 2012-03-02 DIAGNOSIS — Z5111 Encounter for antineoplastic chemotherapy: Secondary | ICD-10-CM

## 2012-03-02 LAB — CBC WITH DIFFERENTIAL (CANCER CENTER ONLY)
BASO#: 0 10*3/uL (ref 0.0–0.2)
Eosinophils Absolute: 0.2 10*3/uL (ref 0.0–0.5)
HGB: 12 g/dL (ref 11.6–15.9)
LYMPH#: 3.5 10*3/uL — ABNORMAL HIGH (ref 0.9–3.3)
MCH: 32.2 pg (ref 26.0–34.0)
MONO#: 0.6 10*3/uL (ref 0.1–0.9)
MONO%: 7.7 % (ref 0.0–13.0)
NEUT#: 3.3 10*3/uL (ref 1.5–6.5)
Platelets: 239 10*3/uL (ref 145–400)
RBC: 3.73 10*6/uL (ref 3.70–5.32)
WBC: 7.6 10*3/uL (ref 3.9–10.0)

## 2012-03-02 MED ORDER — DEXAMETHASONE SODIUM PHOSPHATE 10 MG/ML IJ SOLN
10.0000 mg | Freq: Once | INTRAMUSCULAR | Status: AC
  Administered 2012-03-02: 10 mg via INTRAVENOUS

## 2012-03-02 MED ORDER — SODIUM CHLORIDE 0.9 % IV SOLN
Freq: Once | INTRAVENOUS | Status: AC
  Administered 2012-03-02: 11:00:00 via INTRAVENOUS

## 2012-03-02 MED ORDER — PACLITAXEL PROTEIN-BOUND CHEMO INJECTION 100 MG
100.0000 mg/m2 | Freq: Once | INTRAVENOUS | Status: AC
  Administered 2012-03-02: 150 mg via INTRAVENOUS
  Filled 2012-03-02: qty 30

## 2012-03-02 MED ORDER — ONDANSETRON 8 MG/50ML IVPB (CHCC)
8.0000 mg | Freq: Once | INTRAVENOUS | Status: AC
  Administered 2012-03-02: 8 mg via INTRAVENOUS

## 2012-03-02 NOTE — Patient Instructions (Signed)
Paclitaxel injection What is this medicine? PACLITAXEL (PAK li TAX el) is a chemotherapy drug. It targets fast dividing cells, like cancer cells, and causes these cells to die. This medicine is used to treat ovarian cancer, breast cancer, and other cancers. This medicine may be used for other purposes; ask your health care provider or pharmacist if you have questions. What should I tell my health care provider before I take this medicine? They need to know if you have any of these conditions: -blood disorders -irregular heartbeat -infection (especially a virus infection such as chickenpox, cold sores, or herpes) -liver disease -previous or ongoing radiation therapy -an unusual or allergic reaction to paclitaxel, alcohol, polyoxyethylated castor oil, other chemotherapy agents, other medicines, foods, dyes, or preservatives -pregnant or trying to get pregnant -breast-feeding How should I use this medicine? This drug is given as an infusion into a vein. It is administered in a hospital or clinic by a specially trained health care professional. Talk to your pediatrician regarding the use of this medicine in children. Special care may be needed. Overdosage: If you think you have taken too much of this medicine contact a poison control center or emergency room at once. NOTE: This medicine is only for you. Do not share this medicine with others. What if I miss a dose? It is important not to miss your dose. Call your doctor or health care professional if you are unable to keep an appointment. What may interact with this medicine? Do not take this medicine with any of the following medications: -disulfiram -metronidazole This medicine may also interact with the following medications: -cyclosporine -dexamethasone -diazepam -ketoconazole -medicines to increase blood counts like filgrastim, pegfilgrastim, sargramostim -other chemotherapy drugs like cisplatin, doxorubicin, epirubicin, etoposide,  teniposide, vincristine -quinidine -testosterone -vaccines -verapamil Talk to your doctor or health care professional before taking any of these medicines: -acetaminophen -aspirin -ibuprofen -ketoprofen -naproxen This list may not describe all possible interactions. Give your health care provider a list of all the medicines, herbs, non-prescription drugs, or dietary supplements you use. Also tell them if you smoke, drink alcohol, or use illegal drugs. Some items may interact with your medicine. What should I watch for while using this medicine? Your condition will be monitored carefully while you are receiving this medicine. You will need important blood work done while you are taking this medicine. This drug may make you feel generally unwell. This is not uncommon, as chemotherapy can affect healthy cells as well as cancer cells. Report any side effects. Continue your course of treatment even though you feel ill unless your doctor tells you to stop. In some cases, you may be given additional medicines to help with side effects. Follow all directions for their use. Call your doctor or health care professional for advice if you get a fever, chills or sore throat, or other symptoms of a cold or flu. Do not treat yourself. This drug decreases your body's ability to fight infections. Try to avoid being around people who are sick. This medicine may increase your risk to bruise or bleed. Call your doctor or health care professional if you notice any unusual bleeding. Be careful brushing and flossing your teeth or using a toothpick because you may get an infection or bleed more easily. If you have any dental work done, tell your dentist you are receiving this medicine. Avoid taking products that contain aspirin, acetaminophen, ibuprofen, naproxen, or ketoprofen unless instructed by your doctor. These medicines may hide a fever. Do not become pregnant   while taking this medicine. Women should inform their  doctor if they wish to become pregnant or think they might be pregnant. There is a potential for serious side effects to an unborn child. Talk to your health care professional or pharmacist for more information. Do not breast-feed an infant while taking this medicine. Men are advised not to father a child while receiving this medicine. What side effects may I notice from receiving this medicine? Side effects that you should report to your doctor or health care professional as soon as possible: -allergic reactions like skin rash, itching or hives, swelling of the face, lips, or tongue -low blood counts - This drug may decrease the number of white blood cells, red blood cells and platelets. You may be at increased risk for infections and bleeding. -signs of infection - fever or chills, cough, sore throat, pain or difficulty passing urine -signs of decreased platelets or bleeding - bruising, pinpoint red spots on the skin, black, tarry stools, nosebleeds -signs of decreased red blood cells - unusually weak or tired, fainting spells, lightheadedness -breathing problems -chest pain -high or low blood pressure -mouth sores -nausea and vomiting -pain, swelling, redness or irritation at the injection site -pain, tingling, numbness in the hands or feet -slow or irregular heartbeat -swelling of the ankle, feet, hands Side effects that usually do not require medical attention (report to your doctor or health care professional if they continue or are bothersome): -bone pain -complete hair loss including hair on your head, underarms, pubic hair, eyebrows, and eyelashes -changes in the color of fingernails -diarrhea -loosening of the fingernails -loss of appetite -muscle or joint pain -red flush to skin -sweating This list may not describe all possible side effects. Call your doctor for medical advice about side effects. You may report side effects to FDA at 1-800-FDA-1088. Where should I keep my  medicine? This drug is given in a hospital or clinic and will not be stored at home. NOTE: This sheet is a summary. It may not cover all possible information. If you have questions about this medicine, talk to your doctor, pharmacist, or health care provider.  2012, [employer]vier/[employer] Standard. (05/09/2008 11:54:26 AM) 

## 2012-03-12 ENCOUNTER — Other Ambulatory Visit: Payer: Self-pay | Admitting: Family Medicine

## 2012-03-13 NOTE — Telephone Encounter (Signed)
Currently receiving TX for Breast cancer. Last seen 0/16/12 requesting Lisinopril. Please advise      KP

## 2012-03-17 ENCOUNTER — Ambulatory Visit (HOSPITAL_BASED_OUTPATIENT_CLINIC_OR_DEPARTMENT_OTHER): Payer: Medicare Other | Admitting: Hematology & Oncology

## 2012-03-17 ENCOUNTER — Other Ambulatory Visit (HOSPITAL_BASED_OUTPATIENT_CLINIC_OR_DEPARTMENT_OTHER): Payer: Medicare Other | Admitting: Lab

## 2012-03-17 ENCOUNTER — Ambulatory Visit (HOSPITAL_BASED_OUTPATIENT_CLINIC_OR_DEPARTMENT_OTHER): Payer: Medicare Other

## 2012-03-17 VITALS — BP 127/58 | HR 70 | Temp 97.5°F | Resp 16 | Ht 59.0 in | Wt 116.0 lb

## 2012-03-17 DIAGNOSIS — M810 Age-related osteoporosis without current pathological fracture: Secondary | ICD-10-CM

## 2012-03-17 DIAGNOSIS — Z5111 Encounter for antineoplastic chemotherapy: Secondary | ICD-10-CM

## 2012-03-17 DIAGNOSIS — C50919 Malignant neoplasm of unspecified site of unspecified female breast: Secondary | ICD-10-CM

## 2012-03-17 DIAGNOSIS — C773 Secondary and unspecified malignant neoplasm of axilla and upper limb lymph nodes: Secondary | ICD-10-CM

## 2012-03-17 DIAGNOSIS — C50119 Malignant neoplasm of central portion of unspecified female breast: Secondary | ICD-10-CM

## 2012-03-17 DIAGNOSIS — C7952 Secondary malignant neoplasm of bone marrow: Secondary | ICD-10-CM

## 2012-03-17 DIAGNOSIS — C7951 Secondary malignant neoplasm of bone: Secondary | ICD-10-CM

## 2012-03-17 DIAGNOSIS — G4701 Insomnia due to medical condition: Secondary | ICD-10-CM

## 2012-03-17 LAB — CBC WITH DIFFERENTIAL (CANCER CENTER ONLY)
Eosinophils Absolute: 0.1 10*3/uL (ref 0.0–0.5)
LYMPH#: 3.5 10*3/uL — ABNORMAL HIGH (ref 0.9–3.3)
MONO#: 0.7 10*3/uL (ref 0.1–0.9)
NEUT#: 4.6 10*3/uL (ref 1.5–6.5)
Platelets: 189 10*3/uL (ref 145–400)
RBC: 3.84 10*6/uL (ref 3.70–5.32)
WBC: 8.9 10*3/uL (ref 3.9–10.0)

## 2012-03-17 LAB — CMP (CANCER CENTER ONLY)
ALT(SGPT): 17 U/L (ref 10–47)
CO2: 27 mEq/L (ref 18–33)
Calcium: 9.1 mg/dL (ref 8.0–10.3)
Chloride: 102 mEq/L (ref 98–108)
Sodium: 138 mEq/L (ref 128–145)
Total Protein: 6.5 g/dL (ref 6.4–8.1)

## 2012-03-17 LAB — CANCER ANTIGEN 27.29: CA 27.29: 739 U/mL — ABNORMAL HIGH (ref 0–39)

## 2012-03-17 MED ORDER — SODIUM CHLORIDE 0.9 % IV SOLN
Freq: Once | INTRAVENOUS | Status: AC
  Administered 2012-03-17: 11:00:00 via INTRAVENOUS

## 2012-03-17 MED ORDER — DOXEPIN HCL 6 MG PO TABS: 6.0000 mg | ORAL_TABLET | Freq: Every evening | ORAL | Status: DC | PRN

## 2012-03-17 MED ORDER — DEXAMETHASONE SODIUM PHOSPHATE 10 MG/ML IJ SOLN
10.0000 mg | Freq: Once | INTRAMUSCULAR | Status: AC
  Administered 2012-03-17: 10 mg via INTRAVENOUS

## 2012-03-17 MED ORDER — PACLITAXEL PROTEIN-BOUND CHEMO INJECTION 100 MG
100.0000 mg/m2 | Freq: Once | INTRAVENOUS | Status: AC
  Administered 2012-03-17: 150 mg via INTRAVENOUS
  Filled 2012-03-17: qty 30

## 2012-03-17 MED ORDER — ONDANSETRON 8 MG/50ML IVPB (CHCC)
8.0000 mg | Freq: Once | INTRAVENOUS | Status: AC
  Administered 2012-03-17: 8 mg via INTRAVENOUS

## 2012-03-17 MED ORDER — ZOLEDRONIC ACID 4 MG/5ML IV CONC
4.0000 mg | Freq: Once | INTRAVENOUS | Status: AC
  Administered 2012-03-17: 4 mg via INTRAVENOUS
  Filled 2012-03-17: qty 5

## 2012-03-17 NOTE — Addendum Note (Signed)
Addended by: Arlan Organ R on: 03/17/2012 10:09 AM   Modules accepted: Orders, Medications

## 2012-03-17 NOTE — Progress Notes (Signed)
CC:   Lelon Perla, DO  DIAGNOSIS:  Metastatic breast cancer.  CURRENT THERAPY: 1. The patient is status post 1 cycle of Abraxane (3 weeks on/1 week     off). 2. Zometa 4 mg IV q.month.  INTERIM HISTORY:  Anna Benjamin comes in for her followup.  She really looks well.  Her tumor marker continues to improve.  When we last checked it in September it was down to 1083.  She is having no abdominal pain.  Her appetite has been improving.  She has had no leg swelling.  There have been no rashes.  She has had no cough.  She has had no headache.  She is just very frustrated at home.  Her son is not working.  She says that he stays on the computer 16 hours a day.  He just will not go look for a job.  This really is causing a lot of stress for her.  PHYSICAL EXAMINATION:  General:  This is a petite white female in no obvious distress.  Vital signs:  97.5, pulse 70, respiratory rate 16, blood pressure 127/58, weight is 116.  Head and neck:  Normocephalic, atraumatic skull.  There are no ocular or oral lesions.  There are no palpable cervical or supraclavicular lymph nodes.  Lungs:  Clear bilaterally.  Cardiac:  Regular rate and rhythm with a normal S1, S2. There are no murmurs, rubs or bruits.  Abdomen:  Soft with good bowel sounds.  There is no palpable abdominal mass.  There is no palpable hepatosplenomegaly.  Back:  Shows some kyphosis.  There is no tenderness over the spine, ribs or hips.  Extremities:  Shows no clubbing, cyanosis or edema.  Neurological:  Shows no focal neurological deficit.  LABORATORY STUDIES:  White cell count 8.9, hemoglobin 12.6, hematocrit 37.5, platelet count 189.  IMPRESSION:  Anna Benjamin is a 76 year old white female with metastatic breast cancer.  Again, by her CA27.29 she is responding nicely.  We will go ahead with her first cycle of chemo.  Because of the tumor marker improving, I think we can hold off on doing any scans on her until she has her  4th cycle.  We will go ahead and plan to see her back ourselves in 1 month.  We will go ahead and give her Zometa today.    ______________________________ Josph Macho, M.D. PRE/MEDQ  D:  03/17/2012  T:  03/17/2012  Job:  [id number]

## 2012-03-17 NOTE — Patient Instructions (Signed)
Zoledronic Acid injection (Hypercalcemia, Oncology) What is this medicine? ZOLEDRONIC ACID (ZOE le dron ik AS id) lowers the amount of calcium loss from bone. It is used to treat too much calcium in your blood from cancer. It is also used to prevent complications of cancer that has spread to the bone. This medicine may be used for other purposes; ask your health care provider or pharmacist if you have questions. What should I tell my health care provider before I take this medicine? They need to know if you have any of these conditions: -aspirin-sensitive asthma -dental disease -kidney disease -an unusual or allergic reaction to zoledronic acid, other medicines, foods, dyes, or preservatives -pregnant or trying to get pregnant -breast-feeding How should I use this medicine? This medicine is for infusion into a vein. It is given by a health care professional in a hospital or clinic setting. Talk to your pediatrician regarding the use of this medicine in children. Special care may be needed. Overdosage: If you think you have taken too much of this medicine contact a poison control center or emergency room at once. NOTE: This medicine is only for you. Do not share this medicine with others. What if I miss a dose? It is important not to miss your dose. Call your doctor or health care professional if you are unable to keep an appointment. What may interact with this medicine? -certain antibiotics given by injection -NSAIDs, medicines for pain and inflammation, like ibuprofen or naproxen -some diuretics like bumetanide, furosemide -teriparatide -thalidomide This list may not describe all possible interactions. Give your health care provider a list of all the medicines, herbs, non-prescription drugs, or dietary supplements you use. Also tell them if you smoke, drink alcohol, or use illegal drugs. Some items may interact with your medicine. What should I watch for while using this medicine? Visit  your doctor or health care professional for regular checkups. It may be some time before you see the benefit from this medicine. Do not stop taking your medicine unless your doctor tells you to. Your doctor may order blood tests or other tests to see how you are doing. Women should inform their doctor if they wish to become pregnant or think they might be pregnant. There is a potential for serious side effects to an unborn child. Talk to your health care professional or pharmacist for more information. You should make sure that you get enough calcium and vitamin D while you are taking this medicine. Discuss the foods you eat and the vitamins you take with your health care professional. Some people who take this medicine have severe bone, joint, and/or muscle pain. This medicine may also increase your risk for a broken thigh bone. Tell your doctor right away if you have pain in your upper leg or groin. Tell your doctor if you have any pain that does not go away or that gets worse. What side effects may I notice from receiving this medicine? Side effects that you should report to your doctor or health care professional as soon as possible: -allergic reactions like skin rash, itching or hives, swelling of the face, lips, or tongue -anxiety, confusion, or depression -breathing problems -changes in vision -feeling faint or lightheaded, falls -jaw burning, cramping, pain -muscle cramps, stiffness, or weakness -trouble passing urine or change in the amount of urine Side effects that usually do not require medical attention (report to your doctor or health care professional if they continue or are bothersome): -bone, joint, or muscle pain -  fever -hair loss -irritation at site where injected -loss of appetite -nausea, vomiting -stomach upset -tired This list may not describe all possible side effects. Call your doctor for medical advice about side effects. You may report side effects to FDA at  1-800-FDA-1088. Where should I keep my medicine? This drug is given in a hospital or clinic and will not be stored at home. NOTE: This sheet is a summary. It may not cover all possible information. If you have questions about this medicine, talk to your doctor, pharmacist, or health care provider.  2012, [employer]vier/Gold Standard. (11/23/2010 9:06:58 AM)    Paclitaxel injection What is this medicine? PACLITAXEL (PAK li TAX el) is a chemotherapy drug. It targets fast dividing cells, like cancer cells, and causes these cells to die. This medicine is used to treat ovarian cancer, breast cancer, and other cancers. This medicine may be used for other purposes; ask your health care provider or pharmacist if you have questions. What should I tell my health care provider before I take this medicine? They need to know if you have any of these conditions: -blood disorders -irregular heartbeat -infection (especially a virus infection such as chickenpox, cold sores, or herpes) -liver disease -previous or ongoing radiation therapy -an unusual or allergic reaction to paclitaxel, alcohol, polyoxyethylated castor oil, other chemotherapy agents, other medicines, foods, dyes, or preservatives -pregnant or trying to get pregnant -breast-feeding How should I use this medicine? This drug is given as an infusion into a vein. It is administered in a hospital or clinic by a specially trained health care professional. Talk to your pediatrician regarding the use of this medicine in children. Special care may be needed. Overdosage: If you think you have taken too much of this medicine contact a poison control center or emergency room at once. NOTE: This medicine is only for you. Do not share this medicine with others. What if I miss a dose? It is important not to miss your dose. Call your doctor or health care professional if you are unable to keep an appointment. What may interact with this medicine? Do not take  this medicine with any of the following medications: -disulfiram -metronidazole This medicine may also interact with the following medications: -cyclosporine -dexamethasone -diazepam -ketoconazole -medicines to increase blood counts like filgrastim, pegfilgrastim, sargramostim -other chemotherapy drugs like cisplatin, doxorubicin, epirubicin, etoposide, teniposide, vincristine -quinidine -testosterone -vaccines -verapamil Talk to your doctor or health care professional before taking any of these medicines: -acetaminophen -aspirin -ibuprofen -ketoprofen -naproxen This list may not describe all possible interactions. Give your health care provider a list of all the medicines, herbs, non-prescription drugs, or dietary supplements you use. Also tell them if you smoke, drink alcohol, or use illegal drugs. Some items may interact with your medicine. What should I watch for while using this medicine? Your condition will be monitored carefully while you are receiving this medicine. You will need important blood work done while you are taking this medicine. This drug may make you feel generally unwell. This is not uncommon, as chemotherapy can affect healthy cells as well as cancer cells. Report any side effects. Continue your course of treatment even though you feel ill unless your doctor tells you to stop. In some cases, you may be given additional medicines to help with side effects. Follow all directions for their use. Call your doctor or health care professional for advice if you get a fever, chills or sore throat, or other symptoms of a cold or flu. Do not  treat yourself. This drug decreases your body's ability to fight infections. Try to avoid being around people who are sick. This medicine may increase your risk to bruise or bleed. Call your doctor or health care professional if you notice any unusual bleeding. Be careful brushing and flossing your teeth or using a toothpick because you may  get an infection or bleed more easily. If you have any dental work done, tell your dentist you are receiving this medicine. Avoid taking products that contain aspirin, acetaminophen, ibuprofen, naproxen, or ketoprofen unless instructed by your doctor. These medicines may hide a fever. Do not become pregnant while taking this medicine. Women should inform their doctor if they wish to become pregnant or think they might be pregnant. There is a potential for serious side effects to an unborn child. Talk to your health care professional or pharmacist for more information. Do not breast-feed an infant while taking this medicine. Men are advised not to father a child while receiving this medicine. What side effects may I notice from receiving this medicine? Side effects that you should report to your doctor or health care professional as soon as possible: -allergic reactions like skin rash, itching or hives, swelling of the face, lips, or tongue -low blood counts - This drug may decrease the number of white blood cells, red blood cells and platelets. You may be at increased risk for infections and bleeding. -signs of infection - fever or chills, cough, sore throat, pain or difficulty passing urine -signs of decreased platelets or bleeding - bruising, pinpoint red spots on the skin, black, tarry stools, nosebleeds -signs of decreased red blood cells - unusually weak or tired, fainting spells, lightheadedness -breathing problems -chest pain -high or low blood pressure -mouth sores -nausea and vomiting -pain, swelling, redness or irritation at the injection site -pain, tingling, numbness in the hands or feet -slow or irregular heartbeat -swelling of the ankle, feet, hands Side effects that usually do not require medical attention (report to your doctor or health care professional if they continue or are bothersome): -bone pain -complete hair loss including hair on your head, underarms, pubic hair,  eyebrows, and eyelashes -changes in the color of fingernails -diarrhea -loosening of the fingernails -loss of appetite -muscle or joint pain -red flush to skin -sweating This list may not describe all possible side effects. Call your doctor for medical advice about side effects. You may report side effects to FDA at 1-800-FDA-1088. Where should I keep my medicine? This drug is given in a hospital or clinic and will not be stored at home. NOTE: This sheet is a summary. It may not cover all possible information. If you have questions about this medicine, talk to your doctor, pharmacist, or health care provider.  2012, [employer]vier/Gold Standard. (05/09/2008 11:54:26 AM)

## 2012-03-17 NOTE — Progress Notes (Signed)
This office note has been dictated.

## 2012-03-23 ENCOUNTER — Other Ambulatory Visit (HOSPITAL_BASED_OUTPATIENT_CLINIC_OR_DEPARTMENT_OTHER): Payer: Medicare Other | Admitting: Lab

## 2012-03-23 ENCOUNTER — Ambulatory Visit (HOSPITAL_BASED_OUTPATIENT_CLINIC_OR_DEPARTMENT_OTHER): Payer: Medicare Other

## 2012-03-23 VITALS — BP 106/52 | HR 69 | Temp 98.0°F | Resp 18

## 2012-03-23 DIAGNOSIS — C50919 Malignant neoplasm of unspecified site of unspecified female breast: Secondary | ICD-10-CM

## 2012-03-23 DIAGNOSIS — C7952 Secondary malignant neoplasm of bone marrow: Secondary | ICD-10-CM

## 2012-03-23 DIAGNOSIS — C773 Secondary and unspecified malignant neoplasm of axilla and upper limb lymph nodes: Secondary | ICD-10-CM

## 2012-03-23 DIAGNOSIS — C50119 Malignant neoplasm of central portion of unspecified female breast: Secondary | ICD-10-CM

## 2012-03-23 DIAGNOSIS — C7951 Secondary malignant neoplasm of bone: Secondary | ICD-10-CM

## 2012-03-23 DIAGNOSIS — Z5111 Encounter for antineoplastic chemotherapy: Secondary | ICD-10-CM

## 2012-03-23 LAB — CBC WITH DIFFERENTIAL (CANCER CENTER ONLY)
BASO#: 0 10*3/uL (ref 0.0–0.2)
EOS%: 0.4 % (ref 0.0–7.0)
Eosinophils Absolute: 0 10*3/uL (ref 0.0–0.5)
HCT: 35.5 % (ref 34.8–46.6)
HGB: 11.7 g/dL (ref 11.6–15.9)
LYMPH#: 5.1 10*3/uL — ABNORMAL HIGH (ref 0.9–3.3)
MCH: 32.4 pg (ref 26.0–34.0)
MCHC: 33 g/dL (ref 32.0–36.0)
MONO%: 4.2 % (ref 0.0–13.0)
NEUT%: 43.4 % (ref 39.6–80.0)

## 2012-03-23 MED ORDER — PACLITAXEL PROTEIN-BOUND CHEMO INJECTION 100 MG
100.0000 mg/m2 | Freq: Once | INTRAVENOUS | Status: AC
  Administered 2012-03-23: 150 mg via INTRAVENOUS
  Filled 2012-03-23: qty 30

## 2012-03-23 MED ORDER — ONDANSETRON 8 MG/50ML IVPB (CHCC)
8.0000 mg | Freq: Once | INTRAVENOUS | Status: AC
  Administered 2012-03-23: 8 mg via INTRAVENOUS

## 2012-03-23 MED ORDER — SODIUM CHLORIDE 0.9 % IV SOLN
Freq: Once | INTRAVENOUS | Status: AC
  Administered 2012-03-23: 12:00:00 via INTRAVENOUS

## 2012-03-23 MED ORDER — DEXAMETHASONE SODIUM PHOSPHATE 10 MG/ML IJ SOLN
10.0000 mg | Freq: Once | INTRAMUSCULAR | Status: AC
  Administered 2012-03-23: 10 mg via INTRAVENOUS

## 2012-03-23 NOTE — Patient Instructions (Signed)

## 2012-03-30 ENCOUNTER — Other Ambulatory Visit (HOSPITAL_BASED_OUTPATIENT_CLINIC_OR_DEPARTMENT_OTHER): Payer: Medicare Other | Admitting: Lab

## 2012-03-30 ENCOUNTER — Ambulatory Visit (HOSPITAL_BASED_OUTPATIENT_CLINIC_OR_DEPARTMENT_OTHER): Payer: Medicare Other

## 2012-03-30 VITALS — BP 122/67 | HR 76 | Temp 97.3°F | Resp 20

## 2012-03-30 DIAGNOSIS — Z5111 Encounter for antineoplastic chemotherapy: Secondary | ICD-10-CM

## 2012-03-30 DIAGNOSIS — C50119 Malignant neoplasm of central portion of unspecified female breast: Secondary | ICD-10-CM

## 2012-03-30 DIAGNOSIS — C50919 Malignant neoplasm of unspecified site of unspecified female breast: Secondary | ICD-10-CM

## 2012-03-30 DIAGNOSIS — C773 Secondary and unspecified malignant neoplasm of axilla and upper limb lymph nodes: Secondary | ICD-10-CM

## 2012-03-30 DIAGNOSIS — C7952 Secondary malignant neoplasm of bone marrow: Secondary | ICD-10-CM

## 2012-03-30 LAB — CBC WITH DIFFERENTIAL (CANCER CENTER ONLY)
BASO#: 0 10*3/uL (ref 0.0–0.2)
EOS%: 1.5 % (ref 0.0–7.0)
HCT: 37.6 % (ref 34.8–46.6)
HGB: 12.3 g/dL (ref 11.6–15.9)
LYMPH#: 3.8 10*3/uL — ABNORMAL HIGH (ref 0.9–3.3)
MCHC: 32.7 g/dL (ref 32.0–36.0)
MONO#: 0.5 10*3/uL (ref 0.1–0.9)
NEUT#: 2.9 10*3/uL (ref 1.5–6.5)
NEUT%: 39.2 % — ABNORMAL LOW (ref 39.6–80.0)
RBC: 3.79 10*6/uL (ref 3.70–5.32)
WBC: 7.3 10*3/uL (ref 3.9–10.0)

## 2012-03-30 MED ORDER — SODIUM CHLORIDE 0.9 % IV SOLN
Freq: Once | INTRAVENOUS | Status: AC
  Administered 2012-03-30: 10:00:00 via INTRAVENOUS

## 2012-03-30 MED ORDER — PACLITAXEL PROTEIN-BOUND CHEMO INJECTION 100 MG
100.0000 mg/m2 | Freq: Once | INTRAVENOUS | Status: AC
  Administered 2012-03-30: 150 mg via INTRAVENOUS
  Filled 2012-03-30: qty 30

## 2012-03-30 MED ORDER — ONDANSETRON 8 MG/50ML IVPB (CHCC)
8.0000 mg | Freq: Once | INTRAVENOUS | Status: AC
  Administered 2012-03-30: 8 mg via INTRAVENOUS

## 2012-03-30 MED ORDER — DEXAMETHASONE SODIUM PHOSPHATE 10 MG/ML IJ SOLN
10.0000 mg | Freq: Once | INTRAMUSCULAR | Status: AC
  Administered 2012-03-30: 10 mg via INTRAVENOUS

## 2012-03-30 NOTE — Patient Instructions (Signed)
Paclitaxel injection What is this medicine? PACLITAXEL (PAK li TAX el) is a chemotherapy drug. It targets fast dividing cells, like cancer cells, and causes these cells to die. This medicine is used to treat ovarian cancer, breast cancer, and other cancers. This medicine may be used for other purposes; ask your health care provider or pharmacist if you have questions. What should I tell my health care provider before I take this medicine? They need to know if you have any of these conditions: -blood disorders -irregular heartbeat -infection (especially a virus infection such as chickenpox, cold sores, or herpes) -liver disease -previous or ongoing radiation therapy -an unusual or allergic reaction to paclitaxel, alcohol, polyoxyethylated castor oil, other chemotherapy agents, other medicines, foods, dyes, or preservatives -pregnant or trying to get pregnant -breast-feeding How should I use this medicine? This drug is given as an infusion into a vein. It is administered in a hospital or clinic by a specially trained health care professional. Talk to your pediatrician regarding the use of this medicine in children. Special care may be needed. Overdosage: If you think you have taken too much of this medicine contact a poison control center or emergency room at once. NOTE: This medicine is only for you. Do not share this medicine with others. What if I miss a dose? It is important not to miss your dose. Call your doctor or health care professional if you are unable to keep an appointment. What may interact with this medicine? Do not take this medicine with any of the following medications: -disulfiram -metronidazole This medicine may also interact with the following medications: -cyclosporine -dexamethasone -diazepam -ketoconazole -medicines to increase blood counts like filgrastim, pegfilgrastim, sargramostim -other chemotherapy drugs like cisplatin, doxorubicin, epirubicin, etoposide,  teniposide, vincristine -quinidine -testosterone -vaccines -verapamil Talk to your doctor or health care professional before taking any of these medicines: -acetaminophen -aspirin -ibuprofen -ketoprofen -naproxen This list may not describe all possible interactions. Give your health care provider a list of all the medicines, herbs, non-prescription drugs, or dietary supplements you use. Also tell them if you smoke, drink alcohol, or use illegal drugs. Some items may interact with your medicine. What should I watch for while using this medicine? Your condition will be monitored carefully while you are receiving this medicine. You will need important blood work done while you are taking this medicine. This drug may make you feel generally unwell. This is not uncommon, as chemotherapy can affect healthy cells as well as cancer cells. Report any side effects. Continue your course of treatment even though you feel ill unless your doctor tells you to stop. In some cases, you may be given additional medicines to help with side effects. Follow all directions for their use. Call your doctor or health care professional for advice if you get a fever, chills or sore throat, or other symptoms of a cold or flu. Do not treat yourself. This drug decreases your body's ability to fight infections. Try to avoid being around people who are sick. This medicine may increase your risk to bruise or bleed. Call your doctor or health care professional if you notice any unusual bleeding. Be careful brushing and flossing your teeth or using a toothpick because you may get an infection or bleed more easily. If you have any dental work done, tell your dentist you are receiving this medicine. Avoid taking products that contain aspirin, acetaminophen, ibuprofen, naproxen, or ketoprofen unless instructed by your doctor. These medicines may hide a fever. Do not become pregnant   while taking this medicine. Women should inform their  doctor if they wish to become pregnant or think they might be pregnant. There is a potential for serious side effects to an unborn child. Talk to your health care professional or pharmacist for more information. Do not breast-feed an infant while taking this medicine. Men are advised not to father a child while receiving this medicine. What side effects may I notice from receiving this medicine? Side effects that you should report to your doctor or health care professional as soon as possible: -allergic reactions like skin rash, itching or hives, swelling of the face, lips, or tongue -low blood counts - This drug may decrease the number of white blood cells, red blood cells and platelets. You may be at increased risk for infections and bleeding. -signs of infection - fever or chills, cough, sore throat, pain or difficulty passing urine -signs of decreased platelets or bleeding - bruising, pinpoint red spots on the skin, black, tarry stools, nosebleeds -signs of decreased red blood cells - unusually weak or tired, fainting spells, lightheadedness -breathing problems -chest pain -high or low blood pressure -mouth sores -nausea and vomiting -pain, swelling, redness or irritation at the injection site -pain, tingling, numbness in the hands or feet -slow or irregular heartbeat -swelling of the ankle, feet, hands Side effects that usually do not require medical attention (report to your doctor or health care professional if they continue or are bothersome): -bone pain -complete hair loss including hair on your head, underarms, pubic hair, eyebrows, and eyelashes -changes in the color of fingernails -diarrhea -loosening of the fingernails -loss of appetite -muscle or joint pain -red flush to skin -sweating This list may not describe all possible side effects. Call your doctor for medical advice about side effects. You may report side effects to FDA at 1-800-FDA-1088. Where should I keep my  medicine? This drug is given in a hospital or clinic and will not be stored at home. NOTE: This sheet is a summary. It may not cover all possible information. If you have questions about this medicine, talk to your doctor, pharmacist, or health care provider.  2012, [employer]vier/[employer] Standard. (05/09/2008 11:54:26 AM) 

## 2012-03-31 ENCOUNTER — Other Ambulatory Visit: Payer: Self-pay | Admitting: *Deleted

## 2012-03-31 DIAGNOSIS — F419 Anxiety disorder, unspecified: Secondary | ICD-10-CM

## 2012-03-31 DIAGNOSIS — C50919 Malignant neoplasm of unspecified site of unspecified female breast: Secondary | ICD-10-CM

## 2012-03-31 MED ORDER — HYDROCODONE-ACETAMINOPHEN 10-325 MG PO TABS: ORAL_TABLET | ORAL | Status: DC

## 2012-03-31 NOTE — Telephone Encounter (Signed)
Received refill request from [facility] for hydrocodone/apap 10-325. This is a chronic med for the pt. Faxed back to [employer] approval & signature from [place], [place].

## 2012-04-13 ENCOUNTER — Other Ambulatory Visit (HOSPITAL_BASED_OUTPATIENT_CLINIC_OR_DEPARTMENT_OTHER): Payer: Medicare Other | Admitting: Lab

## 2012-04-13 ENCOUNTER — Ambulatory Visit (HOSPITAL_BASED_OUTPATIENT_CLINIC_OR_DEPARTMENT_OTHER): Payer: Medicare Other | Admitting: Medical

## 2012-04-13 ENCOUNTER — Other Ambulatory Visit: Payer: Self-pay | Admitting: Medical

## 2012-04-13 ENCOUNTER — Ambulatory Visit (HOSPITAL_BASED_OUTPATIENT_CLINIC_OR_DEPARTMENT_OTHER): Payer: Medicare Other

## 2012-04-13 VITALS — BP 137/63 | HR 82 | Temp 97.6°F | Resp 18 | Ht 59.0 in | Wt 120.0 lb

## 2012-04-13 DIAGNOSIS — C50119 Malignant neoplasm of central portion of unspecified female breast: Secondary | ICD-10-CM

## 2012-04-13 DIAGNOSIS — C50919 Malignant neoplasm of unspecified site of unspecified female breast: Secondary | ICD-10-CM

## 2012-04-13 DIAGNOSIS — M549 Dorsalgia, unspecified: Secondary | ICD-10-CM

## 2012-04-13 DIAGNOSIS — Z5111 Encounter for antineoplastic chemotherapy: Secondary | ICD-10-CM

## 2012-04-13 DIAGNOSIS — C787 Secondary malignant neoplasm of liver and intrahepatic bile duct: Secondary | ICD-10-CM

## 2012-04-13 DIAGNOSIS — C7952 Secondary malignant neoplasm of bone marrow: Secondary | ICD-10-CM

## 2012-04-13 DIAGNOSIS — C7951 Secondary malignant neoplasm of bone: Secondary | ICD-10-CM

## 2012-04-13 DIAGNOSIS — Z23 Encounter for immunization: Secondary | ICD-10-CM

## 2012-04-13 DIAGNOSIS — G4700 Insomnia, unspecified: Secondary | ICD-10-CM

## 2012-04-13 DIAGNOSIS — M810 Age-related osteoporosis without current pathological fracture: Secondary | ICD-10-CM

## 2012-04-13 LAB — CMP (CANCER CENTER ONLY)
Albumin: 4 g/dL (ref 3.3–5.5)
BUN, Bld: 15 mg/dL (ref 7–22)
CO2: 29 mEq/L (ref 18–33)
Glucose, Bld: 101 mg/dL (ref 73–118)
Potassium: 4.1 mEq/L (ref 3.3–4.7)
Sodium: 141 mEq/L (ref 128–145)
Total Protein: 7.1 g/dL (ref 6.4–8.1)

## 2012-04-13 LAB — CBC WITH DIFFERENTIAL (CANCER CENTER ONLY)
BASO#: 0 10*3/uL (ref 0.0–0.2)
Eosinophils Absolute: 0 10*3/uL (ref 0.0–0.5)
HGB: 12.6 g/dL (ref 11.6–15.9)
LYMPH%: 39.3 % (ref 14.0–48.0)
MCH: 32.6 pg (ref 26.0–34.0)
MCV: 99 fL (ref 81–101)
MONO#: 0.6 10*3/uL (ref 0.1–0.9)
MONO%: 7.3 % (ref 0.0–13.0)
NEUT#: 4.6 10*3/uL (ref 1.5–6.5)
Platelets: 191 10*3/uL (ref 145–400)
RBC: 3.87 10*6/uL (ref 3.70–5.32)
WBC: 8.8 10*3/uL (ref 3.9–10.0)

## 2012-04-13 MED ORDER — INFLUENZA VIRUS VACC SPLIT PF IM SUSP
0.5000 mL | Freq: Once | INTRAMUSCULAR | Status: DC
  Filled 2012-04-13: qty 0.5

## 2012-04-13 MED ORDER — PACLITAXEL PROTEIN-BOUND CHEMO INJECTION 100 MG
100.0000 mg/m2 | Freq: Once | INTRAVENOUS | Status: AC
  Administered 2012-04-13: 150 mg via INTRAVENOUS
  Filled 2012-04-13: qty 30

## 2012-04-13 MED ORDER — ONDANSETRON 8 MG/50ML IVPB (CHCC)
8.0000 mg | Freq: Once | INTRAVENOUS | Status: AC
  Administered 2012-04-13: 8 mg via INTRAVENOUS

## 2012-04-13 MED ORDER — DEXAMETHASONE SODIUM PHOSPHATE 10 MG/ML IJ SOLN
10.0000 mg | Freq: Once | INTRAMUSCULAR | Status: AC
  Administered 2012-04-13: 10 mg via INTRAVENOUS

## 2012-04-13 MED ORDER — SODIUM CHLORIDE 0.9 % IV SOLN
Freq: Once | INTRAVENOUS | Status: AC
  Administered 2012-04-13: 13:00:00 via INTRAVENOUS

## 2012-04-13 MED ORDER — INFLUENZA VIRUS VACC SPLIT PF IM SUSP
0.5000 mL | Freq: Once | INTRAMUSCULAR | Status: AC
  Administered 2012-04-13: 0.5 mL via INTRAMUSCULAR
  Filled 2012-04-13: qty 0.5

## 2012-04-13 MED ORDER — ZOLEDRONIC ACID 4 MG/5ML IV CONC
4.0000 mg | Freq: Once | INTRAVENOUS | Status: AC
  Administered 2012-04-13: 4 mg via INTRAVENOUS
  Filled 2012-04-13: qty 5

## 2012-04-13 MED ORDER — ESZOPICLONE 3 MG PO TABS: 3.0000 mg | ORAL_TABLET | Freq: Every day | ORAL | Status: DC

## 2012-04-13 NOTE — Patient Instructions (Signed)
Paclitaxel injection What is this medicine? PACLITAXEL (PAK li TAX el) is a chemotherapy drug. It targets fast dividing cells, like cancer cells, and causes these cells to die. This medicine is used to treat ovarian cancer, breast cancer, and other cancers. This medicine may be used for other purposes; ask your health care provider or pharmacist if you have questions. What should I tell my health care provider before I take this medicine? They need to know if you have any of these conditions: -blood disorders -irregular heartbeat -infection (especially a virus infection such as chickenpox, cold sores, or herpes) -liver disease -previous or ongoing radiation therapy -an unusual or allergic reaction to paclitaxel, alcohol, polyoxyethylated castor oil, other chemotherapy agents, other medicines, foods, dyes, or preservatives -pregnant or trying to get pregnant -breast-feeding How should I use this medicine? This drug is given as an infusion into a vein. It is administered in a hospital or clinic by a specially trained health care professional. Talk to your pediatrician regarding the use of this medicine in children. Special care may be needed. Overdosage: If you think you have taken too much of this medicine contact a poison control center or emergency room at once. NOTE: This medicine is only for you. Do not share this medicine with others. What if I miss a dose? It is important not to miss your dose. Call your doctor or health care professional if you are unable to keep an appointment. What may interact with this medicine? Do not take this medicine with any of the following medications: -disulfiram -metronidazole This medicine may also interact with the following medications: -cyclosporine -dexamethasone -diazepam -ketoconazole -medicines to increase blood counts like filgrastim, pegfilgrastim, sargramostim -other chemotherapy drugs like cisplatin, doxorubicin, epirubicin, etoposide,  teniposide, vincristine -quinidine -testosterone -vaccines -verapamil Talk to your doctor or health care professional before taking any of these medicines: -acetaminophen -aspirin -ibuprofen -ketoprofen -naproxen This list may not describe all possible interactions. Give your health care provider a list of all the medicines, herbs, non-prescription drugs, or dietary supplements you use. Also tell them if you smoke, drink alcohol, or use illegal drugs. Some items may interact with your medicine. What should I watch for while using this medicine? Your condition will be monitored carefully while you are receiving this medicine. You will need important blood work done while you are taking this medicine. This drug may make you feel generally unwell. This is not uncommon, as chemotherapy can affect healthy cells as well as cancer cells. Report any side effects. Continue your course of treatment even though you feel ill unless your doctor tells you to stop. In some cases, you may be given additional medicines to help with side effects. Follow all directions for their use. Call your doctor or health care professional for advice if you get a fever, chills or sore throat, or other symptoms of a cold or flu. Do not treat yourself. This drug decreases your body's ability to fight infections. Try to avoid being around people who are sick. This medicine may increase your risk to bruise or bleed. Call your doctor or health care professional if you notice any unusual bleeding. Be careful brushing and flossing your teeth or using a toothpick because you may get an infection or bleed more easily. If you have any dental work done, tell your dentist you are receiving this medicine. Avoid taking products that contain aspirin, acetaminophen, ibuprofen, naproxen, or ketoprofen unless instructed by your doctor. These medicines may hide a fever. Do not become pregnant   while taking this medicine. Women should inform their  doctor if they wish to become pregnant or think they might be pregnant. There is a potential for serious side effects to an unborn child. Talk to your health care professional or pharmacist for more information. Do not breast-feed an infant while taking this medicine. Men are advised not to father a child while receiving this medicine. What side effects may I notice from receiving this medicine? Side effects that you should report to your doctor or health care professional as soon as possible: -allergic reactions like skin rash, itching or hives, swelling of the face, lips, or tongue -low blood counts - This drug may decrease the number of white blood cells, red blood cells and platelets. You may be at increased risk for infections and bleeding. -signs of infection - fever or chills, cough, sore throat, pain or difficulty passing urine -signs of decreased platelets or bleeding - bruising, pinpoint red spots on the skin, black, tarry stools, nosebleeds -signs of decreased red blood cells - unusually weak or tired, fainting spells, lightheadedness -breathing problems -chest pain -high or low blood pressure -mouth sores -nausea and vomiting -pain, swelling, redness or irritation at the injection site -pain, tingling, numbness in the hands or feet -slow or irregular heartbeat -swelling of the ankle, feet, hands Side effects that usually do not require medical attention (report to your doctor or health care professional if they continue or are bothersome): -bone pain -complete hair loss including hair on your head, underarms, pubic hair, eyebrows, and eyelashes -changes in the color of fingernails -diarrhea -loosening of the fingernails -loss of appetite -muscle or joint pain -red flush to skin -sweating This list may not describe all possible side effects. Call your doctor for medical advice about side effects. You may report side effects to FDA at 1-800-FDA-1088. Where should I keep my  medicine? This drug is given in a hospital or clinic and will not be stored at home. NOTE: This sheet is a summary. It may not cover all possible information. If you have questions about this medicine, talk to your doctor, pharmacist, or health care provider.  2012, [employer]vier/[employer] Standard. (05/09/2008 11:54:26 AM) 

## 2012-04-13 NOTE — Progress Notes (Signed)
Diagnosis: Metastatic breast cancer.  Current therapy: #1 .  The patient is status post 3 cycles of Abraxane (3 weeks on/one-week off). #2 Zometa 4 mg IV every month.  Interim history: Anna Benjamin presents today for an office followup visit.  Overall, she, reports, that she continues to do quite well.  She has very minimal toxicity related to the chemotherapy.  She does have some pain in her upper back for which she takes hydrocodone for she, reports, that her energy level has improved.  Her appetite is excellent.  She does not have any nausea, vomiting, diarrhea, or constipation.  She denies any abdominal pain.  She denies any leg swelling.  She denies any headaches, visual changes, or rashes.  She denies any fevers, chills, or night sweats.  She denies any cough, chest pain, or shortness of breath.  Her last CA 27.29, was 739.  This really has come down nicely since a month ago it was 1083.  She states, that she's actually thought about going back to work except for waking up early in the morning, around 4:30 or 5.  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  Of note, she is requesting a prescription for Lunesta.  Apparently, she was placed on doxepin and her insurance does not cover this.   Review of Systems: Constitutional:Negative for malaise/fatigue, fever, chills, weight loss, diaphoresis, activity change, appetite change, and unexpected weight change.  HEENT: Negative for double vision, blurred vision, visual loss, ear pain, tinnitus, congestion, rhinorrhea, epistaxis sore throat or sinus disease, oral pain/lesion, tongue soreness Respiratory: Negative for cough, chest tightness, shortness of breath, wheezing and stridor.  Cardiovascular: Negative for chest pain, palpitations, leg swelling, orthopnea, PND, DOE or claudication Gastrointestinal: Negative for nausea, vomiting, abdominal pain, diarrhea, constipation, blood in stool, melena,  hematochezia, abdominal distention, anal bleeding, rectal pain, anorexia and hematemesis.  Genitourinary: Negative for dysuria, frequency, hematuria,  Musculoskeletal: Negative for myalgias, back pain, joint swelling, arthralgias and gait problem.  Skin: Negative for rash, color change, pallor and wound.  Neurological:. Negative for dizziness/light-headedness, tremors, seizures, syncope, facial asymmetry, speech difficulty, weakness, numbness, headaches and paresthesias.  Hematological: Negative for adenopathy. Does not bruise/bleed easily.  Psychiatric/Behavioral:  Negative for depression, no loss of interest in normal activity or change in sleep pattern.   Physical Exam: This is a very pleasant, 76 year old, white female, in no obvious distress Vitals: Temperature 97.6 degrees, pulse 84, respirations 18, blood pressure 137/63, weight 120 pounds HEENT reveals a normocephalic, atraumatic skull, no scleral icterus, no oral lesions  Neck is supple without any cervical or supraclavicular adenopathy.  Lungs are clear to auscultation bilaterally. There are no wheezes, rales or rhonci Cardiac is regular rate and rhythm with a normal S1 and S2. There are no murmurs, rubs, or bruits.  Abdomen is soft with good bowel sounds, there is no palpable mass. There is no palpable hepatosplenomegaly. There is no palpable fluid wave.  Musculoskeletal no tenderness of the spine, ribs, or hips.  Extremities there are no clubbing, cyanosis, or edema.  Skin no petechia, purpura or ecchymosis Neurologic is nonfocal.  Laboratory Data: White count 8.8, hemoglobin 12.6, hematocrit 30.4, platelets 191,000  Assessment/Plan: This is a pleasant, 76 year old, white female, with the following issues:  #1.  Metastatic breast cancer.  Again, by her CA 27.29.  She is responding quite nicely.  We will go ahead with her Abraxane and Zometa.  Today.  The plan is to rescan  her with a PET scan after her fourth cycle.  We will get  that set up.  #2 insomnia.  I will go ahead and give her prescription for Lunesta, which she had been on before.  #3 followup.  We will see her back in one month, at which time we will review the results of her PET scan.  We will see her before then should there be questions or concerns.

## 2012-04-13 NOTE — Addendum Note (Signed)
Addended by: Rosario Adie A on: 04/13/2012 04:12 PM   Modules accepted: Orders

## 2012-04-20 ENCOUNTER — Ambulatory Visit (HOSPITAL_BASED_OUTPATIENT_CLINIC_OR_DEPARTMENT_OTHER): Payer: Medicare Other

## 2012-04-20 ENCOUNTER — Other Ambulatory Visit (HOSPITAL_BASED_OUTPATIENT_CLINIC_OR_DEPARTMENT_OTHER): Payer: Medicare Other | Admitting: Lab

## 2012-04-20 VITALS — BP 116/63 | HR 74 | Temp 98.3°F | Resp 18

## 2012-04-20 DIAGNOSIS — C7952 Secondary malignant neoplasm of bone marrow: Secondary | ICD-10-CM

## 2012-04-20 DIAGNOSIS — C50919 Malignant neoplasm of unspecified site of unspecified female breast: Secondary | ICD-10-CM

## 2012-04-20 DIAGNOSIS — C50119 Malignant neoplasm of central portion of unspecified female breast: Secondary | ICD-10-CM

## 2012-04-20 DIAGNOSIS — C787 Secondary malignant neoplasm of liver and intrahepatic bile duct: Secondary | ICD-10-CM

## 2012-04-20 DIAGNOSIS — Z5111 Encounter for antineoplastic chemotherapy: Secondary | ICD-10-CM

## 2012-04-20 LAB — CBC WITH DIFFERENTIAL (CANCER CENTER ONLY)
BASO#: 0 10*3/uL (ref 0.0–0.2)
Eosinophils Absolute: 0.1 10*3/uL (ref 0.0–0.5)
HCT: 36.7 % (ref 34.8–46.6)
HGB: 12.1 g/dL (ref 11.6–15.9)
LYMPH%: 43.1 % (ref 14.0–48.0)
MCH: 32.9 pg (ref 26.0–34.0)
MCV: 100 fL (ref 81–101)
MONO#: 0.5 10*3/uL (ref 0.1–0.9)
MONO%: 6 % (ref 0.0–13.0)
Platelets: 224 10*3/uL (ref 145–400)
RBC: 3.68 10*6/uL — ABNORMAL LOW (ref 3.70–5.32)
WBC: 8.5 10*3/uL (ref 3.9–10.0)

## 2012-04-20 LAB — TECHNOLOGIST REVIEW CHCC SATELLITE

## 2012-04-20 MED ORDER — SODIUM CHLORIDE 0.9 % IV SOLN
Freq: Once | INTRAVENOUS | Status: AC
  Administered 2012-04-20: 11:00:00 via INTRAVENOUS

## 2012-04-20 MED ORDER — PACLITAXEL PROTEIN-BOUND CHEMO INJECTION 100 MG
100.0000 mg/m2 | Freq: Once | INTRAVENOUS | Status: AC
  Administered 2012-04-20: 150 mg via INTRAVENOUS
  Filled 2012-04-20: qty 30

## 2012-04-20 MED ORDER — DEXAMETHASONE SODIUM PHOSPHATE 10 MG/ML IJ SOLN
10.0000 mg | Freq: Once | INTRAMUSCULAR | Status: AC
  Administered 2012-04-20: 10 mg via INTRAVENOUS

## 2012-04-20 MED ORDER — ONDANSETRON 8 MG/50ML IVPB (CHCC)
8.0000 mg | Freq: Once | INTRAVENOUS | Status: AC
  Administered 2012-04-20: 8 mg via INTRAVENOUS

## 2012-04-20 MED ORDER — SODIUM CHLORIDE 0.9 % IJ SOLN
10.0000 mL | INTRAMUSCULAR | Status: DC | PRN
  Filled 2012-04-20: qty 10

## 2012-04-20 MED ORDER — HEPARIN SOD (PORK) LOCK FLUSH 100 UNIT/ML IV SOLN
500.0000 [IU] | Freq: Once | INTRAVENOUS | Status: DC | PRN
  Filled 2012-04-20: qty 5

## 2012-04-20 NOTE — Patient Instructions (Signed)
Paclitaxel injection What is this medicine? PACLITAXEL (PAK li TAX el) is a chemotherapy drug. It targets fast dividing cells, like cancer cells, and causes these cells to die. This medicine is used to treat ovarian cancer, breast cancer, and other cancers. This medicine may be used for other purposes; ask your health care provider or pharmacist if you have questions. What should I tell my health care provider before I take this medicine? They need to know if you have any of these conditions: -blood disorders -irregular heartbeat -infection (especially a virus infection such as chickenpox, cold sores, or herpes) -liver disease -previous or ongoing radiation therapy -an unusual or allergic reaction to paclitaxel, alcohol, polyoxyethylated castor oil, other chemotherapy agents, other medicines, foods, dyes, or preservatives -pregnant or trying to get pregnant -breast-feeding How should I use this medicine? This drug is given as an infusion into a vein. It is administered in a hospital or clinic by a specially trained health care professional. Talk to your pediatrician regarding the use of this medicine in children. Special care may be needed. Overdosage: If you think you have taken too much of this medicine contact a poison control center or emergency room at once. NOTE: This medicine is only for you. Do not share this medicine with others. What if I miss a dose? It is important not to miss your dose. Call your doctor or health care professional if you are unable to keep an appointment. What may interact with this medicine? Do not take this medicine with any of the following medications: -disulfiram -metronidazole This medicine may also interact with the following medications: -cyclosporine -dexamethasone -diazepam -ketoconazole -medicines to increase blood counts like filgrastim, pegfilgrastim, sargramostim -other chemotherapy drugs like cisplatin, doxorubicin, epirubicin, etoposide,  teniposide, vincristine -quinidine -testosterone -vaccines -verapamil Talk to your doctor or health care professional before taking any of these medicines: -acetaminophen -aspirin -ibuprofen -ketoprofen -naproxen This list may not describe all possible interactions. Give your health care provider a list of all the medicines, herbs, non-prescription drugs, or dietary supplements you use. Also tell them if you smoke, drink alcohol, or use illegal drugs. Some items may interact with your medicine. What should I watch for while using this medicine? Your condition will be monitored carefully while you are receiving this medicine. You will need important blood work done while you are taking this medicine. This drug may make you feel generally unwell. This is not uncommon, as chemotherapy can affect healthy cells as well as cancer cells. Report any side effects. Continue your course of treatment even though you feel ill unless your doctor tells you to stop. In some cases, you may be given additional medicines to help with side effects. Follow all directions for their use. Call your doctor or health care professional for advice if you get a fever, chills or sore throat, or other symptoms of a cold or flu. Do not treat yourself. This drug decreases your body's ability to fight infections. Try to avoid being around people who are sick. This medicine may increase your risk to bruise or bleed. Call your doctor or health care professional if you notice any unusual bleeding. Be careful brushing and flossing your teeth or using a toothpick because you may get an infection or bleed more easily. If you have any dental work done, tell your dentist you are receiving this medicine. Avoid taking products that contain aspirin, acetaminophen, ibuprofen, naproxen, or ketoprofen unless instructed by your doctor. These medicines may hide a fever. Do not become pregnant   while taking this medicine. Women should inform their  doctor if they wish to become pregnant or think they might be pregnant. There is a potential for serious side effects to an unborn child. Talk to your health care professional or pharmacist for more information. Do not breast-feed an infant while taking this medicine. Men are advised not to father a child while receiving this medicine. What side effects may I notice from receiving this medicine? Side effects that you should report to your doctor or health care professional as soon as possible: -allergic reactions like skin rash, itching or hives, swelling of the face, lips, or tongue -low blood counts - This drug may decrease the number of white blood cells, red blood cells and platelets. You may be at increased risk for infections and bleeding. -signs of infection - fever or chills, cough, sore throat, pain or difficulty passing urine -signs of decreased platelets or bleeding - bruising, pinpoint red spots on the skin, black, tarry stools, nosebleeds -signs of decreased red blood cells - unusually weak or tired, fainting spells, lightheadedness -breathing problems -chest pain -high or low blood pressure -mouth sores -nausea and vomiting -pain, swelling, redness or irritation at the injection site -pain, tingling, numbness in the hands or feet -slow or irregular heartbeat -swelling of the ankle, feet, hands Side effects that usually do not require medical attention (report to your doctor or health care professional if they continue or are bothersome): -bone pain -complete hair loss including hair on your head, underarms, pubic hair, eyebrows, and eyelashes -changes in the color of fingernails -diarrhea -loosening of the fingernails -loss of appetite -muscle or joint pain -red flush to skin -sweating This list may not describe all possible side effects. Call your doctor for medical advice about side effects. You may report side effects to FDA at 1-800-FDA-1088. Where should I keep my  medicine? This drug is given in a hospital or clinic and will not be stored at home. NOTE: This sheet is a summary. It may not cover all possible information. If you have questions about this medicine, talk to your doctor, pharmacist, or health care provider.  2012, [employer]vier/[employer] Standard. (05/09/2008 11:54:26 AM) 

## 2012-04-27 ENCOUNTER — Ambulatory Visit (HOSPITAL_BASED_OUTPATIENT_CLINIC_OR_DEPARTMENT_OTHER): Payer: Medicare Other

## 2012-04-27 ENCOUNTER — Other Ambulatory Visit (HOSPITAL_BASED_OUTPATIENT_CLINIC_OR_DEPARTMENT_OTHER): Payer: Medicare Other | Admitting: Lab

## 2012-04-27 VITALS — BP 111/65 | HR 71 | Temp 97.4°F | Resp 18

## 2012-04-27 DIAGNOSIS — C50119 Malignant neoplasm of central portion of unspecified female breast: Secondary | ICD-10-CM

## 2012-04-27 DIAGNOSIS — C787 Secondary malignant neoplasm of liver and intrahepatic bile duct: Secondary | ICD-10-CM

## 2012-04-27 DIAGNOSIS — C50919 Malignant neoplasm of unspecified site of unspecified female breast: Secondary | ICD-10-CM

## 2012-04-27 DIAGNOSIS — C7951 Secondary malignant neoplasm of bone: Secondary | ICD-10-CM

## 2012-04-27 DIAGNOSIS — C7952 Secondary malignant neoplasm of bone marrow: Secondary | ICD-10-CM

## 2012-04-27 DIAGNOSIS — Z5111 Encounter for antineoplastic chemotherapy: Secondary | ICD-10-CM

## 2012-04-27 LAB — CBC WITH DIFFERENTIAL (CANCER CENTER ONLY)
BASO#: 0 10*3/uL (ref 0.0–0.2)
EOS%: 0.6 % (ref 0.0–7.0)
HCT: 35.3 % (ref 34.8–46.6)
HGB: 11.6 g/dL (ref 11.6–15.9)
LYMPH#: 3.9 10*3/uL — ABNORMAL HIGH (ref 0.9–3.3)
LYMPH%: 53 % — ABNORMAL HIGH (ref 14.0–48.0)
MCHC: 32.9 g/dL (ref 32.0–36.0)
MCV: 99 fL (ref 81–101)
MONO#: 0.4 10*3/uL (ref 0.1–0.9)
NEUT%: 39.9 % (ref 39.6–80.0)

## 2012-04-27 MED ORDER — ONDANSETRON 8 MG/50ML IVPB (CHCC)
8.0000 mg | Freq: Once | INTRAVENOUS | Status: AC
  Administered 2012-04-27: 8 mg via INTRAVENOUS

## 2012-04-27 MED ORDER — DEXAMETHASONE SODIUM PHOSPHATE 10 MG/ML IJ SOLN
10.0000 mg | Freq: Once | INTRAMUSCULAR | Status: AC
  Administered 2012-04-27: 10 mg via INTRAVENOUS

## 2012-04-27 MED ORDER — PACLITAXEL PROTEIN-BOUND CHEMO INJECTION 100 MG
100.0000 mg/m2 | Freq: Once | INTRAVENOUS | Status: AC
  Administered 2012-04-27: 150 mg via INTRAVENOUS
  Filled 2012-04-27: qty 30

## 2012-04-27 MED ORDER — SODIUM CHLORIDE 0.9 % IV SOLN
Freq: Once | INTRAVENOUS | Status: AC
  Administered 2012-04-27: 12:00:00 via INTRAVENOUS

## 2012-04-27 NOTE — Patient Instructions (Addendum)
Paclitaxel injection What is this medicine? PACLITAXEL (PAK li TAX el) is a chemotherapy drug. It targets fast dividing cells, like cancer cells, and causes these cells to die. This medicine is used to treat ovarian cancer, breast cancer, and other cancers. This medicine may be used for other purposes; ask your health care provider or pharmacist if you have questions. What should I tell my health care provider before I take this medicine? They need to know if you have any of these conditions: -blood disorders -irregular heartbeat -infection (especially a virus infection such as chickenpox, cold sores, or herpes) -liver disease -previous or ongoing radiation therapy -an unusual or allergic reaction to paclitaxel, alcohol, polyoxyethylated castor oil, other chemotherapy agents, other medicines, foods, dyes, or preservatives -pregnant or trying to get pregnant -breast-feeding How should I use this medicine? This drug is given as an infusion into a vein. It is administered in a hospital or clinic by a specially trained health care professional. Talk to your pediatrician regarding the use of this medicine in children. Special care may be needed. Overdosage: If you think you have taken too much of this medicine contact a poison control center or emergency room at once. NOTE: This medicine is only for you. Do not share this medicine with others. What if I miss a dose? It is important not to miss your dose. Call your doctor or health care professional if you are unable to keep an appointment. What may interact with this medicine? Do not take this medicine with any of the following medications: -disulfiram -metronidazole This medicine may also interact with the following medications: -cyclosporine -dexamethasone -diazepam -ketoconazole -medicines to increase blood counts like filgrastim, pegfilgrastim, sargramostim -other chemotherapy drugs like cisplatin, doxorubicin, epirubicin, etoposide,  teniposide, vincristine -quinidine -testosterone -vaccines -verapamil Talk to your doctor or health care professional before taking any of these medicines: -acetaminophen -aspirin -ibuprofen -ketoprofen -naproxen This list may not describe all possible interactions. Give your health care provider a list of all the medicines, herbs, non-prescription drugs, or dietary supplements you use. Also tell them if you smoke, drink alcohol, or use illegal drugs. Some items may interact with your medicine. What should I watch for while using this medicine? Your condition will be monitored carefully while you are receiving this medicine. You will need important blood work done while you are taking this medicine. This drug may make you feel generally unwell. This is not uncommon, as chemotherapy can affect healthy cells as well as cancer cells. Report any side effects. Continue your course of treatment even though you feel ill unless your doctor tells you to stop. In some cases, you may be given additional medicines to help with side effects. Follow all directions for their use. Call your doctor or health care professional for advice if you get a fever, chills or sore throat, or other symptoms of a cold or flu. Do not treat yourself. This drug decreases your body's ability to fight infections. Try to avoid being around people who are sick. This medicine may increase your risk to bruise or bleed. Call your doctor or health care professional if you notice any unusual bleeding. Be careful brushing and flossing your teeth or using a toothpick because you may get an infection or bleed more easily. If you have any dental work done, tell your dentist you are receiving this medicine. Avoid taking products that contain aspirin, acetaminophen, ibuprofen, naproxen, or ketoprofen unless instructed by your doctor. These medicines may hide a fever. Do not become pregnant   while taking this medicine. Women should inform their  doctor if they wish to become pregnant or think they might be pregnant. There is a potential for serious side effects to an unborn child. Talk to your health care professional or pharmacist for more information. Do not breast-feed an infant while taking this medicine. Men are advised not to father a child while receiving this medicine. What side effects may I notice from receiving this medicine? Side effects that you should report to your doctor or health care professional as soon as possible: -allergic reactions like skin rash, itching or hives, swelling of the face, lips, or tongue -low blood counts - This drug may decrease the number of white blood cells, red blood cells and platelets. You may be at increased risk for infections and bleeding. -signs of infection - fever or chills, cough, sore throat, pain or difficulty passing urine -signs of decreased platelets or bleeding - bruising, pinpoint red spots on the skin, black, tarry stools, nosebleeds -signs of decreased red blood cells - unusually weak or tired, fainting spells, lightheadedness -breathing problems -chest pain -high or low blood pressure -mouth sores -nausea and vomiting -pain, swelling, redness or irritation at the injection site -pain, tingling, numbness in the hands or feet -slow or irregular heartbeat -swelling of the ankle, feet, hands Side effects that usually do not require medical attention (report to your doctor or health care professional if they continue or are bothersome): -bone pain -complete hair loss including hair on your head, underarms, pubic hair, eyebrows, and eyelashes -changes in the color of fingernails -diarrhea -loosening of the fingernails -loss of appetite -muscle or joint pain -red flush to skin -sweating This list may not describe all possible side effects. Call your doctor for medical advice about side effects. You may report side effects to FDA at 1-800-FDA-1088. Where should I keep my  medicine? This drug is given in a hospital or clinic and will not be stored at home. NOTE: This sheet is a summary. It may not cover all possible information. If you have questions about this medicine, talk to your doctor, pharmacist, or health care provider.  2012, [employer]vier/[employer] Standard. (05/09/2008 11:54:26 AM) 

## 2012-04-29 ENCOUNTER — Encounter (HOSPITAL_BASED_OUTPATIENT_CLINIC_OR_DEPARTMENT_OTHER)
Admission: RE | Admit: 2012-04-29 | Discharge: 2012-04-29 | Disposition: A | Discharge status: Home / Self Care | Payer: Medicare Other | Source: Ambulatory Visit | Attending: Medical | Admitting: Medical

## 2012-04-29 DIAGNOSIS — C50919 Malignant neoplasm of unspecified site of unspecified female breast: Secondary | ICD-10-CM

## 2012-04-29 DIAGNOSIS — C7951 Secondary malignant neoplasm of bone: Secondary | ICD-10-CM | POA: Insufficient documentation

## 2012-04-29 DIAGNOSIS — N200 Calculus of kidney: Secondary | ICD-10-CM | POA: Insufficient documentation

## 2012-04-29 DIAGNOSIS — J984 Other disorders of lung: Secondary | ICD-10-CM | POA: Insufficient documentation

## 2012-04-29 DIAGNOSIS — C787 Secondary malignant neoplasm of liver and intrahepatic bile duct: Secondary | ICD-10-CM | POA: Insufficient documentation

## 2012-04-29 MED ORDER — FLUDEOXYGLUCOSE F - 18 (FDG) INJECTION
14.7900 | Freq: Once | INTRAVENOUS | Status: AC | PRN
  Administered 2012-04-29: 14.79 via INTRAVENOUS

## 2012-05-06 ENCOUNTER — Other Ambulatory Visit (HOSPITAL_COMMUNITY): Payer: Medicare Other

## 2012-05-11 ENCOUNTER — Other Ambulatory Visit (HOSPITAL_BASED_OUTPATIENT_CLINIC_OR_DEPARTMENT_OTHER): Payer: Medicare Other | Admitting: Lab

## 2012-05-11 ENCOUNTER — Ambulatory Visit (HOSPITAL_BASED_OUTPATIENT_CLINIC_OR_DEPARTMENT_OTHER): Payer: Medicare Other | Admitting: Hematology & Oncology

## 2012-05-11 VITALS — BP 118/71 | HR 88 | Temp 97.9°F | Resp 16 | Ht 59.0 in | Wt 118.0 lb

## 2012-05-11 DIAGNOSIS — C50119 Malignant neoplasm of central portion of unspecified female breast: Secondary | ICD-10-CM

## 2012-05-11 DIAGNOSIS — C7951 Secondary malignant neoplasm of bone: Secondary | ICD-10-CM

## 2012-05-11 DIAGNOSIS — C50919 Malignant neoplasm of unspecified site of unspecified female breast: Secondary | ICD-10-CM

## 2012-05-11 DIAGNOSIS — C787 Secondary malignant neoplasm of liver and intrahepatic bile duct: Secondary | ICD-10-CM

## 2012-05-11 DIAGNOSIS — C7952 Secondary malignant neoplasm of bone marrow: Secondary | ICD-10-CM

## 2012-05-11 LAB — COMPREHENSIVE METABOLIC PANEL
Alkaline Phosphatase: 61 U/L (ref 39–117)
BUN: 17 mg/dL (ref 6–23)
CO2: 25 mEq/L (ref 19–32)
Glucose, Bld: 102 mg/dL — ABNORMAL HIGH (ref 70–99)
Total Bilirubin: 0.4 mg/dL (ref 0.3–1.2)

## 2012-05-11 LAB — CANCER ANTIGEN 27.29: CA 27.29: 533 U/mL — ABNORMAL HIGH (ref 0–39)

## 2012-05-11 LAB — CBC WITH DIFFERENTIAL (CANCER CENTER ONLY)
BASO#: 0 10*3/uL (ref 0.0–0.2)
Eosinophils Absolute: 0 10*3/uL (ref 0.0–0.5)
HCT: 38.1 % (ref 34.8–46.6)
HGB: 12.6 g/dL (ref 11.6–15.9)
LYMPH%: 34.9 % (ref 14.0–48.0)
MCH: 32.7 pg (ref 26.0–34.0)
MCV: 99 fL (ref 81–101)
MONO%: 7.6 % (ref 0.0–13.0)
NEUT%: 56.9 % (ref 39.6–80.0)
RBC: 3.85 10*6/uL (ref 3.70–5.32)

## 2012-05-11 NOTE — Progress Notes (Signed)
This office note has been dictated.

## 2012-05-13 ENCOUNTER — Other Ambulatory Visit (HOSPITAL_BASED_OUTPATIENT_CLINIC_OR_DEPARTMENT_OTHER): Payer: Medicare Other

## 2012-05-13 NOTE — Progress Notes (Signed)
CC:   Lelon Perla, DO  DIAGNOSIS:  Metastatic breast cancer.  CURRENT THERAPY: 1. Status post 3 cycles of Abraxane (3 weeks on/1 week off). 2. Zometa 4 mg IV monthly.  INTERIM HISTORY:  Mr Berrong comes in for her followup.  She has done incredibly well with treatment.  She has tolerated it very nicely.  She is very functional.  She had a good Thanksgiving.  We did go ahead and do a PET scan on her.  This was for followup.  The PET scan showed a very nice response with decrease in her lesions in size and also in hypermetabolic activity.  No new lesions were noted anywhere.  Her CA 27.29 keeps coming down; today, it was 533.  The patient has no abdominal pain.  There is no cough.  Her overall pain seems to be doing better.  She has had no leg swelling.  She has had no dysphagia or odynophagia.  There have been no mouth sores.  PHYSICAL EXAMINATION:  This is a petite white female in no obvious distress.  Vital Signs:  Temperature of 97.9, pulse 88, respiratory rate 16, blood pressure 118/71.  Weight is 118.  Head and Neck: Normocephalic, atraumatic skull.  There are no ocular or oral lesions. There are no palpable cervical or supraclavicular lymph nodes.  Lungs: Clear bilaterally.  Cardiac:  Regular rate and rhythm with a normal S1, S2.  There are no murmurs, rubs, or bruits.  Abdomen:  Soft with good bowel sounds.  There is no palpable abdominal mass.  No palpable hepatosplenomegaly.  Extremities:  No clubbing, cyanosis, or edema. Neurologic:  No focal neurological deficits.  Skin:  No rashes, ecchymosis, or petechia.  LABORATORY STUDIES:  White cell count is 7.7, hemoglobin 12.6, hematocrit 38.1, platelet count 199.  Calcium is 9.4, with an with an albumin of 4.4.  LFTs are normal.  CA 27.29 is 533.  IMPRESSION:  Anna Benjamin is a very nice 76 year old white female with metastatic breast cancer.  She is responding nicely.  Her CA 27.29 keeps coming down.  She also has  improvement of her PET scan.  We will go ahead and plan to start her on treatment next week.  Again, I think that we can give her an extra week off, just so she can enjoy herself and her family.  We will plan to get her back in early January for her, I think, 5th cycle of treatment.    ______________________________ Josph Macho, M.D. PRE/MEDQ  D:  05/12/2012  T:  05/13/2012  Job:  [id number]

## 2012-05-18 ENCOUNTER — Ambulatory Visit (HOSPITAL_BASED_OUTPATIENT_CLINIC_OR_DEPARTMENT_OTHER): Payer: Medicare Other

## 2012-05-18 ENCOUNTER — Other Ambulatory Visit (HOSPITAL_BASED_OUTPATIENT_CLINIC_OR_DEPARTMENT_OTHER): Payer: Medicare Other | Admitting: Lab

## 2012-05-18 VITALS — BP 139/81 | HR 75 | Temp 96.8°F | Resp 18

## 2012-05-18 DIAGNOSIS — C787 Secondary malignant neoplasm of liver and intrahepatic bile duct: Secondary | ICD-10-CM

## 2012-05-18 DIAGNOSIS — C50119 Malignant neoplasm of central portion of unspecified female breast: Secondary | ICD-10-CM

## 2012-05-18 DIAGNOSIS — M810 Age-related osteoporosis without current pathological fracture: Secondary | ICD-10-CM

## 2012-05-18 DIAGNOSIS — C50919 Malignant neoplasm of unspecified site of unspecified female breast: Secondary | ICD-10-CM

## 2012-05-18 DIAGNOSIS — Z5111 Encounter for antineoplastic chemotherapy: Secondary | ICD-10-CM

## 2012-05-18 DIAGNOSIS — C7952 Secondary malignant neoplasm of bone marrow: Secondary | ICD-10-CM

## 2012-05-18 LAB — CMP (CANCER CENTER ONLY)
ALT(SGPT): 19 U/L (ref 10–47)
BUN, Bld: 15 mg/dL (ref 7–22)
CO2: 32 mEq/L (ref 18–33)
Calcium: 8.9 mg/dL (ref 8.0–10.3)
Chloride: 103 mEq/L (ref 98–108)
Creat: 0.6 mg/dl (ref 0.6–1.2)

## 2012-05-18 LAB — CBC WITH DIFFERENTIAL (CANCER CENTER ONLY)
Eosinophils Absolute: 0.1 10*3/uL (ref 0.0–0.5)
HCT: 37.9 % (ref 34.8–46.6)
HGB: 12.8 g/dL (ref 11.6–15.9)
LYMPH%: 35.9 % (ref 14.0–48.0)
MCV: 98 fL (ref 81–101)
MONO#: 0.9 10*3/uL (ref 0.1–0.9)
NEUT%: 53.9 % (ref 39.6–80.0)
RBC: 3.86 10*6/uL (ref 3.70–5.32)
WBC: 10.7 10*3/uL — ABNORMAL HIGH (ref 3.9–10.0)

## 2012-05-18 LAB — CANCER ANTIGEN 27.29: CA 27.29: 390 U/mL — ABNORMAL HIGH (ref 0–39)

## 2012-05-18 MED ORDER — DEXAMETHASONE SODIUM PHOSPHATE 10 MG/ML IJ SOLN
10.0000 mg | Freq: Once | INTRAMUSCULAR | Status: AC
  Administered 2012-05-18: 10 mg via INTRAVENOUS

## 2012-05-18 MED ORDER — SODIUM CHLORIDE 0.9 % IV SOLN
Freq: Once | INTRAVENOUS | Status: AC
  Administered 2012-05-18: 13:00:00 via INTRAVENOUS

## 2012-05-18 MED ORDER — ZOLEDRONIC ACID 4 MG/5ML IV CONC
4.0000 mg | Freq: Once | INTRAVENOUS | Status: AC
  Administered 2012-05-18: 4 mg via INTRAVENOUS
  Filled 2012-05-18: qty 5

## 2012-05-18 MED ORDER — PACLITAXEL PROTEIN-BOUND CHEMO INJECTION 100 MG
100.0000 mg/m2 | Freq: Once | INTRAVENOUS | Status: AC
  Administered 2012-05-18: 150 mg via INTRAVENOUS
  Filled 2012-05-18: qty 30

## 2012-05-18 MED ORDER — ONDANSETRON 8 MG/50ML IVPB (CHCC)
8.0000 mg | Freq: Once | INTRAVENOUS | Status: AC
  Administered 2012-05-18: 8 mg via INTRAVENOUS

## 2012-05-18 NOTE — Patient Instructions (Addendum)
Zoledronic Acid injection (Hypercalcemia, Oncology) What is this medicine? ZOLEDRONIC ACID (ZOE le dron ik AS id) lowers the amount of calcium loss from bone. It is used to treat too much calcium in your blood from cancer. It is also used to prevent complications of cancer that has spread to the bone. This medicine may be used for other purposes; ask your health care provider or pharmacist if you have questions. What should I tell my health care provider before I take this medicine? They need to know if you have any of these conditions: -aspirin-sensitive asthma -dental disease -kidney disease -an unusual or allergic reaction to zoledronic acid, other medicines, foods, dyes, or preservatives -pregnant or trying to get pregnant -breast-feeding How should I use this medicine? This medicine is for infusion into a vein. It is given by a health care professional in a hospital or clinic setting. Talk to your pediatrician regarding the use of this medicine in children. Special care may be needed. Overdosage: If you think you have taken too much of this medicine contact a poison control center or emergency room at once. NOTE: This medicine is only for you. Do not share this medicine with others. What if I miss a dose? It is important not to miss your dose. Call your doctor or health care professional if you are unable to keep an appointment. What may interact with this medicine? -certain antibiotics given by injection -NSAIDs, medicines for pain and inflammation, like ibuprofen or naproxen -some diuretics like bumetanide, furosemide -teriparatide -thalidomide This list may not describe all possible interactions. Give your health care provider a list of all the medicines, herbs, non-prescription drugs, or dietary supplements you use. Also tell them if you smoke, drink alcohol, or use illegal drugs. Some items may interact with your medicine. What should I watch for while using this medicine? Visit  your doctor or health care professional for regular checkups. It may be some time before you see the benefit from this medicine. Do not stop taking your medicine unless your doctor tells you to. Your doctor may order blood tests or other tests to see how you are doing. Women should inform their doctor if they wish to become pregnant or think they might be pregnant. There is a potential for serious side effects to an unborn child. Talk to your health care professional or pharmacist for more information. You should make sure that you get enough calcium and vitamin D while you are taking this medicine. Discuss the foods you eat and the vitamins you take with your health care professional. Some people who take this medicine have severe bone, joint, and/or muscle pain. This medicine may also increase your risk for a broken thigh bone. Tell your doctor right away if you have pain in your upper leg or groin. Tell your doctor if you have any pain that does not go away or that gets worse. What side effects may I notice from receiving this medicine? Side effects that you should report to your doctor or health care professional as soon as possible: -allergic reactions like skin rash, itching or hives, swelling of the face, lips, or tongue -anxiety, confusion, or depression -breathing problems -changes in vision -feeling faint or lightheaded, falls -jaw burning, cramping, pain -muscle cramps, stiffness, or weakness -trouble passing urine or change in the amount of urine Side effects that usually do not require medical attention (report to your doctor or health care professional if they continue or are bothersome): -bone, joint, or muscle pain -  fever -hair loss -irritation at site where injected -loss of appetite -nausea, vomiting -stomach upset -tired This list may not describe all possible side effects. Call your doctor for medical advice about side effects. You may report side effects to FDA at  1-800-FDA-1088. Where should I keep my medicine? This drug is given in a hospital or clinic and will not be stored at home. NOTE: This sheet is a summary. It may not cover all possible information. If you have questions about this medicine, talk to your doctor, pharmacist, or health care provider.  2012, [employer]. (11/23/2010 9:06:58 AM)Nanoparticle Albumin-Bound Paclitaxel injection What is this medicine? NANOPARTICLE ALBUMIN-BOUND PACLITAXEL (Na no PAHR ti kuhl al BYOO muhn-bound PAK li TAX el) is a chemotherapy drug. It targets fast dividing cells, like cancer cells, and causes these cells to die. This medicine is used to treat advanced breast cancer and advanced lung cancer. This medicine may be used for other purposes; ask your health care provider or pharmacist if you have questions. What should I tell my health care provider before I take this medicine? They need to know if you have any of these conditions: -kidney disease -liver disease -low blood counts, like low platelets, red blood cells, or white blood cells -recent or ongoing radiation therapy -an unusual or allergic reaction to paclitaxel, albumin, other chemotherapy, other medicines, foods, dyes, or preservatives -pregnant or trying to get pregnant -breast-feeding How should I use this medicine? This drug is given as an infusion into a vein. It is administered in a hospital or clinic by a specially trained health care professional. Talk to your pediatrician regarding the use of this medicine in children. Special care may be needed. Overdosage: If you think you have taken too much of this medicine contact a poison control center or emergency room at once. NOTE: This medicine is only for you. Do not share this medicine with others. What if I miss a dose? It is important not to miss your dose. Call your doctor or health care professional if you are unable to keep an appointment. What may interact with this  medicine? This medicine may also interact with the following medications: -cyclosporine -dexamethasone -diazepam -ketoconazole -medicines to increase blood counts like filgrastim, pegfilgrastim, sargramostim -other chemotherapy drugs like cisplatin, doxorubicin, epirubicin, etoposide, teniposide, vincristine -quinidine -testosterone -vaccines -verapamil Talk to your doctor or health care professional before taking any of these medicines: -acetaminophen -aspirin -ibuprofen -ketoprofen -naproxen This list may not describe all possible interactions. Give your health care provider a list of all the medicines, herbs, non-prescription drugs, or dietary supplements you use. Also tell them if you smoke, drink alcohol, or use illegal drugs. Some items may interact with your medicine. What should I watch for while using this medicine? Your condition will be monitored carefully while you are receiving this medicine. You will need important blood work done while you are taking this medicine. This drug may make you feel generally unwell. This is not uncommon, as chemotherapy can affect healthy cells as well as cancer cells. Report any side effects. Continue your course of treatment even though you feel ill unless your doctor tells you to stop. In some cases, you may be given additional medicines to help with side effects. Follow all directions for their use. Call your doctor or health care professional for advice if you get a fever, chills or sore throat, or other symptoms of a cold or flu. Do not treat yourself. This drug decreases your body's ability to fight  infections. Try to avoid being around people who are sick. This medicine may increase your risk to bruise or bleed. Call your doctor or health care professional if you notice any unusual bleeding. Be careful brushing and flossing your teeth or using a toothpick because you may get an infection or bleed more easily. If you have any dental work  done, tell your dentist you are receiving this medicine. Avoid taking products that contain aspirin, acetaminophen, ibuprofen, naproxen, or ketoprofen unless instructed by your doctor. These medicines may hide a fever. Do not become pregnant while taking this medicine. Women should inform their doctor if they wish to become pregnant or think they might be pregnant. There is a potential for serious side effects to an unborn child. Talk to your health care professional or pharmacist for more information. Do not breast-feed an infant while taking this medicine. Men are advised not to father a child while receiving this medicine. What side effects may I notice from receiving this medicine? Side effects that you should report to your doctor or health care professional as soon as possible: -allergic reactions like skin rash, itching or hives, swelling of the face, lips, or tongue -low blood counts - This drug may decrease the number of white blood cells, red blood cells and platelets. You may be at increased risk for infections and bleeding. -signs of infection - fever or chills, cough, sore throat, pain or difficulty passing urine -signs of decreased platelets or bleeding - bruising, pinpoint red spots on the skin, black, tarry stools, nosebleeds -signs of decreased red blood cells - unusually weak or tired, fainting spells, lightheadedness -breathing problems -changes in vision -chest pain -high or low blood pressure -mouth sores -nausea and vomiting -pain, swelling, redness or irritation at the injection site -pain, tingling, numbness in the hands or feet -slow or irregular heartbeat -swelling of the ankle, feet, hands Side effects that usually do not require medical attention (report to your doctor or health care professional if they continue or are bothersome): -aches, pains -changes in the color of fingernails -diarrhea -hair loss -loss of appetite This list may not describe all possible  side effects. Call your doctor for medical advice about side effects. You may report side effects to FDA at 1-800-FDA-1088. Where should I keep my medicine? This drug is given in a hospital or clinic and will not be stored at home. NOTE: This sheet is a summary. It may not cover all possible information. If you have questions about this medicine, talk to your doctor, pharmacist, or health care provider.  2013, [employer]. (04/01/2011 4:13:49 PM)

## 2012-05-25 ENCOUNTER — Other Ambulatory Visit (HOSPITAL_BASED_OUTPATIENT_CLINIC_OR_DEPARTMENT_OTHER): Payer: Medicare Other | Admitting: Lab

## 2012-05-25 ENCOUNTER — Ambulatory Visit (HOSPITAL_BASED_OUTPATIENT_CLINIC_OR_DEPARTMENT_OTHER): Payer: Medicare Other

## 2012-05-25 VITALS — BP 117/70 | HR 77 | Temp 96.6°F | Resp 18

## 2012-05-25 DIAGNOSIS — C50919 Malignant neoplasm of unspecified site of unspecified female breast: Secondary | ICD-10-CM

## 2012-05-25 DIAGNOSIS — Z5111 Encounter for antineoplastic chemotherapy: Secondary | ICD-10-CM

## 2012-05-25 DIAGNOSIS — C50119 Malignant neoplasm of central portion of unspecified female breast: Secondary | ICD-10-CM

## 2012-05-25 DIAGNOSIS — C787 Secondary malignant neoplasm of liver and intrahepatic bile duct: Secondary | ICD-10-CM

## 2012-05-25 DIAGNOSIS — C7951 Secondary malignant neoplasm of bone: Secondary | ICD-10-CM

## 2012-05-25 LAB — CBC WITH DIFFERENTIAL (CANCER CENTER ONLY)
BASO#: 0 10e3/uL (ref 0.0–0.2)
BASO%: 0.2 % (ref 0.0–2.0)
EOS%: 1.3 % (ref 0.0–7.0)
Eosinophils Absolute: 0.1 10e3/uL (ref 0.0–0.5)
HCT: 37.3 % (ref 34.8–46.6)
HGB: 12.3 g/dL (ref 11.6–15.9)
LYMPH#: 4.2 10e3/uL — ABNORMAL HIGH (ref 0.9–3.3)
LYMPH%: 45.5 % (ref 14.0–48.0)
MCH: 32.7 pg (ref 26.0–34.0)
MCHC: 33 g/dL (ref 32.0–36.0)
MCV: 99 fL (ref 81–101)
MONO#: 0.5 10e3/uL (ref 0.1–0.9)
MONO%: 5.4 % (ref 0.0–13.0)
NEUT#: 4.4 10e3/uL (ref 1.5–6.5)
NEUT%: 47.6 % (ref 39.6–80.0)
Platelets: 204 10e3/uL (ref 145–400)
RBC: 3.76 10e6/uL (ref 3.70–5.32)
RDW: 12.6 % (ref 11.1–15.7)
WBC: 9.3 10e3/uL (ref 3.9–10.0)

## 2012-05-25 MED ORDER — DEXAMETHASONE SODIUM PHOSPHATE 10 MG/ML IJ SOLN
10.0000 mg | Freq: Once | INTRAMUSCULAR | Status: AC
  Administered 2012-05-25: 10 mg via INTRAVENOUS

## 2012-05-25 MED ORDER — ONDANSETRON 8 MG/50ML IVPB (CHCC)
8.0000 mg | Freq: Once | INTRAVENOUS | Status: AC
  Administered 2012-05-25: 8 mg via INTRAVENOUS

## 2012-05-25 MED ORDER — PACLITAXEL PROTEIN-BOUND CHEMO INJECTION 100 MG
100.0000 mg/m2 | Freq: Once | INTRAVENOUS | Status: AC
  Administered 2012-05-25: 150 mg via INTRAVENOUS
  Filled 2012-05-25: qty 30

## 2012-05-25 MED ORDER — SODIUM CHLORIDE 0.9 % IV SOLN
Freq: Once | INTRAVENOUS | Status: AC
  Administered 2012-05-25: 10:00:00 via INTRAVENOUS

## 2012-05-25 NOTE — Patient Instructions (Signed)

## 2012-06-01 ENCOUNTER — Ambulatory Visit (HOSPITAL_BASED_OUTPATIENT_CLINIC_OR_DEPARTMENT_OTHER): Payer: Medicare Other

## 2012-06-01 ENCOUNTER — Other Ambulatory Visit: Payer: Medicare Other | Admitting: Lab

## 2012-06-01 ENCOUNTER — Ambulatory Visit: Payer: Medicare Other

## 2012-06-01 DIAGNOSIS — C7952 Secondary malignant neoplasm of bone marrow: Secondary | ICD-10-CM

## 2012-06-01 DIAGNOSIS — C50919 Malignant neoplasm of unspecified site of unspecified female breast: Secondary | ICD-10-CM

## 2012-06-01 DIAGNOSIS — Z5111 Encounter for antineoplastic chemotherapy: Secondary | ICD-10-CM

## 2012-06-01 DIAGNOSIS — C50119 Malignant neoplasm of central portion of unspecified female breast: Secondary | ICD-10-CM

## 2012-06-01 DIAGNOSIS — C787 Secondary malignant neoplasm of liver and intrahepatic bile duct: Secondary | ICD-10-CM

## 2012-06-01 LAB — CBC WITH DIFFERENTIAL (CANCER CENTER ONLY)
BASO%: 0.3 % (ref 0.0–2.0)
EOS%: 1.1 % (ref 0.0–7.0)
HCT: 36.6 % (ref 34.8–46.6)
LYMPH#: 3.2 10*3/uL (ref 0.9–3.3)
LYMPH%: 43.6 % (ref 14.0–48.0)
MCHC: 33.3 g/dL (ref 32.0–36.0)
NEUT%: 48.6 % (ref 39.6–80.0)
Platelets: 196 10*3/uL (ref 145–400)
RDW: 12.5 % (ref 11.1–15.7)

## 2012-06-01 MED ORDER — DEXAMETHASONE SODIUM PHOSPHATE 10 MG/ML IJ SOLN
10.0000 mg | Freq: Once | INTRAMUSCULAR | Status: AC
  Administered 2012-06-01: 10 mg via INTRAVENOUS

## 2012-06-01 MED ORDER — SODIUM CHLORIDE 0.9 % IV SOLN
Freq: Once | INTRAVENOUS | Status: AC
  Administered 2012-06-01: 14:00:00 via INTRAVENOUS

## 2012-06-01 MED ORDER — ONDANSETRON 8 MG/50ML IVPB (CHCC)
8.0000 mg | Freq: Once | INTRAVENOUS | Status: AC
  Administered 2012-06-01: 8 mg via INTRAVENOUS

## 2012-06-01 MED ORDER — PACLITAXEL PROTEIN-BOUND CHEMO INJECTION 100 MG
100.0000 mg/m2 | Freq: Once | INTRAVENOUS | Status: AC
  Administered 2012-06-01: 150 mg via INTRAVENOUS
  Filled 2012-06-01: qty 30

## 2012-06-01 NOTE — Patient Instructions (Signed)

## 2012-06-11 ENCOUNTER — Other Ambulatory Visit: Payer: Self-pay | Admitting: Family Medicine

## 2012-06-15 ENCOUNTER — Other Ambulatory Visit (HOSPITAL_BASED_OUTPATIENT_CLINIC_OR_DEPARTMENT_OTHER): Payer: Medicare Other | Admitting: Lab

## 2012-06-15 ENCOUNTER — Ambulatory Visit (HOSPITAL_BASED_OUTPATIENT_CLINIC_OR_DEPARTMENT_OTHER): Payer: Medicare Other

## 2012-06-15 ENCOUNTER — Ambulatory Visit (HOSPITAL_BASED_OUTPATIENT_CLINIC_OR_DEPARTMENT_OTHER): Payer: Medicare Other | Admitting: Medical

## 2012-06-15 VITALS — BP 144/75 | HR 90 | Temp 97.6°F | Resp 18 | Ht 59.0 in | Wt 122.0 lb

## 2012-06-15 DIAGNOSIS — C50919 Malignant neoplasm of unspecified site of unspecified female breast: Secondary | ICD-10-CM

## 2012-06-15 DIAGNOSIS — C773 Secondary and unspecified malignant neoplasm of axilla and upper limb lymph nodes: Secondary | ICD-10-CM

## 2012-06-15 DIAGNOSIS — C50119 Malignant neoplasm of central portion of unspecified female breast: Secondary | ICD-10-CM

## 2012-06-15 DIAGNOSIS — G4700 Insomnia, unspecified: Secondary | ICD-10-CM

## 2012-06-15 DIAGNOSIS — C7951 Secondary malignant neoplasm of bone: Secondary | ICD-10-CM

## 2012-06-15 DIAGNOSIS — C787 Secondary malignant neoplasm of liver and intrahepatic bile duct: Secondary | ICD-10-CM

## 2012-06-15 DIAGNOSIS — C7952 Secondary malignant neoplasm of bone marrow: Secondary | ICD-10-CM

## 2012-06-15 DIAGNOSIS — M810 Age-related osteoporosis without current pathological fracture: Secondary | ICD-10-CM

## 2012-06-15 DIAGNOSIS — Z5111 Encounter for antineoplastic chemotherapy: Secondary | ICD-10-CM

## 2012-06-15 LAB — COMPREHENSIVE METABOLIC PANEL
BUN: 19 mg/dL (ref 6–23)
CO2: 25 mEq/L (ref 19–32)
Glucose, Bld: 126 mg/dL — ABNORMAL HIGH (ref 70–99)
Sodium: 144 mEq/L (ref 135–145)
Total Bilirubin: 0.6 mg/dL (ref 0.3–1.2)
Total Protein: 6.3 g/dL (ref 6.0–8.3)

## 2012-06-15 LAB — CBC WITH DIFFERENTIAL (CANCER CENTER ONLY)
BASO#: 0 10*3/uL (ref 0.0–0.2)
Eosinophils Absolute: 0.1 10*3/uL (ref 0.0–0.5)
HCT: 38 % (ref 34.8–46.6)
HGB: 12.9 g/dL (ref 11.6–15.9)
LYMPH%: 30.6 % (ref 14.0–48.0)
MCH: 33.1 pg (ref 26.0–34.0)
MCV: 97 fL (ref 81–101)
MONO#: 0.7 10*3/uL (ref 0.1–0.9)
MONO%: 7.4 % (ref 0.0–13.0)
NEUT%: 61.1 % (ref 39.6–80.0)
Platelets: 194 10*3/uL (ref 145–400)
RBC: 3.9 10*6/uL (ref 3.70–5.32)
WBC: 9.9 10*3/uL (ref 3.9–10.0)

## 2012-06-15 LAB — CANCER ANTIGEN 27.29: CA 27.29: 529 U/mL — ABNORMAL HIGH (ref 0–39)

## 2012-06-15 MED ORDER — PACLITAXEL PROTEIN-BOUND CHEMO INJECTION 100 MG
100.0000 mg/m2 | Freq: Once | INTRAVENOUS | Status: AC
  Administered 2012-06-15: 150 mg via INTRAVENOUS
  Filled 2012-06-15: qty 30

## 2012-06-15 MED ORDER — ONDANSETRON 8 MG/50ML IVPB (CHCC)
8.0000 mg | Freq: Once | INTRAVENOUS | Status: AC
  Administered 2012-06-15: 8 mg via INTRAVENOUS

## 2012-06-15 MED ORDER — DEXAMETHASONE SODIUM PHOSPHATE 10 MG/ML IJ SOLN
10.0000 mg | Freq: Once | INTRAMUSCULAR | Status: AC
  Administered 2012-06-15: 10 mg via INTRAVENOUS

## 2012-06-15 MED ORDER — TEMAZEPAM 15 MG PO CAPS: 15.0000 mg | ORAL_CAPSULE | Freq: Every evening | ORAL | Status: DC | PRN

## 2012-06-15 MED ORDER — ZOLEDRONIC ACID 4 MG/100ML IV SOLN
4.0000 mg | Freq: Once | INTRAVENOUS | Status: AC
  Administered 2012-06-15: 4 mg via INTRAVENOUS
  Filled 2012-06-15: qty 100

## 2012-06-15 MED ORDER — TEMAZEPAM 30 MG PO CAPS: 15.0000 mg | ORAL_CAPSULE | Freq: Every evening | ORAL | Status: DC | PRN

## 2012-06-15 MED ORDER — SODIUM CHLORIDE 0.9 % IV SOLN
Freq: Once | INTRAVENOUS | Status: AC
  Administered 2012-06-15: 11:00:00 via INTRAVENOUS

## 2012-06-15 NOTE — Patient Instructions (Addendum)
Nanoparticle Albumin-Bound Paclitaxel injection What is this medicine? NANOPARTICLE ALBUMIN-BOUND PACLITAXEL (Na no PAHR ti kuhl al BYOO muhn-bound PAK li TAX el) is a chemotherapy drug. It targets fast dividing cells, like cancer cells, and causes these cells to die. This medicine is used to treat advanced breast cancer and advanced lung cancer. This medicine may be used for other purposes; ask your health care provider or pharmacist if you have questions. What should I tell my health care provider before I take this medicine? They need to know if you have any of these conditions: -kidney disease -liver disease -low blood counts, like low platelets, red blood cells, or white blood cells -recent or ongoing radiation therapy -an unusual or allergic reaction to paclitaxel, albumin, other chemotherapy, other medicines, foods, dyes, or preservatives -pregnant or trying to get pregnant -breast-feeding How should I use this medicine? This drug is given as an infusion into a vein. It is administered in a hospital or clinic by a specially trained health care professional. Talk to your pediatrician regarding the use of this medicine in children. Special care may be needed. Overdosage: If you think you have taken too much of this medicine contact a poison control center or emergency room at once. NOTE: This medicine is only for you. Do not share this medicine with others. What if I miss a dose? It is important not to miss your dose. Call your doctor or health care professional if you are unable to keep an appointment. What may interact with this medicine? This medicine may also interact with the following medications: -cyclosporine -dexamethasone -diazepam -ketoconazole -medicines to increase blood counts like filgrastim, pegfilgrastim, sargramostim -other chemotherapy drugs like cisplatin, doxorubicin, epirubicin, etoposide, teniposide,  vincristine -quinidine -testosterone -vaccines -verapamil Talk to your doctor or health care professional before taking any of these medicines: -acetaminophen -aspirin -ibuprofen -ketoprofen -naproxen This list may not describe all possible interactions. Give your health care provider a list of all the medicines, herbs, non-prescription drugs, or dietary supplements you use. Also tell them if you smoke, drink alcohol, or use illegal drugs. Some items may interact with your medicine. What should I watch for while using this medicine? Your condition will be monitored carefully while you are receiving this medicine. You will need important blood work done while you are taking this medicine. This drug may make you feel generally unwell. This is not uncommon, as chemotherapy can affect healthy cells as well as cancer cells. Report any side effects. Continue your course of treatment even though you feel ill unless your doctor tells you to stop. In some cases, you may be given additional medicines to help with side effects. Follow all directions for their use. Call your doctor or health care professional for advice if you get a fever, chills or sore throat, or other symptoms of a cold or flu. Do not treat yourself. This drug decreases your body's ability to fight infections. Try to avoid being around people who are sick. This medicine may increase your risk to bruise or bleed. Call your doctor or health care professional if you notice any unusual bleeding. Be careful brushing and flossing your teeth or using a toothpick because you may get an infection or bleed more easily. If you have any dental work done, tell your dentist you are receiving this medicine. Avoid taking products that contain aspirin, acetaminophen, ibuprofen, naproxen, or ketoprofen unless instructed by your doctor. These medicines may hide a fever. Do not become pregnant while taking this medicine. Women should  inform their doctor if  they wish to become pregnant or think they might be pregnant. There is a potential for serious side effects to an unborn child. Talk to your health care professional or pharmacist for more information. Do not breast-feed an infant while taking this medicine. Men are advised not to father a child while receiving this medicine. What side effects may I notice from receiving this medicine? Side effects that you should report to your doctor or health care professional as soon as possible: -allergic reactions like skin rash, itching or hives, swelling of the face, lips, or tongue -low blood counts - This drug may decrease the number of white blood cells, red blood cells and platelets. You may be at increased risk for infections and bleeding. -signs of infection - fever or chills, cough, sore throat, pain or difficulty passing urine -signs of decreased platelets or bleeding - bruising, pinpoint red spots on the skin, black, tarry stools, nosebleeds -signs of decreased red blood cells - unusually weak or tired, fainting spells, lightheadedness -breathing problems -changes in vision -chest pain -high or low blood pressure -mouth sores -nausea and vomiting -pain, swelling, redness or irritation at the injection site -pain, tingling, numbness in the hands or feet -slow or irregular heartbeat -swelling of the ankle, feet, hands Side effects that usually do not require medical attention (report to your doctor or health care professional if they continue or are bothersome): -aches, pains -changes in the color of fingernails -diarrhea -hair loss -loss of appetite This list may not describe all possible side effects. Call your doctor for medical advice about side effects. You may report side effects to FDA at 1-800-FDA-1088. Where should I keep my medicine? This drug is given in a hospital or clinic and will not be stored at home. NOTE: This sheet is a summary. It may not cover all possible information.  If you have questions about this medicine, talk to your doctor, pharmacist, or health care provider.  2013, [employer]. (04/01/2011 4:13:49 PM) Zoledronic Acid injection (Hypercalcemia, Oncology) What is this medicine? ZOLEDRONIC ACID (ZOE le dron ik AS id) lowers the amount of calcium loss from bone. It is used to treat too much calcium in your blood from cancer. It is also used to prevent complications of cancer that has spread to the bone. This medicine may be used for other purposes; ask your health care provider or pharmacist if you have questions. What should I tell my health care provider before I take this medicine? They need to know if you have any of these conditions: -aspirin-sensitive asthma -dental disease -kidney disease -an unusual or allergic reaction to zoledronic acid, other medicines, foods, dyes, or preservatives -pregnant or trying to get pregnant -breast-feeding How should I use this medicine? This medicine is for infusion into a vein. It is given by a health care professional in a hospital or clinic setting. Talk to your pediatrician regarding the use of this medicine in children. Special care may be needed. Overdosage: If you think you have taken too much of this medicine contact a poison control center or emergency room at once. NOTE: This medicine is only for you. Do not share this medicine with others. What if I miss a dose? It is important not to miss your dose. Call your doctor or health care professional if you are unable to keep an appointment. What may interact with this medicine? -certain antibiotics given by injection -NSAIDs, medicines for pain and inflammation, like ibuprofen or naproxen -some  diuretics like bumetanide, furosemide -teriparatide -thalidomide This list may not describe all possible interactions. Give your health care provider a list of all the medicines, herbs, non-prescription drugs, or dietary supplements you use. Also tell  them if you smoke, drink alcohol, or use illegal drugs. Some items may interact with your medicine. What should I watch for while using this medicine? Visit your doctor or health care professional for regular checkups. It may be some time before you see the benefit from this medicine. Do not stop taking your medicine unless your doctor tells you to. Your doctor may order blood tests or other tests to see how you are doing. Women should inform their doctor if they wish to become pregnant or think they might be pregnant. There is a potential for serious side effects to an unborn child. Talk to your health care professional or pharmacist for more information. You should make sure that you get enough calcium and vitamin D while you are taking this medicine. Discuss the foods you eat and the vitamins you take with your health care professional. Some people who take this medicine have severe bone, joint, and/or muscle pain. This medicine may also increase your risk for a broken thigh bone. Tell your doctor right away if you have pain in your upper leg or groin. Tell your doctor if you have any pain that does not go away or that gets worse. What side effects may I notice from receiving this medicine? Side effects that you should report to your doctor or health care professional as soon as possible: -allergic reactions like skin rash, itching or hives, swelling of the face, lips, or tongue -anxiety, confusion, or depression -breathing problems -changes in vision -feeling faint or lightheaded, falls -jaw burning, cramping, pain -muscle cramps, stiffness, or weakness -trouble passing urine or change in the amount of urine Side effects that usually do not require medical attention (report to your doctor or health care professional if they continue or are bothersome): -bone, joint, or muscle pain -fever -hair loss -irritation at site where injected -loss of appetite -nausea, vomiting -stomach  upset -tired This list may not describe all possible side effects. Call your doctor for medical advice about side effects. You may report side effects to FDA at 1-800-FDA-1088. Where should I keep my medicine? This drug is given in a hospital or clinic and will not be stored at home. NOTE: This sheet is a summary. It may not cover all possible information. If you have questions about this medicine, talk to your doctor, pharmacist, or health care provider.  2012, [employer] Standard. (11/23/2010 9:06:58 AM)

## 2012-06-15 NOTE — Progress Notes (Signed)
Diagnosis: Metastatic breast cancer.  Current therapy: #1.  Status post 5 cycles of Abraxane (3 weeks on/one-week off). #2.  Zometa 4 mg IV monthly.  Interim history: , Anna Benjamin presents today for an office followup visit.  Overall, she is doing quite well.  She is tolerating her treatment very nicely.  She is extremely functional.  Her last PET scan back on November 20 showed an overall, improvement with significant reduction in size, and hypermetabolic activity of the hepatic metastatic lesions, and prominently reduced, activity of the osseous metastatic lesions.  Her CA 27.29 continues to come down quite nicely.  It was 533 and most recently it was 390.  She still continues to have some issues with sleep.  She was on Silenor and reports, that that really didn't work.  We will go ahead and give her a trial of Restoril.  She otherwise reports, that she has an excellent appetite.  She denies any nausea, vomiting, diarrhea, constipation, chest pain, shortness of breath, or cough.  She denies any fevers, chills, or night sweats.  She denies any lower leg swelling.  She denies any abdominal pain.  She denies any obvious, or abnormal bleeding.  She denies any headaches, visual changes, or rashes.  She denies any dysphasia or odynophagia.  She denies any type of neuropathy or mucositis.  She will go ahead with her sixth cycle of chemotherapy.  Today.  She will also receive Zometa.  Review of Systems: Constitutional:Negative for malaise/fatigue, fever, chills, weight loss, diaphoresis, activity change, appetite change, and unexpected weight change.  HEENT: Negative for double vision, blurred vision, visual loss, ear pain, tinnitus, congestion, rhinorrhea, epistaxis sore throat or sinus disease, oral pain/lesion, tongue soreness Respiratory: Negative for cough, chest tightness, shortness of breath, wheezing and stridor.  Cardiovascular: Negative for chest pain, palpitations, leg swelling, orthopnea, PND,  DOE or claudication Gastrointestinal: Negative for nausea, vomiting, abdominal pain, diarrhea, constipation, blood in stool, melena, hematochezia, abdominal distention, anal bleeding, rectal pain, anorexia and hematemesis.  Genitourinary: Negative for dysuria, frequency, hematuria,  Musculoskeletal: Negative for myalgias, back pain, joint swelling, arthralgias and gait problem.  Skin: Negative for rash, color change, pallor and wound.  Neurological:. Negative for dizziness/light-headedness, tremors, seizures, syncope, facial asymmetry, speech difficulty, weakness, numbness, headaches and paresthesias.  Hematological: Negative for adenopathy. Does not bruise/bleed easily.  Psychiatric/Behavioral:  Negative for depression, no loss of interest in normal activity or change in sleep pattern.   Physical Exam: This is a 77 year old, petite, white female, in no obvious distress Vitals: Temperature 97.6 degrees, pulse 90, respirations 18, blood pressure 144, 75.  Weight 122 pounds HEENT reveals a normocephalic, atraumatic skull, no scleral icterus, no oral lesions  Neck is supple without any cervical or supraclavicular adenopathy.  Lungs are clear to auscultation bilaterally. There are no wheezes, rales or rhonci Cardiac is regular rate and rhythm with a normal S1 and S2. There are no murmurs, rubs, or bruits.  Abdomen is soft with good bowel sounds, there is no palpable mass. There is no palpable hepatosplenomegaly. There is no palpable fluid wave.  Musculoskeletal no tenderness of the spine, ribs, or hips.  Extremities there are no clubbing, cyanosis, or edema.  Skin no petechia, purpura or ecchymosis Neurologic is nonfocal.  Laboratory Data: White count 7.3, hemoglobin 12.2, hematocrit 36.6, platelets 196,000   Radiography: PET SCAN 04/29/12  Impression:  , #1.  Overall, improvement, with significant reduction in size, and hypermetabolic activity of the hepatic metastatic lesions, and  prominently reduced activity in  conspicuityof the osseous metastatic lesions   Current Outpatient Prescriptions on File Prior to Visit  Medication Sig Dispense Refill  . aspirin 81 MG chewable tablet Chew 81 mg by mouth daily.        . Calcium Carbonate-Vit D-Min (CALCIUM 1200 PO) Take by mouth every morning.      . cholecalciferol (VITAMIN D) 1000 UNITS tablet Take 1,000 Units by mouth daily.        Marland Kitchen dexamethasone (DECADRON) 4 MG tablet Take 2 tablets by mouth daily starting the day after chemotherapy for 2 days. Take with food.  30 tablet  1  . Dextromethorphan-Guaifenesin (MUCINEX DM MAXIMUM STRENGTH) 60-1200 MG TB12 Take by mouth 2 (two) times daily.      Anna Benjamin Sodium (COLACE PO) Take by mouth every morning. 3 po q day.      . fexofenadine (ALLEGRA) 180 MG tablet Take 180 mg by mouth daily.        . fluticasone (FLONASE) 50 MCG/ACT nasal spray Place 2 sprays into the nose daily.  16 g  3  . HYDROcodone-acetaminophen (NORCO) 10-325 MG per tablet 1 - 2 tabs by mouth every 6 hours as needed for pain secondary to cancer.  180 tablet  0  . lisinopril-hydrochlorothiazide (PRINZIDE,ZESTORETIC) 10-12.5 MG per tablet TAKE 1 TABLET BY MOUTH DAILY  90 tablet  0  . LORazepam (ATIVAN) 0.5 MG tablet 0.5 mg as needed.       . megestrol (MEGACE) 40 MG tablet Take 1 tablet (40 mg total) by mouth daily.  30 tablet  5  . omeprazole (PRILOSEC) 20 MG capsule TAKE 1 CAPSULE BY MOUTH EVERY DAY  30 capsule  2  . omeprazole (PRILOSEC) 20 MG capsule TAKE 1 CAPSULE BY MOUTH EVERY DAY  30 capsule  2  . ondansetron (ZOFRAN) 8 MG tablet as needed.      . prochlorperazine (COMPAZINE) 10 MG tablet Take 10 mg by mouth as needed.      Marland Kitchen SILENOR 6 MG TABS Take by mouth at bedtime.        Assessment/Plan: This is a pleasant, 77 year old, white female, with the following issues:  #1.  Metastatic breast cancer.  Overall, she is responding quite nicely.  Her CA 27.29. Continues to decrease.  She is also had improvement  with her PET scan.  She will go ahead and start her 6 cycle of treatment today.  #2.  Insomnia.  I did give her prescription for Restoril.  #3.  Followup.  We will follow back up with Anna Benjamin in about a month, but before then should there be questions or concerns.

## 2012-06-23 ENCOUNTER — Other Ambulatory Visit: Payer: Medicare Other | Admitting: Lab

## 2012-06-23 ENCOUNTER — Ambulatory Visit: Payer: Medicare Other

## 2012-06-24 ENCOUNTER — Ambulatory Visit (HOSPITAL_BASED_OUTPATIENT_CLINIC_OR_DEPARTMENT_OTHER): Payer: Medicare Other

## 2012-06-24 ENCOUNTER — Other Ambulatory Visit: Payer: Medicare Other | Admitting: Lab

## 2012-06-24 VITALS — BP 111/68 | HR 85 | Temp 98.6°F | Resp 18

## 2012-06-24 DIAGNOSIS — C773 Secondary and unspecified malignant neoplasm of axilla and upper limb lymph nodes: Secondary | ICD-10-CM

## 2012-06-24 DIAGNOSIS — C50919 Malignant neoplasm of unspecified site of unspecified female breast: Secondary | ICD-10-CM

## 2012-06-24 DIAGNOSIS — Z5111 Encounter for antineoplastic chemotherapy: Secondary | ICD-10-CM

## 2012-06-24 DIAGNOSIS — C50119 Malignant neoplasm of central portion of unspecified female breast: Secondary | ICD-10-CM

## 2012-06-24 DIAGNOSIS — C787 Secondary malignant neoplasm of liver and intrahepatic bile duct: Secondary | ICD-10-CM

## 2012-06-24 MED ORDER — SODIUM CHLORIDE 0.9 % IJ SOLN
3.0000 mL | INTRAMUSCULAR | Status: DC | PRN
Start: 2012-06-24 — End: 2012-06-24
  Filled 2012-06-24: qty 10

## 2012-06-24 MED ORDER — ONDANSETRON 8 MG/50ML IVPB (CHCC)
8.0000 mg | Freq: Once | INTRAVENOUS | Status: AC
  Administered 2012-06-24: 8 mg via INTRAVENOUS

## 2012-06-24 MED ORDER — SODIUM CHLORIDE 0.9 % IV SOLN
Freq: Once | INTRAVENOUS | Status: AC
  Administered 2012-06-24: 13:00:00 via INTRAVENOUS

## 2012-06-24 MED ORDER — DEXAMETHASONE SODIUM PHOSPHATE 10 MG/ML IJ SOLN
10.0000 mg | Freq: Once | INTRAMUSCULAR | Status: AC
  Administered 2012-06-24: 10 mg via INTRAVENOUS

## 2012-06-24 MED ORDER — PACLITAXEL PROTEIN-BOUND CHEMO INJECTION 100 MG
100.0000 mg/m2 | Freq: Once | INTRAVENOUS | Status: AC
  Administered 2012-06-24: 150 mg via INTRAVENOUS
  Filled 2012-06-24: qty 30

## 2012-06-24 MED ORDER — ALTEPLASE 2 MG IJ SOLR
2.0000 mg | Freq: Once | INTRAMUSCULAR | Status: DC | PRN
  Filled 2012-06-24: qty 2

## 2012-06-24 MED ORDER — SODIUM CHLORIDE 0.9 % IJ SOLN
10.0000 mL | INTRAMUSCULAR | Status: DC | PRN
  Filled 2012-06-24: qty 10

## 2012-06-24 MED ORDER — HEPARIN SOD (PORK) LOCK FLUSH 100 UNIT/ML IV SOLN
250.0000 [IU] | Freq: Once | INTRAVENOUS | Status: DC | PRN
Start: 2012-06-24 — End: 2012-06-24
  Filled 2012-06-24: qty 5

## 2012-06-24 MED ORDER — HEPARIN SOD (PORK) LOCK FLUSH 100 UNIT/ML IV SOLN
500.0000 [IU] | Freq: Once | INTRAVENOUS | Status: DC | PRN
  Filled 2012-06-24: qty 5

## 2012-06-24 NOTE — Patient Instructions (Signed)

## 2012-06-29 ENCOUNTER — Ambulatory Visit (HOSPITAL_BASED_OUTPATIENT_CLINIC_OR_DEPARTMENT_OTHER): Payer: Medicare Other

## 2012-06-29 ENCOUNTER — Other Ambulatory Visit: Payer: Self-pay | Admitting: Hematology & Oncology

## 2012-06-29 ENCOUNTER — Other Ambulatory Visit: Payer: Medicare Other | Admitting: Lab

## 2012-06-29 VITALS — BP 122/76 | HR 93 | Temp 97.9°F

## 2012-06-29 DIAGNOSIS — C787 Secondary malignant neoplasm of liver and intrahepatic bile duct: Secondary | ICD-10-CM

## 2012-06-29 DIAGNOSIS — C7952 Secondary malignant neoplasm of bone marrow: Secondary | ICD-10-CM

## 2012-06-29 DIAGNOSIS — C50919 Malignant neoplasm of unspecified site of unspecified female breast: Secondary | ICD-10-CM

## 2012-06-29 DIAGNOSIS — Z5111 Encounter for antineoplastic chemotherapy: Secondary | ICD-10-CM

## 2012-06-29 DIAGNOSIS — C50119 Malignant neoplasm of central portion of unspecified female breast: Secondary | ICD-10-CM

## 2012-06-29 LAB — COMPREHENSIVE METABOLIC PANEL
AST: 20 U/L (ref 0–37)
Albumin: 4.4 g/dL (ref 3.5–5.2)
Alkaline Phosphatase: 56 U/L (ref 39–117)
BUN: 19 mg/dL (ref 6–23)
Creatinine, Ser: 0.75 mg/dL (ref 0.50–1.10)
Potassium: 4 mEq/L (ref 3.5–5.3)
Total Bilirubin: 0.6 mg/dL (ref 0.3–1.2)

## 2012-06-29 LAB — CBC WITH DIFFERENTIAL (CANCER CENTER ONLY)
BASO%: 0.3 % (ref 0.0–2.0)
EOS%: 0.8 % (ref 0.0–7.0)
HCT: 37.8 % (ref 34.8–46.6)
LYMPH#: 3.4 10*3/uL — ABNORMAL HIGH (ref 0.9–3.3)
MCHC: 33.1 g/dL (ref 32.0–36.0)
MONO%: 3.7 % (ref 0.0–13.0)
NEUT%: 51.2 % (ref 39.6–80.0)
RDW: 12.1 % (ref 11.1–15.7)

## 2012-06-29 MED ORDER — DEXAMETHASONE SODIUM PHOSPHATE 10 MG/ML IJ SOLN
10.0000 mg | Freq: Once | INTRAMUSCULAR | Status: AC
  Administered 2012-06-29: 10 mg via INTRAVENOUS

## 2012-06-29 MED ORDER — SODIUM CHLORIDE 0.9 % IV SOLN
Freq: Once | INTRAVENOUS | Status: AC
  Administered 2012-06-29: 10:00:00 via INTRAVENOUS

## 2012-06-29 MED ORDER — ONDANSETRON 8 MG/50ML IVPB (CHCC)
8.0000 mg | Freq: Once | INTRAVENOUS | Status: AC
  Administered 2012-06-29: 8 mg via INTRAVENOUS

## 2012-06-29 MED ORDER — PACLITAXEL PROTEIN-BOUND CHEMO INJECTION 100 MG
100.0000 mg/m2 | Freq: Once | INTRAVENOUS | Status: AC
  Administered 2012-06-29: 150 mg via INTRAVENOUS
  Filled 2012-06-29: qty 30

## 2012-06-29 NOTE — Patient Instructions (Signed)
Paclitaxel injection What is this medicine? PACLITAXEL (PAK li TAX el) is a chemotherapy drug. It targets fast dividing cells, like cancer cells, and causes these cells to die. This medicine is used to treat ovarian cancer, breast cancer, and other cancers. This medicine may be used for other purposes; ask your health care provider or pharmacist if you have questions. What should I tell my health care provider before I take this medicine? They need to know if you have any of these conditions: -blood disorders -irregular heartbeat -infection (especially a virus infection such as chickenpox, cold sores, or herpes) -liver disease -previous or ongoing radiation therapy -an unusual or allergic reaction to paclitaxel, alcohol, polyoxyethylated castor oil, other chemotherapy agents, other medicines, foods, dyes, or preservatives -pregnant or trying to get pregnant -breast-feeding How should I use this medicine? This drug is given as an infusion into a vein. It is administered in a hospital or clinic by a specially trained health care professional. Talk to your pediatrician regarding the use of this medicine in children. Special care may be needed. Overdosage: If you think you have taken too much of this medicine contact a poison control center or emergency room at once. NOTE: This medicine is only for you. Do not share this medicine with others. What if I miss a dose? It is important not to miss your dose. Call your doctor or health care professional if you are unable to keep an appointment. What may interact with this medicine? Do not take this medicine with any of the following medications: -disulfiram -metronidazole This medicine may also interact with the following medications: -cyclosporine -dexamethasone -diazepam -ketoconazole -medicines to increase blood counts like filgrastim, pegfilgrastim, sargramostim -other chemotherapy drugs like cisplatin, doxorubicin, epirubicin, etoposide,  teniposide, vincristine -quinidine -testosterone -vaccines -verapamil Talk to your doctor or health care professional before taking any of these medicines: -acetaminophen -aspirin -ibuprofen -ketoprofen -naproxen This list may not describe all possible interactions. Give your health care provider a list of all the medicines, herbs, non-prescription drugs, or dietary supplements you use. Also tell them if you smoke, drink alcohol, or use illegal drugs. Some items may interact with your medicine. What should I watch for while using this medicine? Your condition will be monitored carefully while you are receiving this medicine. You will need important blood work done while you are taking this medicine. This drug may make you feel generally unwell. This is not uncommon, as chemotherapy can affect healthy cells as well as cancer cells. Report any side effects. Continue your course of treatment even though you feel ill unless your doctor tells you to stop. In some cases, you may be given additional medicines to help with side effects. Follow all directions for their use. Call your doctor or health care professional for advice if you get a fever, chills or sore throat, or other symptoms of a cold or flu. Do not treat yourself. This drug decreases your body's ability to fight infections. Try to avoid being around people who are sick. This medicine may increase your risk to bruise or bleed. Call your doctor or health care professional if you notice any unusual bleeding. Be careful brushing and flossing your teeth or using a toothpick because you may get an infection or bleed more easily. If you have any dental work done, tell your dentist you are receiving this medicine. Avoid taking products that contain aspirin, acetaminophen, ibuprofen, naproxen, or ketoprofen unless instructed by your doctor. These medicines may hide a fever. Do not become pregnant   while taking this medicine. Women should inform their  doctor if they wish to become pregnant or think they might be pregnant. There is a potential for serious side effects to an unborn child. Talk to your health care professional or pharmacist for more information. Do not breast-feed an infant while taking this medicine. Men are advised not to father a child while receiving this medicine. What side effects may I notice from receiving this medicine? Side effects that you should report to your doctor or health care professional as soon as possible: -allergic reactions like skin rash, itching or hives, swelling of the face, lips, or tongue -low blood counts - This drug may decrease the number of white blood cells, red blood cells and platelets. You may be at increased risk for infections and bleeding. -signs of infection - fever or chills, cough, sore throat, pain or difficulty passing urine -signs of decreased platelets or bleeding - bruising, pinpoint red spots on the skin, black, tarry stools, nosebleeds -signs of decreased red blood cells - unusually weak or tired, fainting spells, lightheadedness -breathing problems -chest pain -high or low blood pressure -mouth sores -nausea and vomiting -pain, swelling, redness or irritation at the injection site -pain, tingling, numbness in the hands or feet -slow or irregular heartbeat -swelling of the ankle, feet, hands Side effects that usually do not require medical attention (report to your doctor or health care professional if they continue or are bothersome): -bone pain -complete hair loss including hair on your head, underarms, pubic hair, eyebrows, and eyelashes -changes in the color of fingernails -diarrhea -loosening of the fingernails -loss of appetite -muscle or joint pain -red flush to skin -sweating This list may not describe all possible side effects. Call your doctor for medical advice about side effects. You may report side effects to FDA at 1-800-FDA-1088. Where should I keep my  medicine? This drug is given in a hospital or clinic and will not be stored at home. NOTE: This sheet is a summary. It may not cover all possible information. If you have questions about this medicine, talk to your doctor, pharmacist, or health care provider.  2012, [employer]vier/[employer] Standard. (05/09/2008 11:54:26 AM) 

## 2012-07-06 ENCOUNTER — Telehealth: Payer: Self-pay | Admitting: Family Medicine

## 2012-07-06 ENCOUNTER — Telehealth: Payer: Self-pay | Admitting: Hematology & Oncology

## 2012-07-06 MED ORDER — FLUTICASONE PROPIONATE 50 MCG/ACT NA SUSP: 2.0000 | Freq: Every day | NASAL | Status: DC

## 2012-07-06 NOTE — Telephone Encounter (Signed)
Refill: Fluticasone 50 mcg nasal sp (120inh). Place 2 sprays in each nostril daily. Qty 16. Last fill 05-09-11

## 2012-07-06 NOTE — Telephone Encounter (Signed)
Pt moved 2-17 to 2-19

## 2012-07-08 ENCOUNTER — Other Ambulatory Visit: Payer: Self-pay | Admitting: *Deleted

## 2012-07-08 DIAGNOSIS — C50919 Malignant neoplasm of unspecified site of unspecified female breast: Secondary | ICD-10-CM

## 2012-07-08 DIAGNOSIS — F419 Anxiety disorder, unspecified: Secondary | ICD-10-CM

## 2012-07-08 MED ORDER — HYDROCODONE-ACETAMINOPHEN 10-325 MG PO TABS: ORAL_TABLET | ORAL | Status: DC

## 2012-07-13 ENCOUNTER — Ambulatory Visit (HOSPITAL_BASED_OUTPATIENT_CLINIC_OR_DEPARTMENT_OTHER): Payer: Medicare Other

## 2012-07-13 ENCOUNTER — Ambulatory Visit (HOSPITAL_BASED_OUTPATIENT_CLINIC_OR_DEPARTMENT_OTHER): Payer: Medicare Other | Admitting: Medical

## 2012-07-13 ENCOUNTER — Other Ambulatory Visit (HOSPITAL_BASED_OUTPATIENT_CLINIC_OR_DEPARTMENT_OTHER): Payer: Medicare Other | Admitting: Lab

## 2012-07-13 VITALS — BP 131/63 | HR 93 | Temp 97.8°F | Resp 16 | Ht 59.0 in | Wt 121.0 lb

## 2012-07-13 DIAGNOSIS — C7952 Secondary malignant neoplasm of bone marrow: Secondary | ICD-10-CM

## 2012-07-13 DIAGNOSIS — C50119 Malignant neoplasm of central portion of unspecified female breast: Secondary | ICD-10-CM

## 2012-07-13 DIAGNOSIS — C50919 Malignant neoplasm of unspecified site of unspecified female breast: Secondary | ICD-10-CM

## 2012-07-13 DIAGNOSIS — C787 Secondary malignant neoplasm of liver and intrahepatic bile duct: Secondary | ICD-10-CM

## 2012-07-13 DIAGNOSIS — Z5111 Encounter for antineoplastic chemotherapy: Secondary | ICD-10-CM

## 2012-07-13 DIAGNOSIS — C7951 Secondary malignant neoplasm of bone: Secondary | ICD-10-CM

## 2012-07-13 DIAGNOSIS — M810 Age-related osteoporosis without current pathological fracture: Secondary | ICD-10-CM

## 2012-07-13 LAB — CBC WITH DIFFERENTIAL (CANCER CENTER ONLY)
BASO#: 0 10*3/uL (ref 0.0–0.2)
Eosinophils Absolute: 0 10*3/uL (ref 0.0–0.5)
HCT: 39 % (ref 34.8–46.6)
LYMPH%: 38.9 % (ref 14.0–48.0)
MCH: 32.8 pg (ref 26.0–34.0)
MCV: 99 fL (ref 81–101)
MONO#: 0.7 10*3/uL (ref 0.1–0.9)
MONO%: 9.3 % (ref 0.0–13.0)
NEUT%: 50.9 % (ref 39.6–80.0)
Platelets: 189 10*3/uL (ref 145–400)
RBC: 3.93 10*6/uL (ref 3.70–5.32)
WBC: 7.7 10*3/uL (ref 3.9–10.0)

## 2012-07-13 MED ORDER — ALTEPLASE 2 MG IJ SOLR
2.0000 mg | Freq: Once | INTRAMUSCULAR | Status: DC | PRN
  Filled 2012-07-13: qty 2

## 2012-07-13 MED ORDER — ONDANSETRON 8 MG/50ML IVPB (CHCC)
8.0000 mg | Freq: Once | INTRAVENOUS | Status: AC
  Administered 2012-07-13: 8 mg via INTRAVENOUS

## 2012-07-13 MED ORDER — HEPARIN SOD (PORK) LOCK FLUSH 100 UNIT/ML IV SOLN
500.0000 [IU] | Freq: Once | INTRAVENOUS | Status: DC | PRN
  Filled 2012-07-13: qty 5

## 2012-07-13 MED ORDER — SODIUM CHLORIDE 0.9 % IJ SOLN
3.0000 mL | Freq: Once | INTRAMUSCULAR | Status: DC | PRN
  Filled 2012-07-13: qty 10

## 2012-07-13 MED ORDER — HEPARIN SOD (PORK) LOCK FLUSH 100 UNIT/ML IV SOLN
250.0000 [IU] | Freq: Once | INTRAVENOUS | Status: DC | PRN
  Filled 2012-07-13: qty 5

## 2012-07-13 MED ORDER — SODIUM CHLORIDE 0.9 % IJ SOLN
10.0000 mL | INTRAMUSCULAR | Status: DC | PRN
  Filled 2012-07-13: qty 10

## 2012-07-13 MED ORDER — DEXAMETHASONE SODIUM PHOSPHATE 10 MG/ML IJ SOLN
10.0000 mg | Freq: Once | INTRAMUSCULAR | Status: AC
  Administered 2012-07-13: 10 mg via INTRAVENOUS

## 2012-07-13 MED ORDER — SODIUM CHLORIDE 0.9 % IV SOLN
Freq: Once | INTRAVENOUS | Status: AC
  Administered 2012-07-13: 12:00:00 via INTRAVENOUS

## 2012-07-13 MED ORDER — PACLITAXEL PROTEIN-BOUND CHEMO INJECTION 100 MG
100.0000 mg/m2 | Freq: Once | INTRAVENOUS | Status: AC
  Administered 2012-07-13: 150 mg via INTRAVENOUS
  Filled 2012-07-13: qty 30

## 2012-07-13 MED ORDER — ZOLEDRONIC ACID 4 MG/100ML IV SOLN
4.0000 mg | Freq: Once | INTRAVENOUS | Status: AC
  Administered 2012-07-13: 4 mg via INTRAVENOUS
  Filled 2012-07-13: qty 100

## 2012-07-13 MED ORDER — SODIUM CHLORIDE 0.9 % IJ SOLN
3.0000 mL | INTRAMUSCULAR | Status: DC | PRN
  Filled 2012-07-13: qty 10

## 2012-07-13 NOTE — Progress Notes (Addendum)
Diagnosis: Metastatic breast cancer.  Current therapy: #1.  Status post 6 cycles of Abraxane (3 weeks on/one-week off). #2.  Zometa 4 mg IV monthly.  Interim history: , Anna Benjamin presents today for an office followup visit.  Overall, she is doing quite well.  She is tolerating her treatment very nicely.  She is extremely functional.  Her last PET scan back on November 20 showed an overall, improvement with significant reduction in size, and hypermetabolic activity of the hepatic metastatic lesions, and prominently reduced, activity of the osseous metastatic lesions.  We still continue to monitor her CA 27.29.  Back on January 20 was 449.  Prior to that.  It was 529.    She otherwise reports, that she has an excellent appetite.  She denies any nausea, vomiting, diarrhea, constipation, chest pain, shortness of breath, or cough.  She denies any fevers, chills, or night sweats.  She denies any lower leg swelling.  She denies any abdominal pain.  She denies any obvious, or abnormal bleeding.  She denies any headaches, visual changes, or rashes.  She denies any dysphasia or odynophagia.  She denies any type of neuropathy or mucositis.  She will go ahead with her sixth cycle of chemotherapy.  Today.  She will also receive Zometa.  Review of Systems: Constitutional:Negative for malaise/fatigue, fever, chills, weight loss, diaphoresis, activity change, appetite change, and unexpected weight change.  HEENT: Negative for double vision, blurred vision, visual loss, ear pain, tinnitus, congestion, rhinorrhea, epistaxis sore throat or sinus disease, oral pain/lesion, tongue soreness Respiratory: Negative for cough, chest tightness, shortness of breath, wheezing and stridor.  Cardiovascular: Negative for chest pain, palpitations, leg swelling, orthopnea, PND, DOE or claudication Gastrointestinal: Negative for nausea, vomiting, abdominal pain, diarrhea, constipation, blood in stool, melena, hematochezia, abdominal  distention, anal bleeding, rectal pain, anorexia and hematemesis.  Genitourinary: Negative for dysuria, frequency, hematuria,  Musculoskeletal: Negative for myalgias, back pain, joint swelling, arthralgias and gait problem.  Skin: Negative for rash, color change, pallor and wound.  Neurological:. Negative for dizziness/light-headedness, tremors, seizures, syncope, facial asymmetry, speech difficulty, weakness, numbness, headaches and paresthesias.  Hematological: Negative for adenopathy. Does not bruise/bleed easily.  Psychiatric/Behavioral:  Negative for depression, no loss of interest in normal activity or change in sleep pattern.   Physical Exam: This is a 77 year old, petite, white female, in no obvious distress Vitals: Temperature 97.8 degrees, pulse 93, respirations 16, blood pressure 131/63, weight 121 pounds HEENT reveals a normocephalic, atraumatic skull, no scleral icterus, no oral lesions  Neck is supple without any cervical or supraclavicular adenopathy.  Lungs are clear to auscultation bilaterally. There are no wheezes, rales or rhonci Cardiac is regular rate and rhythm with a normal S1 and S2. There are no murmurs, rubs, or bruits.  Abdomen is soft with good bowel sounds, there is no palpable mass. There is no palpable hepatosplenomegaly. There is no palpable fluid wave.  Musculoskeletal no tenderness of the spine, ribs, or hips.  Extremities there are no clubbing, cyanosis, or edema.  Skin no petechia, purpura or ecchymosis Neurologic is nonfocal.  Laboratory Data: White count 7.7, hemoglobin 12.9, hematocrit 39.0, platelets 189,000   Radiography: PET SCAN 04/29/12  Impression:  , #1.  Overall, improvement, with significant reduction in size, and hypermetabolic activity of the hepatic metastatic lesions, and prominently reduced activity in conspicuityof the osseous metastatic lesions   Current Outpatient Prescriptions on File Prior to Visit  Medication Sig Dispense Refill   . aspirin 81 MG chewable tablet Chew 81 mg  by mouth daily.        . Calcium Carbonate-Vit D-Min (CALCIUM 1200 PO) Take by mouth every morning.      . cholecalciferol (VITAMIN D) 1000 UNITS tablet Take 1,000 Units by mouth daily.        Marland Kitchen dexamethasone (DECADRON) 4 MG tablet Take 2 tablets by mouth daily starting the day after chemotherapy for 2 days. Take with food.  30 tablet  1  . Dextromethorphan-Guaifenesin (MUCINEX DM MAXIMUM STRENGTH) 60-1200 MG TB12 Take by mouth 2 (two) times daily.      [name] Sodium (COLACE PO) Take by mouth every morning. 3 po q day.      . fexofenadine (ALLEGRA) 180 MG tablet Take 180 mg by mouth daily.        . fluticasone (FLONASE) 50 MCG/ACT nasal spray Place 2 sprays into the nose daily.  16 g  3  . HYDROcodone-acetaminophen (NORCO) 10-325 MG per tablet 1 - 2 tabs by mouth every 6 hours as needed for pain secondary to cancer.  180 tablet  0  . lisinopril-hydrochlorothiazide (PRINZIDE,ZESTORETIC) 10-12.5 MG per tablet TAKE 1 TABLET BY MOUTH DAILY  90 tablet  0  . LORazepam (ATIVAN) 0.5 MG tablet 0.5 mg as needed.       . megestrol (MEGACE) 40 MG tablet Take 1 tablet (40 mg total) by mouth daily.  30 tablet  5  . omeprazole (PRILOSEC) 20 MG capsule TAKE 1 CAPSULE BY MOUTH EVERY DAY  30 capsule  2  . omeprazole (PRILOSEC) 20 MG capsule TAKE 1 CAPSULE BY MOUTH EVERY DAY  30 capsule  2  . ondansetron (ZOFRAN) 8 MG tablet as needed.      . prochlorperazine (COMPAZINE) 10 MG tablet Take 10 mg by mouth as needed.       Assessment/Plan: This is a pleasant, 77 year old, white female, with the following issues:  #1.  Metastatic breast cancer.  Overall, she is responding quite nicely.  Her CA 27.29. Continues to decrease.  She is also had improvement with her PET scan.  She will go ahead and start her 7th cycle of treatment today.  #2.  Insomnia.  .  She remains on Restoril.    #3.  Supportive therapy.  She continues to receive Zometa monthly.  #4.  Followup.  We  will follow back up with Anna Benjamin in about a month, but before then should there be questions or concerns.

## 2012-07-13 NOTE — Patient Instructions (Addendum)
[facility] Discharge Instructions for Patients Receiving Chemotherapy  Today you received the following chemotherapy agents Abraxane To help prevent nausea and vomiting after your treatment, we encourage you to take your nausea medication as prescribed. If you develop nausea and vomiting that is not controlled by your nausea medication, call the clinic. If it is after clinic hours your family physician or the after hours number for the clinic or go to the Emergency Department.   BELOW ARE SYMPTOMS THAT SHOULD BE REPORTED IMMEDIATELY:  *FEVER GREATER THAN 100.5 F  *CHILLS WITH OR WITHOUT FEVER  NAUSEA AND VOMITING THAT IS NOT CONTROLLED WITH YOUR NAUSEA MEDICATION  *UNUSUAL SHORTNESS OF BREATH  *UNUSUAL BRUISING OR BLEEDING  TENDERNESS IN MOUTH AND THROAT WITH OR WITHOUT PRESENCE OF ULCERS  *URINARY PROBLEMS  *BOWEL PROBLEMS  UNUSUAL RASH Items with * indicate a potential emergency and should be followed up as soon as possible.  One of the nurses will contact you 24 hours after your treatment. Please let the nurse know about any problems that you may have experienced. Feel free to call the clinic you have any questions or concerns. The clinic phone number is [phone].   I have been informed and understand all the instructions given to me. I know to contact the clinic, my physician, or go to the Emergency Department if any problems should occur. I do not have any questions at this time, but understand that I may call the clinic during office hours   should I have any questions or need assistance in obtaining follow up care.    __________________________________________  _____________  __________ Signature of Patient or Authorized Representative            Date                   Time    __________________________________________ Nurse's Signature    

## 2012-07-20 ENCOUNTER — Other Ambulatory Visit (HOSPITAL_BASED_OUTPATIENT_CLINIC_OR_DEPARTMENT_OTHER): Payer: Medicare Other | Admitting: Lab

## 2012-07-20 ENCOUNTER — Ambulatory Visit (HOSPITAL_BASED_OUTPATIENT_CLINIC_OR_DEPARTMENT_OTHER): Payer: Medicare Other

## 2012-07-20 VITALS — BP 141/78 | HR 81 | Temp 96.9°F | Resp 18

## 2012-07-20 DIAGNOSIS — C50919 Malignant neoplasm of unspecified site of unspecified female breast: Secondary | ICD-10-CM

## 2012-07-20 DIAGNOSIS — C787 Secondary malignant neoplasm of liver and intrahepatic bile duct: Secondary | ICD-10-CM

## 2012-07-20 DIAGNOSIS — C7951 Secondary malignant neoplasm of bone: Secondary | ICD-10-CM

## 2012-07-20 DIAGNOSIS — M810 Age-related osteoporosis without current pathological fracture: Secondary | ICD-10-CM

## 2012-07-20 DIAGNOSIS — C50119 Malignant neoplasm of central portion of unspecified female breast: Secondary | ICD-10-CM

## 2012-07-20 DIAGNOSIS — Z5111 Encounter for antineoplastic chemotherapy: Secondary | ICD-10-CM

## 2012-07-20 LAB — CBC WITH DIFFERENTIAL (CANCER CENTER ONLY)
BASO#: 0 10e3/uL (ref 0.0–0.2)
BASO%: 0.1 % (ref 0.0–2.0)
EOS%: 0.9 % (ref 0.0–7.0)
Eosinophils Absolute: 0.1 10e3/uL (ref 0.0–0.5)
HCT: 36.1 % (ref 34.8–46.6)
HGB: 12.1 g/dL (ref 11.6–15.9)
LYMPH#: 2.3 10e3/uL (ref 0.9–3.3)
LYMPH%: 33 % (ref 14.0–48.0)
MCH: 32.8 pg (ref 26.0–34.0)
MCHC: 33.5 g/dL (ref 32.0–36.0)
MCV: 98 fL (ref 81–101)
MONO#: 0.5 10e3/uL (ref 0.1–0.9)
MONO%: 7.4 % (ref 0.0–13.0)
NEUT#: 4.1 10e3/uL (ref 1.5–6.5)
NEUT%: 58.6 % (ref 39.6–80.0)
Platelets: 227 10e3/uL (ref 145–400)
RBC: 3.69 10e6/uL — ABNORMAL LOW (ref 3.70–5.32)
RDW: 12.6 % (ref 11.1–15.7)
WBC: 7 10e3/uL (ref 3.9–10.0)

## 2012-07-20 LAB — BASIC METABOLIC PANEL WITH GFR
BUN: 21 mg/dL (ref 6–23)
CO2: 24 meq/L (ref 19–32)
Calcium: 9.7 mg/dL (ref 8.4–10.5)
Chloride: 108 meq/L (ref 96–112)
Creatinine, Ser: 0.87 mg/dL (ref 0.50–1.10)
Glucose, Bld: 111 mg/dL — ABNORMAL HIGH (ref 70–99)
Potassium: 3.9 meq/L (ref 3.5–5.3)
Sodium: 140 meq/L (ref 135–145)

## 2012-07-20 LAB — TECHNOLOGIST REVIEW CHCC SATELLITE

## 2012-07-20 MED ORDER — DEXAMETHASONE SODIUM PHOSPHATE 10 MG/ML IJ SOLN
10.0000 mg | Freq: Once | INTRAMUSCULAR | Status: AC
  Administered 2012-07-20: 10 mg via INTRAVENOUS

## 2012-07-20 MED ORDER — SODIUM CHLORIDE 0.9 % IV SOLN
Freq: Once | INTRAVENOUS | Status: AC
  Administered 2012-07-20: 11:00:00 via INTRAVENOUS

## 2012-07-20 MED ORDER — PACLITAXEL PROTEIN-BOUND CHEMO INJECTION 100 MG
100.0000 mg/m2 | Freq: Once | INTRAVENOUS | Status: AC
  Administered 2012-07-20: 150 mg via INTRAVENOUS
  Filled 2012-07-20: qty 30

## 2012-07-20 MED ORDER — ONDANSETRON 8 MG/50ML IVPB (CHCC)
8.0000 mg | Freq: Once | INTRAVENOUS | Status: AC
  Administered 2012-07-20: 8 mg via INTRAVENOUS

## 2012-07-20 NOTE — Patient Instructions (Addendum)
[facility] Discharge Instructions for Patients Receiving Chemotherapy  Today you received the following chemotherapy agents Abraxane To help prevent nausea and vomiting after your treatment, we encourage you to take your nausea medication as prescribed. If you develop nausea and vomiting that is not controlled by your nausea medication, call the clinic. If it is after clinic hours your family physician or the after hours number for the clinic or go to the Emergency Department.   BELOW ARE SYMPTOMS THAT SHOULD BE REPORTED IMMEDIATELY:  *FEVER GREATER THAN 100.5 F  *CHILLS WITH OR WITHOUT FEVER  NAUSEA AND VOMITING THAT IS NOT CONTROLLED WITH YOUR NAUSEA MEDICATION  *UNUSUAL SHORTNESS OF BREATH  *UNUSUAL BRUISING OR BLEEDING  TENDERNESS IN MOUTH AND THROAT WITH OR WITHOUT PRESENCE OF ULCERS  *URINARY PROBLEMS  *BOWEL PROBLEMS  UNUSUAL RASH Items with * indicate a potential emergency and should be followed up as soon as possible.  One of the nurses will contact you 24 hours after your treatment. Please let the nurse know about any problems that you may have experienced. Feel free to call the clinic you have any questions or concerns. The clinic phone number is [phone].   I have been informed and understand all the instructions given to me. I know to contact the clinic, my physician, or go to the Emergency Department if any problems should occur. I do not have any questions at this time, but understand that I may call the clinic during office hours   should I have any questions or need assistance in obtaining follow up care.    __________________________________________  _____________  __________ Signature of Patient or Authorized Representative            Date                   Time    __________________________________________ Nurse's Signature    

## 2012-07-27 ENCOUNTER — Other Ambulatory Visit: Payer: Medicare Other | Admitting: Lab

## 2012-07-27 ENCOUNTER — Ambulatory Visit: Payer: Medicare Other

## 2012-07-29 ENCOUNTER — Other Ambulatory Visit: Payer: Medicare Other | Admitting: Lab

## 2012-07-29 ENCOUNTER — Ambulatory Visit (HOSPITAL_BASED_OUTPATIENT_CLINIC_OR_DEPARTMENT_OTHER): Payer: Medicare Other

## 2012-07-29 VITALS — BP 134/81 | HR 86 | Temp 98.1°F

## 2012-07-29 DIAGNOSIS — C50119 Malignant neoplasm of central portion of unspecified female breast: Secondary | ICD-10-CM

## 2012-07-29 DIAGNOSIS — C50919 Malignant neoplasm of unspecified site of unspecified female breast: Secondary | ICD-10-CM

## 2012-07-29 DIAGNOSIS — C7952 Secondary malignant neoplasm of bone marrow: Secondary | ICD-10-CM

## 2012-07-29 DIAGNOSIS — Z5111 Encounter for antineoplastic chemotherapy: Secondary | ICD-10-CM

## 2012-07-29 DIAGNOSIS — C787 Secondary malignant neoplasm of liver and intrahepatic bile duct: Secondary | ICD-10-CM

## 2012-07-29 LAB — CBC WITH DIFFERENTIAL (CANCER CENTER ONLY)
BASO%: 0.1 % (ref 0.0–2.0)
Eosinophils Absolute: 0.1 10*3/uL (ref 0.0–0.5)
LYMPH#: 3.2 10*3/uL (ref 0.9–3.3)
LYMPH%: 46.3 % (ref 14.0–48.0)
MONO#: 0.6 10*3/uL (ref 0.1–0.9)
NEUT#: 3.1 10*3/uL (ref 1.5–6.5)
Platelets: 208 10*3/uL (ref 145–400)
RBC: 3.81 10*6/uL (ref 3.70–5.32)
RDW: 12.6 % (ref 11.1–15.7)
WBC: 6.9 10*3/uL (ref 3.9–10.0)

## 2012-07-29 MED ORDER — PACLITAXEL PROTEIN-BOUND CHEMO INJECTION 100 MG
100.0000 mg/m2 | Freq: Once | INTRAVENOUS | Status: AC
  Administered 2012-07-29: 150 mg via INTRAVENOUS
  Filled 2012-07-29: qty 30

## 2012-07-29 MED ORDER — ONDANSETRON 8 MG/50ML IVPB (CHCC)
8.0000 mg | Freq: Once | INTRAVENOUS | Status: AC
  Administered 2012-07-29: 8 mg via INTRAVENOUS

## 2012-07-29 MED ORDER — DEXAMETHASONE SODIUM PHOSPHATE 10 MG/ML IJ SOLN
10.0000 mg | Freq: Once | INTRAMUSCULAR | Status: AC
  Administered 2012-07-29: 10 mg via INTRAVENOUS

## 2012-07-29 MED ORDER — SODIUM CHLORIDE 0.9 % IV SOLN
Freq: Once | INTRAVENOUS | Status: AC
  Administered 2012-07-29: 12:00:00 via INTRAVENOUS

## 2012-07-29 NOTE — Patient Instructions (Addendum)

## 2012-07-31 ENCOUNTER — Other Ambulatory Visit: Payer: Self-pay | Admitting: *Deleted

## 2012-07-31 DIAGNOSIS — C50919 Malignant neoplasm of unspecified site of unspecified female breast: Secondary | ICD-10-CM

## 2012-07-31 MED ORDER — TEMAZEPAM 15 MG PO CAPS: 15.0000 mg | ORAL_CAPSULE | Freq: Every evening | ORAL | Status: DC | PRN

## 2012-07-31 NOTE — Telephone Encounter (Signed)
Received refill request from [facility] for temazepam. This is a chronic med for the pt. Called to [facility] at [phone].

## 2012-08-10 ENCOUNTER — Telehealth: Payer: Self-pay | Admitting: Hematology & Oncology

## 2012-08-10 ENCOUNTER — Ambulatory Visit: Payer: Medicare Other | Admitting: Medical

## 2012-08-10 ENCOUNTER — Ambulatory Visit: Payer: Medicare Other

## 2012-08-10 ENCOUNTER — Other Ambulatory Visit: Payer: Medicare Other | Admitting: Lab

## 2012-08-10 NOTE — Telephone Encounter (Signed)
Patient called and cx 08/10/12 apt due to weather and stated she will keep 08/17/12 apt.  PA visit was added to 08/17/12 apt

## 2012-08-17 ENCOUNTER — Ambulatory Visit (HOSPITAL_BASED_OUTPATIENT_CLINIC_OR_DEPARTMENT_OTHER): Payer: Medicare Other

## 2012-08-17 ENCOUNTER — Other Ambulatory Visit: Payer: Medicare Other | Admitting: Lab

## 2012-08-17 ENCOUNTER — Other Ambulatory Visit: Payer: Self-pay | Admitting: Medical

## 2012-08-17 ENCOUNTER — Other Ambulatory Visit: Payer: Self-pay | Admitting: Oncology

## 2012-08-17 ENCOUNTER — Other Ambulatory Visit (HOSPITAL_BASED_OUTPATIENT_CLINIC_OR_DEPARTMENT_OTHER): Payer: Medicare Other | Admitting: Lab

## 2012-08-17 ENCOUNTER — Ambulatory Visit: Payer: Medicare Other

## 2012-08-17 ENCOUNTER — Ambulatory Visit (HOSPITAL_BASED_OUTPATIENT_CLINIC_OR_DEPARTMENT_OTHER): Payer: Medicare Other | Admitting: Medical

## 2012-08-17 VITALS — BP 129/62 | HR 92 | Temp 97.9°F | Resp 16 | Ht 59.0 in | Wt 123.0 lb

## 2012-08-17 DIAGNOSIS — C50919 Malignant neoplasm of unspecified site of unspecified female breast: Secondary | ICD-10-CM

## 2012-08-17 DIAGNOSIS — C50119 Malignant neoplasm of central portion of unspecified female breast: Secondary | ICD-10-CM

## 2012-08-17 DIAGNOSIS — C7951 Secondary malignant neoplasm of bone: Secondary | ICD-10-CM

## 2012-08-17 DIAGNOSIS — Z5111 Encounter for antineoplastic chemotherapy: Secondary | ICD-10-CM

## 2012-08-17 DIAGNOSIS — C787 Secondary malignant neoplasm of liver and intrahepatic bile duct: Secondary | ICD-10-CM

## 2012-08-17 LAB — CBC WITH DIFFERENTIAL (CANCER CENTER ONLY)
BASO%: 0.2 % (ref 0.0–2.0)
EOS%: 0.3 % (ref 0.0–7.0)
HCT: 38.5 % (ref 34.8–46.6)
LYMPH%: 31.5 % (ref 14.0–48.0)
MCHC: 33.2 g/dL (ref 32.0–36.0)
MCV: 99 fL (ref 81–101)
MONO%: 8 % (ref 0.0–13.0)
NEUT%: 60 % (ref 39.6–80.0)
RDW: 12.3 % (ref 11.1–15.7)

## 2012-08-17 MED ORDER — SODIUM CHLORIDE 0.9 % IV SOLN
Freq: Once | INTRAVENOUS | Status: AC
  Administered 2012-08-17: 13:00:00 via INTRAVENOUS

## 2012-08-17 MED ORDER — ONDANSETRON 8 MG/50ML IVPB (CHCC)
8.0000 mg | Freq: Once | INTRAVENOUS | Status: AC
  Administered 2012-08-17: 8 mg via INTRAVENOUS

## 2012-08-17 MED ORDER — ZOLEDRONIC ACID 4 MG/100ML IV SOLN
4.0000 mg | Freq: Once | INTRAVENOUS | Status: AC
  Administered 2012-08-17: 4 mg via INTRAVENOUS
  Filled 2012-08-17: qty 100

## 2012-08-17 MED ORDER — PACLITAXEL PROTEIN-BOUND CHEMO INJECTION 100 MG
100.0000 mg/m2 | Freq: Once | INTRAVENOUS | Status: AC
  Administered 2012-08-17: 150 mg via INTRAVENOUS
  Filled 2012-08-17: qty 30

## 2012-08-17 MED ORDER — DEXAMETHASONE SODIUM PHOSPHATE 10 MG/ML IJ SOLN
10.0000 mg | Freq: Once | INTRAMUSCULAR | Status: AC
  Administered 2012-08-17: 10 mg via INTRAVENOUS

## 2012-08-17 NOTE — Patient Instructions (Addendum)
Zoledronic Acid injection (Hypercalcemia, Oncology) What is this medicine? ZOLEDRONIC ACID (ZOE le dron ik AS id) lowers the amount of calcium loss from bone. It is used to treat too much calcium in your blood from cancer. It is also used to prevent complications of cancer that has spread to the bone. This medicine may be used for other purposes; ask your health care provider or pharmacist if you have questions. What should I tell my health care provider before I take this medicine? They need to know if you have any of these conditions: -aspirin-sensitive asthma -dental disease -kidney disease -an unusual or allergic reaction to zoledronic acid, other medicines, foods, dyes, or preservatives -pregnant or trying to get pregnant -breast-feeding How should I use this medicine? This medicine is for infusion into a vein. It is given by a health care professional in a hospital or clinic setting. Talk to your pediatrician regarding the use of this medicine in children. Special care may be needed. Overdosage: If you think you have taken too much of this medicine contact a poison control center or emergency room at once. NOTE: This medicine is only for you. Do not share this medicine with others. What if I miss a dose? It is important not to miss your dose. Call your doctor or health care professional if you are unable to keep an appointment. What may interact with this medicine? -certain antibiotics given by injection -NSAIDs, medicines for pain and inflammation, like ibuprofen or naproxen -some diuretics like bumetanide, furosemide -teriparatide -thalidomide This list may not describe all possible interactions. Give your health care provider a list of all the medicines, herbs, non-prescription drugs, or dietary supplements you use. Also tell them if you smoke, drink alcohol, or use illegal drugs. Some items may interact with your medicine. What should I watch for while using this medicine? Visit  your doctor or health care professional for regular checkups. It may be some time before you see the benefit from this medicine. Do not stop taking your medicine unless your doctor tells you to. Your doctor may order blood tests or other tests to see how you are doing. Women should inform their doctor if they wish to become pregnant or think they might be pregnant. There is a potential for serious side effects to an unborn child. Talk to your health care professional or pharmacist for more information. You should make sure that you get enough calcium and vitamin D while you are taking this medicine. Discuss the foods you eat and the vitamins you take with your health care professional. Some people who take this medicine have severe bone, joint, and/or muscle pain. This medicine may also increase your risk for a broken thigh bone. Tell your doctor right away if you have pain in your upper leg or groin. Tell your doctor if you have any pain that does not go away or that gets worse. What side effects may I notice from receiving this medicine? Side effects that you should report to your doctor or health care professional as soon as possible: -allergic reactions like skin rash, itching or hives, swelling of the face, lips, or tongue -anxiety, confusion, or depression -breathing problems -changes in vision -feeling faint or lightheaded, falls -jaw burning, cramping, pain -muscle cramps, stiffness, or weakness -trouble passing urine or change in the amount of urine Side effects that usually do not require medical attention (report to your doctor or health care professional if they continue or are bothersome): -bone, joint, or muscle pain -  fever -hair loss -irritation at site where injected -loss of appetite -nausea, vomiting -stomach upset -tired This list may not describe all possible side effects. Call your doctor for medical advice about side effects. You may report side effects to FDA at  1-800-FDA-1088. Where should I keep my medicine? This drug is given in a hospital or clinic and will not be stored at home. NOTE: This sheet is a summary. It may not cover all possible information. If you have questions about this medicine, talk to your doctor, pharmacist, or health care provider.  2013, [employer]/Gold Standard. (11/23/2010 9:06:58 AM) Nanoparticle Albumin-Bound Paclitaxel injection What is this medicine? NANOPARTICLE ALBUMIN-BOUND PACLITAXEL (Na no PAHR ti kuhl al BYOO muhn-bound PAK li TAX el) is a chemotherapy drug. It targets fast dividing cells, like cancer cells, and causes these cells to die. This medicine is used to treat advanced breast cancer and advanced lung cancer. This medicine may be used for other purposes; ask your health care provider or pharmacist if you have questions. What should I tell my health care provider before I take this medicine? They need to know if you have any of these conditions: -kidney disease -liver disease -low blood counts, like low platelets, red blood cells, or white blood cells -recent or ongoing radiation therapy -an unusual or allergic reaction to paclitaxel, albumin, other chemotherapy, other medicines, foods, dyes, or preservatives -pregnant or trying to get pregnant -breast-feeding How should I use this medicine? This drug is given as an infusion into a vein. It is administered in a hospital or clinic by a specially trained health care professional. Talk to your pediatrician regarding the use of this medicine in children. Special care may be needed. Overdosage: If you think you have taken too much of this medicine contact a poison control center or emergency room at once. NOTE: This medicine is only for you. Do not share this medicine with others. What if I miss a dose? It is important not to miss your dose. Call your doctor or health care professional if you are unable to keep an appointment. What may interact with this  medicine? This medicine may also interact with the following medications: -cyclosporine -dexamethasone -diazepam -ketoconazole -medicines to increase blood counts like filgrastim, pegfilgrastim, sargramostim -other chemotherapy drugs like cisplatin, doxorubicin, epirubicin, etoposide, teniposide, vincristine -quinidine -testosterone -vaccines -verapamil Talk to your doctor or health care professional before taking any of these medicines: -acetaminophen -aspirin -ibuprofen -ketoprofen -naproxen This list may not describe all possible interactions. Give your health care provider a list of all the medicines, herbs, non-prescription drugs, or dietary supplements you use. Also tell them if you smoke, drink alcohol, or use illegal drugs. Some items may interact with your medicine. What should I watch for while using this medicine? Your condition will be monitored carefully while you are receiving this medicine. You will need important blood work done while you are taking this medicine. This drug may make you feel generally unwell. This is not uncommon, as chemotherapy can affect healthy cells as well as cancer cells. Report any side effects. Continue your course of treatment even though you feel ill unless your doctor tells you to stop. In some cases, you may be given additional medicines to help with side effects. Follow all directions for their use. Call your doctor or health care professional for advice if you get a fever, chills or sore throat, or other symptoms of a cold or flu. Do not treat yourself. This drug decreases your body's ability to  fight infections. Try to avoid being around people who are sick. This medicine may increase your risk to bruise or bleed. Call your doctor or health care professional if you notice any unusual bleeding. Be careful brushing and flossing your teeth or using a toothpick because you may get an infection or bleed more easily. If you have any dental work  done, tell your dentist you are receiving this medicine. Avoid taking products that contain aspirin, acetaminophen, ibuprofen, naproxen, or ketoprofen unless instructed by your doctor. These medicines may hide a fever. Do not become pregnant while taking this medicine. Women should inform their doctor if they wish to become pregnant or think they might be pregnant. There is a potential for serious side effects to an unborn child. Talk to your health care professional or pharmacist for more information. Do not breast-feed an infant while taking this medicine. Men are advised not to father a child while receiving this medicine. What side effects may I notice from receiving this medicine? Side effects that you should report to your doctor or health care professional as soon as possible: -allergic reactions like skin rash, itching or hives, swelling of the face, lips, or tongue -low blood counts - This drug may decrease the number of white blood cells, red blood cells and platelets. You may be at increased risk for infections and bleeding. -signs of infection - fever or chills, cough, sore throat, pain or difficulty passing urine -signs of decreased platelets or bleeding - bruising, pinpoint red spots on the skin, black, tarry stools, nosebleeds -signs of decreased red blood cells - unusually weak or tired, fainting spells, lightheadedness -breathing problems -changes in vision -chest pain -high or low blood pressure -mouth sores -nausea and vomiting -pain, swelling, redness or irritation at the injection site -pain, tingling, numbness in the hands or feet -slow or irregular heartbeat -swelling of the ankle, feet, hands Side effects that usually do not require medical attention (report to your doctor or health care professional if they continue or are bothersome): -aches, pains -changes in the color of fingernails -diarrhea -hair loss -loss of appetite This list may not describe all possible  side effects. Call your doctor for medical advice about side effects. You may report side effects to FDA at 1-800-FDA-1088. Where should I keep my medicine? This drug is given in a hospital or clinic and will not be stored at home. NOTE: This sheet is a summary. It may not cover all possible information. If you have questions about this medicine, talk to your doctor, pharmacist, or health care provider.  2013, [employer]vier/Gold Standard. (04/01/2011 4:13:49 PM)

## 2012-08-17 NOTE — Progress Notes (Signed)
Diagnosis: Metastatic breast cancer.  Current therapy: #1.  Status post 7 cycles of Abraxane (3 weeks on/one-week off). #2.  Zometa 4 mg IV monthly.  Interim history: , Anna Benjamin presents today for an office followup visit.  Overall, she is doing quite well.  She is tolerating her treatment very nicely.  She is extremely functional.  Her last PET scan back on November 20 showed an overall, improvement with significant reduction in size, and hypermetabolic activity of the hepatic metastatic lesions, and prominently reduced, activity of the osseous metastatic lesions.  We still continue to monitor her CA 27.29.  Back on January 20 was 449.  Prior to that.  It was 529.    She otherwise reports, that she has an excellent appetite.  She denies any nausea, vomiting, diarrhea, constipation, chest pain, shortness of breath, or cough.  She denies any fevers, chills, or night sweats.  She denies any lower leg swelling.  She denies any abdominal pain.  She denies any obvious, or abnormal bleeding.  She denies any headaches, visual changes, or rashes.  She denies any dysphasia or odynophagia.  She denies any type of neuropathy or mucositis.  She's currently on her eighth cycle of treatment today.  She will also receive Zometa.  She's not reporting any symptoms of osteonecrosis of the jaw.  Review of Systems: Constitutional:Negative for malaise/fatigue, fever, chills, weight loss, diaphoresis, activity change, appetite change, and unexpected weight change.  HEENT: Negative for double vision, blurred vision, visual loss, ear pain, tinnitus, congestion, rhinorrhea, epistaxis sore throat or sinus disease, oral pain/lesion, tongue soreness Respiratory: Negative for cough, chest tightness, shortness of breath, wheezing and stridor.  Cardiovascular: Negative for chest pain, palpitations, leg swelling, orthopnea, PND, DOE or claudication Gastrointestinal: Negative for nausea, vomiting, abdominal pain, diarrhea,  constipation, blood in stool, melena, hematochezia, abdominal distention, anal bleeding, rectal pain, anorexia and hematemesis.  Genitourinary: Negative for dysuria, frequency, hematuria,  Musculoskeletal: Negative for myalgias, back pain, joint swelling, arthralgias and gait problem.  Skin: Negative for rash, color change, pallor and wound.  Neurological:. Negative for dizziness/light-headedness, tremors, seizures, syncope, facial asymmetry, speech difficulty, weakness, numbness, headaches and paresthesias.  Hematological: Negative for adenopathy. Does not bruise/bleed easily.  Psychiatric/Behavioral:  Negative for depression, no loss of interest in normal activity or change in sleep pattern.   Physical Exam: This is a 77 year old, petite, white female, in no obvious distress Vitals: Temperature 97.9 degrees, pulse 92, respirations 16, blood pressure 129/62.  Weight 123 pounds HEENT reveals a normocephalic, atraumatic skull, no scleral icterus, no oral lesions  Neck is supple without any cervical or supraclavicular adenopathy.  Lungs are clear to auscultation bilaterally. There are no wheezes, rales or rhonci Cardiac is regular rate and rhythm with a normal S1 and S2. There are no murmurs, rubs, or bruits.  Abdomen is soft with good bowel sounds, there is no palpable mass. There is no palpable hepatosplenomegaly. There is no palpable fluid wave.  Musculoskeletal no tenderness of the spine, ribs, or hips.  Extremities there are no clubbing, cyanosis, or edema.  Skin no petechia, purpura or ecchymosis Neurologic is nonfocal.  Laboratory Data: White count 9.7, hemoglobin 12.8, hematocrit 38.5.  Platelets 179,000   Radiography: PET SCAN 04/29/12  Impression:  , #1.  Overall, improvement, with significant reduction in size, and hypermetabolic activity of the hepatic metastatic lesions, and prominently reduced activity in conspicuityof the osseous metastatic lesions   Current Outpatient  Prescriptions on File Prior to Visit  Medication Sig Dispense Refill  .  aspirin 81 MG chewable tablet Chew 81 mg by mouth daily.        . Calcium Carbonate-Vit D-Min (CALCIUM 1200 PO) Take by mouth every morning.      . cholecalciferol (VITAMIN D) 1000 UNITS tablet Take 1,000 Units by mouth daily.        Marland Kitchen dexamethasone (DECADRON) 4 MG tablet Take 2 tablets by mouth daily starting the day after chemotherapy for 2 days. Take with food.  30 tablet  1  . Dextromethorphan-Guaifenesin (MUCINEX DM MAXIMUM STRENGTH) 60-1200 MG TB12 Take by mouth 2 (two) times daily.      [name] Sodium (COLACE PO) Take by mouth every morning. 3 po q day.      . fexofenadine (ALLEGRA) 180 MG tablet Take 180 mg by mouth daily.        . fluticasone (FLONASE) 50 MCG/ACT nasal spray Place 2 sprays into the nose daily.  16 g  3  . HYDROcodone-acetaminophen (NORCO) 10-325 MG per tablet 1 - 2 tabs by mouth every 6 hours as needed for pain secondary to cancer.  180 tablet  0  . lisinopril-hydrochlorothiazide (PRINZIDE,ZESTORETIC) 10-12.5 MG per tablet TAKE 1 TABLET BY MOUTH DAILY  90 tablet  0  . LORazepam (ATIVAN) 0.5 MG tablet 0.5 mg as needed.       . megestrol (MEGACE) 40 MG tablet Take 1 tablet (40 mg total) by mouth daily.  30 tablet  5  . omeprazole (PRILOSEC) 20 MG capsule TAKE 1 CAPSULE BY MOUTH EVERY DAY  30 capsule  2  . omeprazole (PRILOSEC) 20 MG capsule TAKE 1 CAPSULE BY MOUTH EVERY DAY  30 capsule  2  . ondansetron (ZOFRAN) 8 MG tablet as needed.      . prochlorperazine (COMPAZINE) 10 MG tablet Take 10 mg by mouth as needed.       Assessment/Plan: This is a pleasant, 77 year old, white female, with the following issues:  #1.  Metastatic breast cancer.  Overall, she is responding quite nicely.  Her CA 27.29. Continues to decrease.  She is also had improvement with her PET scan.  She will go ahead with treatment today.  #2.  Insomnia.  .  She remains on Restoril.    #3.  Supportive therapy.  She continues to  receive Zometa monthly.  #4.  Followup.  We will follow back up with Anna Benjamin in about a month, but before then should there be questions or concerns.

## 2012-08-19 LAB — COMPREHENSIVE METABOLIC PANEL
ALT: 19 U/L (ref 0–35)
Alkaline Phosphatase: 73 U/L (ref 39–117)
CO2: 22 mEq/L (ref 19–32)
Potassium: 3.9 mEq/L (ref 3.5–5.3)
Sodium: 139 mEq/L (ref 135–145)
Total Bilirubin: 0.4 mg/dL (ref 0.3–1.2)
Total Protein: 6 g/dL (ref 6.0–8.3)

## 2012-08-24 ENCOUNTER — Ambulatory Visit: Payer: Medicare Other

## 2012-08-24 ENCOUNTER — Other Ambulatory Visit: Payer: Medicare Other | Admitting: Lab

## 2012-08-26 ENCOUNTER — Other Ambulatory Visit: Payer: Self-pay | Admitting: *Deleted

## 2012-08-26 DIAGNOSIS — C50919 Malignant neoplasm of unspecified site of unspecified female breast: Secondary | ICD-10-CM

## 2012-08-26 DIAGNOSIS — F419 Anxiety disorder, unspecified: Secondary | ICD-10-CM

## 2012-08-26 MED ORDER — HYDROCODONE-ACETAMINOPHEN 10-325 MG PO TABS: ORAL_TABLET | ORAL | Status: DC

## 2012-08-26 NOTE — Telephone Encounter (Signed)
Received refill request from [facility] for hydrocodone/apap 10-325. This is a chronic med for the pt. Called in to after approval  from [place], [place].

## 2012-08-27 ENCOUNTER — Other Ambulatory Visit: Payer: Self-pay | Admitting: Oncology

## 2012-08-27 ENCOUNTER — Ambulatory Visit (HOSPITAL_BASED_OUTPATIENT_CLINIC_OR_DEPARTMENT_OTHER): Payer: Medicare Other

## 2012-08-27 ENCOUNTER — Other Ambulatory Visit (HOSPITAL_BASED_OUTPATIENT_CLINIC_OR_DEPARTMENT_OTHER): Payer: Medicare Other | Admitting: Lab

## 2012-08-27 VITALS — BP 115/69 | HR 74 | Temp 96.8°F | Resp 20

## 2012-08-27 DIAGNOSIS — C50919 Malignant neoplasm of unspecified site of unspecified female breast: Secondary | ICD-10-CM

## 2012-08-27 DIAGNOSIS — Z5111 Encounter for antineoplastic chemotherapy: Secondary | ICD-10-CM

## 2012-08-27 DIAGNOSIS — C50119 Malignant neoplasm of central portion of unspecified female breast: Secondary | ICD-10-CM

## 2012-08-27 DIAGNOSIS — C787 Secondary malignant neoplasm of liver and intrahepatic bile duct: Secondary | ICD-10-CM

## 2012-08-27 LAB — CBC WITH DIFFERENTIAL (CANCER CENTER ONLY)
BASO%: 0.4 % (ref 0.0–2.0)
Eosinophils Absolute: 0 10*3/uL (ref 0.0–0.5)
LYMPH#: 3 10*3/uL (ref 0.9–3.3)
LYMPH%: 36.3 % (ref 14.0–48.0)
MCV: 99 fL (ref 81–101)
MONO#: 0.7 10*3/uL (ref 0.1–0.9)
NEUT#: 4.5 10*3/uL (ref 1.5–6.5)
Platelets: 205 10*3/uL (ref 145–400)
RBC: 3.88 10*6/uL (ref 3.70–5.32)
RDW: 12.4 % (ref 11.1–15.7)
WBC: 8.2 10*3/uL (ref 3.9–10.0)

## 2012-08-27 MED ORDER — DEXAMETHASONE SODIUM PHOSPHATE 10 MG/ML IJ SOLN
10.0000 mg | Freq: Once | INTRAMUSCULAR | Status: AC
  Administered 2012-08-27: 10 mg via INTRAVENOUS

## 2012-08-27 MED ORDER — PACLITAXEL PROTEIN-BOUND CHEMO INJECTION 100 MG
100.0000 mg/m2 | Freq: Once | INTRAVENOUS | Status: AC
  Administered 2012-08-27: 150 mg via INTRAVENOUS
  Filled 2012-08-27: qty 30

## 2012-08-27 MED ORDER — ONDANSETRON 8 MG/50ML IVPB (CHCC)
8.0000 mg | Freq: Once | INTRAVENOUS | Status: AC
  Administered 2012-08-27: 8 mg via INTRAVENOUS

## 2012-08-27 MED ORDER — SODIUM CHLORIDE 0.9 % IV SOLN
Freq: Once | INTRAVENOUS | Status: AC
  Administered 2012-08-27: 13:00:00 via INTRAVENOUS

## 2012-08-27 NOTE — Patient Instructions (Addendum)

## 2012-08-28 ENCOUNTER — Encounter: Payer: Self-pay | Admitting: Hematology & Oncology

## 2012-08-28 ENCOUNTER — Other Ambulatory Visit: Payer: Self-pay | Admitting: Medical

## 2012-08-28 DIAGNOSIS — C50919 Malignant neoplasm of unspecified site of unspecified female breast: Secondary | ICD-10-CM

## 2012-08-28 LAB — COMPREHENSIVE METABOLIC PANEL
ALT: 19 U/L (ref 0–35)
Albumin: 4.2 g/dL (ref 3.5–5.2)
CO2: 24 mEq/L (ref 19–32)
Calcium: 9.4 mg/dL (ref 8.4–10.5)
Chloride: 104 mEq/L (ref 96–112)
Glucose, Bld: 113 mg/dL — ABNORMAL HIGH (ref 70–99)
Potassium: 3.8 mEq/L (ref 3.5–5.3)
Sodium: 139 mEq/L (ref 135–145)
Total Bilirubin: 0.5 mg/dL (ref 0.3–1.2)
Total Protein: 6 g/dL (ref 6.0–8.3)

## 2012-08-28 LAB — CANCER ANTIGEN 27.29: CA 27.29: 603 U/mL — ABNORMAL HIGH (ref 0–39)

## 2012-08-31 ENCOUNTER — Ambulatory Visit: Payer: Medicare Other

## 2012-08-31 ENCOUNTER — Other Ambulatory Visit (HOSPITAL_BASED_OUTPATIENT_CLINIC_OR_DEPARTMENT_OTHER): Payer: Medicare Other | Admitting: Lab

## 2012-08-31 DIAGNOSIS — C50919 Malignant neoplasm of unspecified site of unspecified female breast: Secondary | ICD-10-CM

## 2012-08-31 DIAGNOSIS — C50119 Malignant neoplasm of central portion of unspecified female breast: Secondary | ICD-10-CM

## 2012-08-31 LAB — CBC WITH DIFFERENTIAL (CANCER CENTER ONLY)
BASO%: 0.3 % (ref 0.0–2.0)
EOS%: 0.5 % (ref 0.0–7.0)
LYMPH#: 2.2 10*3/uL (ref 0.9–3.3)
MCHC: 33.1 g/dL (ref 32.0–36.0)
MONO#: 0.2 10*3/uL (ref 0.1–0.9)
NEUT#: 3.2 10*3/uL (ref 1.5–6.5)
RDW: 12.1 % (ref 11.1–15.7)
WBC: 5.8 10*3/uL (ref 3.9–10.0)

## 2012-08-31 MED ORDER — DEXAMETHASONE SODIUM PHOSPHATE 10 MG/ML IJ SOLN: 10.0000 mg | Freq: Once | INTRAMUSCULAR | Status: DC

## 2012-08-31 MED ORDER — ONDANSETRON 8 MG/50ML IVPB (CHCC): 8.0000 mg | Freq: Once | INTRAVENOUS | Status: DC

## 2012-08-31 MED ORDER — SODIUM CHLORIDE 0.9 % IV SOLN: Freq: Once | INTRAVENOUS | Status: DC

## 2012-08-31 MED ORDER — PACLITAXEL PROTEIN-BOUND CHEMO INJECTION 100 MG: 100.0000 mg/m2 | Freq: Once | INTRAVENOUS | Status: DC

## 2012-08-31 NOTE — Progress Notes (Signed)
Pt did not get treatment today. Last got chemo on 08/27/12.  To soon to get another chemo treatment.

## 2012-09-04 ENCOUNTER — Ambulatory Visit (HOSPITAL_BASED_OUTPATIENT_CLINIC_OR_DEPARTMENT_OTHER): Payer: Medicare Other

## 2012-09-04 VITALS — BP 104/59 | HR 77 | Temp 97.4°F | Resp 20

## 2012-09-04 DIAGNOSIS — C50119 Malignant neoplasm of central portion of unspecified female breast: Secondary | ICD-10-CM

## 2012-09-04 DIAGNOSIS — C50919 Malignant neoplasm of unspecified site of unspecified female breast: Secondary | ICD-10-CM

## 2012-09-04 DIAGNOSIS — Z5111 Encounter for antineoplastic chemotherapy: Secondary | ICD-10-CM

## 2012-09-04 DIAGNOSIS — C787 Secondary malignant neoplasm of liver and intrahepatic bile duct: Secondary | ICD-10-CM

## 2012-09-04 MED ORDER — DEXAMETHASONE SODIUM PHOSPHATE 10 MG/ML IJ SOLN
10.0000 mg | Freq: Once | INTRAMUSCULAR | Status: AC
  Administered 2012-09-04: 10 mg via INTRAVENOUS

## 2012-09-04 MED ORDER — ONDANSETRON 8 MG/50ML IVPB (CHCC)
8.0000 mg | Freq: Once | INTRAVENOUS | Status: AC
  Administered 2012-09-04: 8 mg via INTRAVENOUS

## 2012-09-04 MED ORDER — PACLITAXEL PROTEIN-BOUND CHEMO INJECTION 100 MG
100.0000 mg/m2 | Freq: Once | INTRAVENOUS | Status: AC
  Administered 2012-09-04: 150 mg via INTRAVENOUS
  Filled 2012-09-04: qty 30

## 2012-09-04 MED ORDER — SODIUM CHLORIDE 0.9 % IV SOLN
Freq: Once | INTRAVENOUS | Status: AC
  Administered 2012-09-04: 13:00:00 via INTRAVENOUS

## 2012-09-04 NOTE — Patient Instructions (Signed)

## 2012-09-11 ENCOUNTER — Other Ambulatory Visit: Payer: Self-pay | Admitting: Medical

## 2012-09-11 DIAGNOSIS — C50919 Malignant neoplasm of unspecified site of unspecified female breast: Secondary | ICD-10-CM

## 2012-09-14 ENCOUNTER — Ambulatory Visit (HOSPITAL_BASED_OUTPATIENT_CLINIC_OR_DEPARTMENT_OTHER): Payer: Medicare Other | Admitting: Medical

## 2012-09-14 ENCOUNTER — Ambulatory Visit (HOSPITAL_BASED_OUTPATIENT_CLINIC_OR_DEPARTMENT_OTHER): Payer: Medicare Other

## 2012-09-14 ENCOUNTER — Other Ambulatory Visit (HOSPITAL_BASED_OUTPATIENT_CLINIC_OR_DEPARTMENT_OTHER): Payer: Medicare Other | Admitting: Lab

## 2012-09-14 ENCOUNTER — Other Ambulatory Visit: Payer: Self-pay | Admitting: Family Medicine

## 2012-09-14 VITALS — BP 132/64 | HR 80 | Temp 97.7°F | Resp 16 | Ht 59.0 in | Wt 124.0 lb

## 2012-09-14 DIAGNOSIS — C787 Secondary malignant neoplasm of liver and intrahepatic bile duct: Secondary | ICD-10-CM

## 2012-09-14 DIAGNOSIS — C50919 Malignant neoplasm of unspecified site of unspecified female breast: Secondary | ICD-10-CM

## 2012-09-14 DIAGNOSIS — Z5111 Encounter for antineoplastic chemotherapy: Secondary | ICD-10-CM

## 2012-09-14 DIAGNOSIS — C7951 Secondary malignant neoplasm of bone: Secondary | ICD-10-CM

## 2012-09-14 DIAGNOSIS — C50119 Malignant neoplasm of central portion of unspecified female breast: Secondary | ICD-10-CM

## 2012-09-14 DIAGNOSIS — G4700 Insomnia, unspecified: Secondary | ICD-10-CM

## 2012-09-14 LAB — CBC WITH DIFFERENTIAL (CANCER CENTER ONLY)
BASO#: 0 10*3/uL (ref 0.0–0.2)
BASO%: 0.4 % (ref 0.0–2.0)
EOS%: 0.3 % (ref 0.0–7.0)
HCT: 36.9 % (ref 34.8–46.6)
HGB: 12.2 g/dL (ref 11.6–15.9)
LYMPH#: 2.7 10*3/uL (ref 0.9–3.3)
LYMPH%: 38.2 % (ref 14.0–48.0)
MCH: 32.5 pg (ref 26.0–34.0)
MCHC: 33.1 g/dL (ref 32.0–36.0)
MONO%: 7.8 % (ref 0.0–13.0)
NEUT%: 53.3 % (ref 39.6–80.0)
RDW: 12.7 % (ref 11.1–15.7)

## 2012-09-14 LAB — COMPREHENSIVE METABOLIC PANEL
BUN: 19 mg/dL (ref 6–23)
CO2: 24 mEq/L (ref 19–32)
Creatinine, Ser: 0.83 mg/dL (ref 0.50–1.10)
Glucose, Bld: 116 mg/dL — ABNORMAL HIGH (ref 70–99)
Total Bilirubin: 0.3 mg/dL (ref 0.3–1.2)

## 2012-09-14 MED ORDER — SODIUM CHLORIDE 0.9 % IV SOLN
Freq: Once | INTRAVENOUS | Status: AC
  Administered 2012-09-14: 15:00:00 via INTRAVENOUS

## 2012-09-14 MED ORDER — ZOLEDRONIC ACID 4 MG/100ML IV SOLN
4.0000 mg | Freq: Once | INTRAVENOUS | Status: AC
  Administered 2012-09-14: 4 mg via INTRAVENOUS
  Filled 2012-09-14: qty 100

## 2012-09-14 MED ORDER — PACLITAXEL PROTEIN-BOUND CHEMO INJECTION 100 MG
100.0000 mg/m2 | Freq: Once | INTRAVENOUS | Status: AC
  Administered 2012-09-14: 150 mg via INTRAVENOUS
  Filled 2012-09-14: qty 30

## 2012-09-14 MED ORDER — DEXAMETHASONE SODIUM PHOSPHATE 10 MG/ML IJ SOLN
10.0000 mg | Freq: Once | INTRAMUSCULAR | Status: AC
  Administered 2012-09-14: 10 mg via INTRAVENOUS

## 2012-09-14 MED ORDER — ONDANSETRON 8 MG/50ML IVPB (CHCC)
8.0000 mg | Freq: Once | INTRAVENOUS | Status: AC
  Administered 2012-09-14: 8 mg via INTRAVENOUS

## 2012-09-14 NOTE — Progress Notes (Signed)
Diagnosis: Metastatic breast cancer.  Current therapy: #1.  Status post 8 cycles of Abraxane (3 weeks on/one-week off). #2.  Zometa 4 mg IV monthly.  Interim history: , [name] presents today for an office followup visit.  Overall, she is doing quite well.  She is tolerating her treatment very nicely.  She is extremely functional.  Her last PET scan back on November 20 showed an overall, improvement with significant reduction in size, and hypermetabolic activity of the hepatic metastatic lesions, and prominently reduced, activity of the osseous metastatic lesions.  We still continue to monitor her CA 27.29.  Back on March 20th it was 603.  She otherwise reports, that she has an excellent appetite.  She denies any nausea, vomiting, diarrhea, constipation, chest pain, shortness of breath, or cough.  She denies any fevers, chills, or night sweats.  She denies any lower leg swelling.  She denies any abdominal pain.  She denies any obvious, or abnormal bleeding.  She denies any headaches, visual changes, or rashes.  She denies any dysphasia or odynophagia.  She denies any type of neuropathy or mucositis.  She will go ahead with her ninth cycle of Abraxane today.  She will also receive Zometa.  She's not reporting any symptoms of osteonecrosis of the jaw.  Review of Systems: Constitutional:Negative for malaise/fatigue, fever, chills, weight loss, diaphoresis, activity change, appetite change, and unexpected weight change.  HEENT: Negative for double vision, blurred vision, visual loss, ear pain, tinnitus, congestion, rhinorrhea, epistaxis sore throat or sinus disease, oral pain/lesion, tongue soreness Respiratory: Negative for cough, chest tightness, shortness of breath, wheezing and stridor.  Cardiovascular: Negative for chest pain, palpitations, leg swelling, orthopnea, PND, DOE or claudication Gastrointestinal: Negative for nausea, vomiting, abdominal pain, diarrhea, constipation, blood in stool,  melena, hematochezia, abdominal distention, anal bleeding, rectal pain, anorexia and hematemesis.  Genitourinary: Negative for dysuria, frequency, hematuria,  Musculoskeletal: Negative for myalgias, back pain, joint swelling, arthralgias and gait problem.  Skin: Negative for rash, color change, pallor and wound.  Neurological:. Negative for dizziness/light-headedness, tremors, seizures, syncope, facial asymmetry, speech difficulty, weakness, numbness, headaches and paresthesias.  Hematological: Negative for adenopathy. Does not bruise/bleed easily.  Psychiatric/Behavioral:  Negative for depression, no loss of interest in normal activity or change in sleep pattern.   Physical Exam: This is a 77 year old, petite, white female, in no obvious distress Vitals: Temperature 97.7 degrees, pulse 80, respirations 16, blood pressure 132/54.  Weight 124 pounds HEENT reveals a normocephalic, atraumatic skull, no scleral icterus, no oral lesions  Neck is supple without any cervical or supraclavicular adenopathy.  Lungs are clear to auscultation bilaterally. There are no wheezes, rales or rhonci Cardiac is regular rate and rhythm with a normal S1 and S2. There are no murmurs, rubs, or bruits.  Abdomen is soft with good bowel sounds, there is no palpable mass. There is no palpable hepatosplenomegaly. There is no palpable fluid wave.  Musculoskeletal no tenderness of the spine, ribs, or hips.  Extremities there are no clubbing, cyanosis, or edema.  Skin no petechia, purpura or ecchymosis Neurologic is nonfocal.  Laboratory Data: White count 7.1, hemoglobin 12.2, hematocrit 36.9, platelets 196,000   Radiography: PET SCAN 04/29/12  Impression:  , #1.  Overall, improvement, with significant reduction in size, and hypermetabolic activity of the hepatic metastatic lesions, and prominently reduced activity in conspicuityof the osseous metastatic lesions   Current Outpatient Prescriptions on File Prior to Visit   Medication Sig Dispense Refill  . aspirin 81 MG chewable tablet Chew  81 mg by mouth daily.        . Calcium Carbonate-Vit D-Min (CALCIUM 1200 PO) Take by mouth every morning.      . cholecalciferol (VITAMIN D) 1000 UNITS tablet Take 1,000 Units by mouth daily.        Marland Kitchen dexamethasone (DECADRON) 4 MG tablet Take 2 tablets by mouth daily starting the day after chemotherapy for 2 days. Take with food.  30 tablet  1  . Dextromethorphan-Guaifenesin (MUCINEX DM MAXIMUM STRENGTH) 60-1200 MG TB12 Take by mouth 2 (two) times daily.      [name] Sodium (COLACE PO) Take by mouth every morning. 3 po q day.      . fexofenadine (ALLEGRA) 180 MG tablet Take 180 mg by mouth daily.        . fluticasone (FLONASE) 50 MCG/ACT nasal spray Place 2 sprays into the nose daily.  16 g  3  . HYDROcodone-acetaminophen (NORCO) 10-325 MG per tablet 1 - 2 tabs by mouth every 6 hours as needed for pain secondary to cancer.  180 tablet  0  . lisinopril-hydrochlorothiazide (PRINZIDE,ZESTORETIC) 10-12.5 MG per tablet TAKE 1 TABLET BY MOUTH DAILY  90 tablet  0  . LORazepam (ATIVAN) 0.5 MG tablet 0.5 mg as needed.       . megestrol (MEGACE) 40 MG tablet Take 1 tablet (40 mg total) by mouth daily.  30 tablet  5  . omeprazole (PRILOSEC) 20 MG capsule TAKE 1 CAPSULE BY MOUTH EVERY DAY  30 capsule  2  . omeprazole (PRILOSEC) 20 MG capsule TAKE 1 CAPSULE BY MOUTH EVERY DAY  30 capsule  2  . ondansetron (ZOFRAN) 8 MG tablet as needed.      . prochlorperazine (COMPAZINE) 10 MG tablet Take 10 mg by mouth as needed.       Assessment/Plan: This is a pleasant, 77 year old, white female, with the following issues:  #1.  Metastatic breast cancer.  Overall, she is responding quite nicely.   She is  had improvement with her PET scan.  She will go ahead with treatment today.  #2.  Insomnia.  .  She remains on Restoril.    #3.  Supportive therapy.  She continues to receive Zometa monthly.  #4.  Followup.  We will follow back up with Ms.  Anna Benjamin in about a month, but before then should there be questions or concerns.

## 2012-09-14 NOTE — Patient Instructions (Signed)
Zoledronic Acid injection (Hypercalcemia, Oncology) What is this medicine? ZOLEDRONIC ACID (ZOE le dron ik AS id) lowers the amount of calcium loss from bone. It is used to treat too much calcium in your blood from cancer. It is also used to prevent complications of cancer that has spread to the bone. This medicine may be used for other purposes; ask your health care provider or pharmacist if you have questions. What should I tell my health care provider before I take this medicine? They need to know if you have any of these conditions: -aspirin-sensitive asthma -dental disease -kidney disease -an unusual or allergic reaction to zoledronic acid, other medicines, foods, dyes, or preservatives -pregnant or trying to get pregnant -breast-feeding How should I use this medicine? This medicine is for infusion into a vein. It is given by a health care professional in a hospital or clinic setting. Talk to your pediatrician regarding the use of this medicine in children. Special care may be needed. Overdosage: If you think you have taken too much of this medicine contact a poison control center or emergency room at once. NOTE: This medicine is only for you. Do not share this medicine with others. What if I miss a dose? It is important not to miss your dose. Call your doctor or health care professional if you are unable to keep an appointment. What may interact with this medicine? -certain antibiotics given by injection -NSAIDs, medicines for pain and inflammation, like ibuprofen or naproxen -some diuretics like bumetanide, furosemide -teriparatide -thalidomide This list may not describe all possible interactions. Give your health care provider a list of all the medicines, herbs, non-prescription drugs, or dietary supplements you use. Also tell them if you smoke, drink alcohol, or use illegal drugs. Some items may interact with your medicine. What should I watch for while using this medicine? Visit  your doctor or health care professional for regular checkups. It may be some time before you see the benefit from this medicine. Do not stop taking your medicine unless your doctor tells you to. Your doctor may order blood tests or other tests to see how you are doing. Women should inform their doctor if they wish to become pregnant or think they might be pregnant. There is a potential for serious side effects to an unborn child. Talk to your health care professional or pharmacist for more information. You should make sure that you get enough calcium and vitamin D while you are taking this medicine. Discuss the foods you eat and the vitamins you take with your health care professional. Some people who take this medicine have severe bone, joint, and/or muscle pain. This medicine may also increase your risk for a broken thigh bone. Tell your doctor right away if you have pain in your upper leg or groin. Tell your doctor if you have any pain that does not go away or that gets worse. What side effects may I notice from receiving this medicine? Side effects that you should report to your doctor or health care professional as soon as possible: -allergic reactions like skin rash, itching or hives, swelling of the face, lips, or tongue -anxiety, confusion, or depression -breathing problems -changes in vision -feeling faint or lightheaded, falls -jaw burning, cramping, pain -muscle cramps, stiffness, or weakness -trouble passing urine or change in the amount of urine Side effects that usually do not require medical attention (report to your doctor or health care professional if they continue or are bothersome): -bone, joint, or muscle pain -  fever -hair loss -irritation at site where injected -loss of appetite -nausea, vomiting -stomach upset -tired This list may not describe all possible side effects. Call your doctor for medical advice about side effects. You may report side effects to FDA at  1-800-FDA-1088. Where should I keep my medicine? This drug is given in a hospital or clinic and will not be stored at home. NOTE: This sheet is a summary. It may not cover all possible information. If you have questions about this medicine, talk to your doctor, pharmacist, or health care provider.  2013, [employer]. (11/23/2010 9:06:58 AM) Nanoparticle Albumin-Bound Paclitaxel injection What is this medicine? NANOPARTICLE ALBUMIN-BOUND PACLITAXEL (Na no PAHR ti kuhl al BYOO muhn-bound PAK li TAX el) is a chemotherapy drug. It targets fast dividing cells, like cancer cells, and causes these cells to die. This medicine is used to treat advanced breast cancer and advanced lung cancer. This medicine may be used for other purposes; ask your health care provider or pharmacist if you have questions. What should I tell my health care provider before I take this medicine? They need to know if you have any of these conditions: -kidney disease -liver disease -low blood counts, like low platelets, red blood cells, or white blood cells -recent or ongoing radiation therapy -an unusual or allergic reaction to paclitaxel, albumin, other chemotherapy, other medicines, foods, dyes, or preservatives -pregnant or trying to get pregnant -breast-feeding How should I use this medicine? This drug is given as an infusion into a vein. It is administered in a hospital or clinic by a specially trained health care professional. Talk to your pediatrician regarding the use of this medicine in children. Special care may be needed. Overdosage: If you think you have taken too much of this medicine contact a poison control center or emergency room at once. NOTE: This medicine is only for you. Do not share this medicine with others. What if I miss a dose? It is important not to miss your dose. Call your doctor or health care professional if you are unable to keep an appointment. What may interact with this  medicine? This medicine may also interact with the following medications: -cyclosporine -dexamethasone -diazepam -ketoconazole -medicines to increase blood counts like filgrastim, pegfilgrastim, sargramostim -other chemotherapy drugs like cisplatin, doxorubicin, epirubicin, etoposide, teniposide, vincristine -quinidine -testosterone -vaccines -verapamil Talk to your doctor or health care professional before taking any of these medicines: -acetaminophen -aspirin -ibuprofen -ketoprofen -naproxen This list may not describe all possible interactions. Give your health care provider a list of all the medicines, herbs, non-prescription drugs, or dietary supplements you use. Also tell them if you smoke, drink alcohol, or use illegal drugs. Some items may interact with your medicine. What should I watch for while using this medicine? Your condition will be monitored carefully while you are receiving this medicine. You will need important blood work done while you are taking this medicine. This drug may make you feel generally unwell. This is not uncommon, as chemotherapy can affect healthy cells as well as cancer cells. Report any side effects. Continue your course of treatment even though you feel ill unless your doctor tells you to stop. In some cases, you may be given additional medicines to help with side effects. Follow all directions for their use. Call your doctor or health care professional for advice if you get a fever, chills or sore throat, or other symptoms of a cold or flu. Do not treat yourself. This drug decreases your body's ability to  fight infections. Try to avoid being around people who are sick. This medicine may increase your risk to bruise or bleed. Call your doctor or health care professional if you notice any unusual bleeding. Be careful brushing and flossing your teeth or using a toothpick because you may get an infection or bleed more easily. If you have any dental work  done, tell your dentist you are receiving this medicine. Avoid taking products that contain aspirin, acetaminophen, ibuprofen, naproxen, or ketoprofen unless instructed by your doctor. These medicines may hide a fever. Do not become pregnant while taking this medicine. Women should inform their doctor if they wish to become pregnant or think they might be pregnant. There is a potential for serious side effects to an unborn child. Talk to your health care professional or pharmacist for more information. Do not breast-feed an infant while taking this medicine. Men are advised not to father a child while receiving this medicine. What side effects may I notice from receiving this medicine? Side effects that you should report to your doctor or health care professional as soon as possible: -allergic reactions like skin rash, itching or hives, swelling of the face, lips, or tongue -low blood counts - This drug may decrease the number of white blood cells, red blood cells and platelets. You may be at increased risk for infections and bleeding. -signs of infection - fever or chills, cough, sore throat, pain or difficulty passing urine -signs of decreased platelets or bleeding - bruising, pinpoint red spots on the skin, black, tarry stools, nosebleeds -signs of decreased red blood cells - unusually weak or tired, fainting spells, lightheadedness -breathing problems -changes in vision -chest pain -high or low blood pressure -mouth sores -nausea and vomiting -pain, swelling, redness or irritation at the injection site -pain, tingling, numbness in the hands or feet -slow or irregular heartbeat -swelling of the ankle, feet, hands Side effects that usually do not require medical attention (report to your doctor or health care professional if they continue or are bothersome): -aches, pains -changes in the color of fingernails -diarrhea -hair loss -loss of appetite This list may not describe all possible  side effects. Call your doctor for medical advice about side effects. You may report side effects to FDA at 1-800-FDA-1088. Where should I keep my medicine? This drug is given in a hospital or clinic and will not be stored at home. NOTE: This sheet is a summary. It may not cover all possible information. If you have questions about this medicine, talk to your doctor, pharmacist, or health care provider.  2013, [employer] Standard. (04/01/2011 4:13:49 PM)

## 2012-09-21 ENCOUNTER — Ambulatory Visit: Payer: Medicare Other

## 2012-09-21 ENCOUNTER — Ambulatory Visit (HOSPITAL_BASED_OUTPATIENT_CLINIC_OR_DEPARTMENT_OTHER): Payer: Medicare Other

## 2012-09-21 ENCOUNTER — Other Ambulatory Visit: Payer: Medicare Other | Admitting: Lab

## 2012-09-21 VITALS — BP 114/62 | HR 82 | Temp 97.3°F

## 2012-09-21 DIAGNOSIS — C787 Secondary malignant neoplasm of liver and intrahepatic bile duct: Secondary | ICD-10-CM

## 2012-09-21 DIAGNOSIS — C50119 Malignant neoplasm of central portion of unspecified female breast: Secondary | ICD-10-CM

## 2012-09-21 DIAGNOSIS — C50919 Malignant neoplasm of unspecified site of unspecified female breast: Secondary | ICD-10-CM

## 2012-09-21 DIAGNOSIS — Z5111 Encounter for antineoplastic chemotherapy: Secondary | ICD-10-CM

## 2012-09-21 DIAGNOSIS — C7952 Secondary malignant neoplasm of bone marrow: Secondary | ICD-10-CM

## 2012-09-21 MED ORDER — ONDANSETRON 8 MG/50ML IVPB (CHCC)
8.0000 mg | Freq: Once | INTRAVENOUS | Status: AC
Start: 2012-09-21 — End: 2012-09-21
  Administered 2012-09-21: 8 mg via INTRAVENOUS

## 2012-09-21 MED ORDER — SODIUM CHLORIDE 0.9 % IV SOLN
Freq: Once | INTRAVENOUS | Status: AC
  Administered 2012-09-21: 13:00:00 via INTRAVENOUS

## 2012-09-21 MED ORDER — PACLITAXEL PROTEIN-BOUND CHEMO INJECTION 100 MG
100.0000 mg/m2 | Freq: Once | INTRAVENOUS | Status: AC
  Administered 2012-09-21: 150 mg via INTRAVENOUS
  Filled 2012-09-21: qty 30

## 2012-09-21 MED ORDER — DEXAMETHASONE SODIUM PHOSPHATE 10 MG/ML IJ SOLN
10.0000 mg | Freq: Once | INTRAMUSCULAR | Status: AC
  Administered 2012-09-21: 10 mg via INTRAVENOUS

## 2012-09-21 NOTE — Patient Instructions (Addendum)
[facility] Discharge Instructions for Patients Receiving Chemotherapy  Today you received the following chemotherapy agents Abraxane To help prevent nausea and vomiting after your treatment, we encourage you to take your nausea medication as prescribed. If you develop nausea and vomiting that is not controlled by your nausea medication, call the clinic. If it is after clinic hours your family physician or the after hours number for the clinic or go to the Emergency Department.   BELOW ARE SYMPTOMS THAT SHOULD BE REPORTED IMMEDIATELY:  *FEVER GREATER THAN 100.5 F  *CHILLS WITH OR WITHOUT FEVER  NAUSEA AND VOMITING THAT IS NOT CONTROLLED WITH YOUR NAUSEA MEDICATION  *UNUSUAL SHORTNESS OF BREATH  *UNUSUAL BRUISING OR BLEEDING  TENDERNESS IN MOUTH AND THROAT WITH OR WITHOUT PRESENCE OF ULCERS  *URINARY PROBLEMS  *BOWEL PROBLEMS  UNUSUAL RASH Items with * indicate a potential emergency and should be followed up as soon as possible.  One of the nurses will contact you 24 hours after your treatment. Please let the nurse know about any problems that you may have experienced. Feel free to call the clinic you have any questions or concerns. The clinic phone number is [phone].   I have been informed and understand all the instructions given to me. I know to contact the clinic, my physician, or go to the Emergency Department if any problems should occur. I do not have any questions at this time, but understand that I may call the clinic during office hours   should I have any questions or need assistance in obtaining follow up care.    __________________________________________  _____________  __________ Signature of Patient or Authorized Representative            Date                   Time    __________________________________________ Nurse's Signature    

## 2012-09-21 NOTE — Progress Notes (Signed)
No labwork needed per dr. Myna Hidalgo

## 2012-09-28 ENCOUNTER — Other Ambulatory Visit: Payer: Medicare Other | Admitting: Lab

## 2012-09-28 ENCOUNTER — Ambulatory Visit (HOSPITAL_BASED_OUTPATIENT_CLINIC_OR_DEPARTMENT_OTHER): Payer: Medicare Other

## 2012-09-28 VITALS — BP 104/58 | HR 90 | Temp 98.6°F

## 2012-09-28 DIAGNOSIS — C7951 Secondary malignant neoplasm of bone: Secondary | ICD-10-CM

## 2012-09-28 DIAGNOSIS — C50119 Malignant neoplasm of central portion of unspecified female breast: Secondary | ICD-10-CM

## 2012-09-28 DIAGNOSIS — Z5111 Encounter for antineoplastic chemotherapy: Secondary | ICD-10-CM

## 2012-09-28 DIAGNOSIS — C787 Secondary malignant neoplasm of liver and intrahepatic bile duct: Secondary | ICD-10-CM

## 2012-09-28 DIAGNOSIS — C50919 Malignant neoplasm of unspecified site of unspecified female breast: Secondary | ICD-10-CM

## 2012-09-28 LAB — CBC WITH DIFFERENTIAL (CANCER CENTER ONLY)
BASO#: 0 10*3/uL (ref 0.0–0.2)
BASO%: 0.3 % (ref 0.0–2.0)
EOS%: 0.3 % (ref 0.0–7.0)
Eosinophils Absolute: 0 10*3/uL (ref 0.0–0.5)
HCT: 35.6 % (ref 34.8–46.6)
HGB: 11.8 g/dL (ref 11.6–15.9)
LYMPH%: 47.5 % (ref 14.0–48.0)
MCH: 32.6 pg (ref 26.0–34.0)
MCV: 98 fL (ref 81–101)
MONO%: 5.7 % (ref 0.0–13.0)
NEUT%: 46.2 % (ref 39.6–80.0)
RBC: 3.62 10*6/uL — ABNORMAL LOW (ref 3.70–5.32)

## 2012-09-28 MED ORDER — PACLITAXEL PROTEIN-BOUND CHEMO INJECTION 100 MG
100.0000 mg/m2 | Freq: Once | INTRAVENOUS | Status: AC
  Administered 2012-09-28: 150 mg via INTRAVENOUS
  Filled 2012-09-28: qty 30

## 2012-09-28 MED ORDER — DEXAMETHASONE SODIUM PHOSPHATE 10 MG/ML IJ SOLN
10.0000 mg | Freq: Once | INTRAMUSCULAR | Status: AC
  Administered 2012-09-28: 10 mg via INTRAVENOUS

## 2012-09-28 MED ORDER — SODIUM CHLORIDE 0.9 % IV SOLN
Freq: Once | INTRAVENOUS | Status: AC
  Administered 2012-09-28: 13:00:00 via INTRAVENOUS

## 2012-09-28 MED ORDER — ONDANSETRON 8 MG/50ML IVPB (CHCC)
8.0000 mg | Freq: Once | INTRAVENOUS | Status: AC
  Administered 2012-09-28: 8 mg via INTRAVENOUS

## 2012-09-28 NOTE — Patient Instructions (Addendum)
[facility] Discharge Instructions for Patients Receiving Chemotherapy  Today you received the following chemotherapy agents Abraxane  To help prevent nausea and vomiting after your treatment, we encourage you to take your nausea medication as prescribed  If you develop nausea and vomiting that is not controlled by your nausea medication, call the clinic. If it is after clinic hours your family physician or the after hours number for the clinic or go to the Emergency Department.   BELOW ARE SYMPTOMS THAT SHOULD BE REPORTED IMMEDIATELY:  *FEVER GREATER THAN 100.5 F  *CHILLS WITH OR WITHOUT FEVER  NAUSEA AND VOMITING THAT IS NOT CONTROLLED WITH YOUR NAUSEA MEDICATION  *UNUSUAL SHORTNESS OF BREATH  *UNUSUAL BRUISING OR BLEEDING  TENDERNESS IN MOUTH AND THROAT WITH OR WITHOUT PRESENCE OF ULCERS  *URINARY PROBLEMS  *BOWEL PROBLEMS  UNUSUAL RASH Items with * indicate a potential emergency and should be followed up as soon as possible.  One of the nurses will contact you 24 hours after your treatment. Please let the nurse know about any problems that you may have experienced. Feel free to call the clinic you have any questions or concerns. The clinic phone number is [phone].   I have been informed and understand all the instructions given to me. I know to contact the clinic, my physician, or go to the Emergency Department if any problems should occur. I do not have any questions at this time, but understand that I may call the clinic during office hours   should I have any questions or need assistance in obtaining follow up care.    __________________________________________  _____________  __________ Signature of Patient or Authorized Representative            Date                   Time    __________________________________________ Nurse's Signature

## 2012-10-12 ENCOUNTER — Other Ambulatory Visit (HOSPITAL_BASED_OUTPATIENT_CLINIC_OR_DEPARTMENT_OTHER): Payer: Medicare Other | Admitting: Lab

## 2012-10-12 ENCOUNTER — Ambulatory Visit (HOSPITAL_BASED_OUTPATIENT_CLINIC_OR_DEPARTMENT_OTHER): Payer: Medicare Other

## 2012-10-12 ENCOUNTER — Ambulatory Visit (HOSPITAL_BASED_OUTPATIENT_CLINIC_OR_DEPARTMENT_OTHER): Payer: Medicare Other | Admitting: Hematology & Oncology

## 2012-10-12 VITALS — BP 129/79 | HR 83 | Temp 97.4°F | Resp 16 | Ht 59.0 in | Wt 123.0 lb

## 2012-10-12 DIAGNOSIS — C787 Secondary malignant neoplasm of liver and intrahepatic bile duct: Secondary | ICD-10-CM

## 2012-10-12 DIAGNOSIS — M810 Age-related osteoporosis without current pathological fracture: Secondary | ICD-10-CM

## 2012-10-12 DIAGNOSIS — C7952 Secondary malignant neoplasm of bone marrow: Secondary | ICD-10-CM

## 2012-10-12 DIAGNOSIS — M7989 Other specified soft tissue disorders: Secondary | ICD-10-CM

## 2012-10-12 DIAGNOSIS — C50119 Malignant neoplasm of central portion of unspecified female breast: Secondary | ICD-10-CM

## 2012-10-12 DIAGNOSIS — C50919 Malignant neoplasm of unspecified site of unspecified female breast: Secondary | ICD-10-CM

## 2012-10-12 DIAGNOSIS — Z5111 Encounter for antineoplastic chemotherapy: Secondary | ICD-10-CM

## 2012-10-12 LAB — CBC WITH DIFFERENTIAL (CANCER CENTER ONLY)
BASO#: 0 10e3/uL (ref 0.0–0.2)
BASO%: 0.2 % (ref 0.0–2.0)
EOS%: 0.4 % (ref 0.0–7.0)
Eosinophils Absolute: 0 10e3/uL (ref 0.0–0.5)
HCT: 39.9 % (ref 34.8–46.6)
HGB: 12.9 g/dL (ref 11.6–15.9)
LYMPH#: 3.2 10e3/uL (ref 0.9–3.3)
LYMPH%: 38.4 % (ref 14.0–48.0)
MCH: 32.7 pg (ref 26.0–34.0)
MCHC: 32.3 g/dL (ref 32.0–36.0)
MCV: 101 fL (ref 81–101)
MONO#: 0.8 10e3/uL (ref 0.1–0.9)
MONO%: 9.9 % (ref 0.0–13.0)
NEUT#: 4.2 10e3/uL (ref 1.5–6.5)
NEUT%: 51.1 % (ref 39.6–80.0)
Platelets: 179 10e3/uL (ref 145–400)
RBC: 3.94 10e6/uL (ref 3.70–5.32)
RDW: 12.7 % (ref 11.1–15.7)
WBC: 8.3 10e3/uL (ref 3.9–10.0)

## 2012-10-12 MED ORDER — ONDANSETRON 8 MG/50ML IVPB (CHCC)
8.0000 mg | Freq: Once | INTRAVENOUS | Status: AC
  Administered 2012-10-12: 8 mg via INTRAVENOUS

## 2012-10-12 MED ORDER — ALTEPLASE 2 MG IJ SOLR
2.0000 mg | Freq: Once | INTRAMUSCULAR | Status: DC | PRN
  Filled 2012-10-12: qty 2

## 2012-10-12 MED ORDER — SODIUM CHLORIDE 0.9 % IJ SOLN
10.0000 mL | INTRAMUSCULAR | Status: DC | PRN
  Filled 2012-10-12: qty 10

## 2012-10-12 MED ORDER — SODIUM CHLORIDE 0.9 % IV SOLN
Freq: Once | INTRAVENOUS | Status: AC
  Administered 2012-10-12: 16:00:00 via INTRAVENOUS

## 2012-10-12 MED ORDER — ZOLEDRONIC ACID 4 MG/100ML IV SOLN
4.0000 mg | Freq: Once | INTRAVENOUS | Status: AC
  Administered 2012-10-12: 4 mg via INTRAVENOUS
  Filled 2012-10-12: qty 100

## 2012-10-12 MED ORDER — HEPARIN SOD (PORK) LOCK FLUSH 100 UNIT/ML IV SOLN
250.0000 [IU] | Freq: Once | INTRAVENOUS | Status: DC | PRN
  Filled 2012-10-12: qty 5

## 2012-10-12 MED ORDER — DEXAMETHASONE SODIUM PHOSPHATE 10 MG/ML IJ SOLN
10.0000 mg | Freq: Once | INTRAMUSCULAR | Status: AC
  Administered 2012-10-12: 10 mg via INTRAVENOUS

## 2012-10-12 MED ORDER — HEPARIN SOD (PORK) LOCK FLUSH 100 UNIT/ML IV SOLN
500.0000 [IU] | Freq: Once | INTRAVENOUS | Status: DC | PRN
  Filled 2012-10-12: qty 5

## 2012-10-12 MED ORDER — PACLITAXEL PROTEIN-BOUND CHEMO INJECTION 100 MG
100.0000 mg/m2 | Freq: Once | INTRAVENOUS | Status: AC
  Administered 2012-10-12: 150 mg via INTRAVENOUS
  Filled 2012-10-12: qty 30

## 2012-10-12 MED ORDER — SODIUM CHLORIDE 0.9 % IJ SOLN
3.0000 mL | INTRAMUSCULAR | Status: DC | PRN
  Filled 2012-10-12: qty 10

## 2012-10-12 NOTE — Progress Notes (Signed)
This office note has been dictated.

## 2012-10-12 NOTE — Patient Instructions (Signed)
[facility] Discharge Instructions for Patients Receiving Chemotherapy  Today you received the following chemotherapy agents Abraxane To help prevent nausea and vomiting after your treatment, we encourage you to take your nausea medication as prescribed. If you develop nausea and vomiting that is not controlled by your nausea medication, call the clinic. If it is after clinic hours your family physician or the after hours number for the clinic or go to the Emergency Department.   BELOW ARE SYMPTOMS THAT SHOULD BE REPORTED IMMEDIATELY:  *FEVER GREATER THAN 100.5 F  *CHILLS WITH OR WITHOUT FEVER  NAUSEA AND VOMITING THAT IS NOT CONTROLLED WITH YOUR NAUSEA MEDICATION  *UNUSUAL SHORTNESS OF BREATH  *UNUSUAL BRUISING OR BLEEDING  TENDERNESS IN MOUTH AND THROAT WITH OR WITHOUT PRESENCE OF ULCERS  *URINARY PROBLEMS  *BOWEL PROBLEMS  UNUSUAL RASH Items with * indicate a potential emergency and should be followed up as soon as possible.  One of the nurses will contact you 24 hours after your treatment. Please let the nurse know about any problems that you may have experienced. Feel free to call the clinic you have any questions or concerns. The clinic phone number is [phone].   I have been informed and understand all the instructions given to me. I know to contact the clinic, my physician, or go to the Emergency Department if any problems should occur. I do not have any questions at this time, but understand that I may call the clinic during office hours   should I have any questions or need assistance in obtaining follow up care.    __________________________________________  _____________  __________ Signature of Patient or Authorized Representative            Date                   Time    __________________________________________ Nurse's Signature    

## 2012-10-13 ENCOUNTER — Other Ambulatory Visit: Payer: Self-pay | Admitting: *Deleted

## 2012-10-13 LAB — COMPREHENSIVE METABOLIC PANEL
ALT: 17 U/L (ref 0–35)
AST: 20 U/L (ref 0–37)
Albumin: 4 g/dL (ref 3.5–5.2)
Alkaline Phosphatase: 73 U/L (ref 39–117)
Potassium: 3.8 mEq/L (ref 3.5–5.3)
Sodium: 138 mEq/L (ref 135–145)
Total Protein: 5.9 g/dL — ABNORMAL LOW (ref 6.0–8.3)

## 2012-10-13 NOTE — Progress Notes (Signed)
CC:   Anna Perla, DO  DIAGNOSIS:  Metastatic breast cancer.  CURRENT THERAPY:  Patient is status post 9 cycles of weekly Abraxane (3 weeks on/1 week off).  INTERIM HISTORY:  Anna Benjamin comes in for followup.  She continues to look quite well.  She has really had no problems with the Abraxane.  So far, there has been no neuropathy.  Her appetite has remained good.  There has been no abdominal pain.  She has had no cough.  She has had a little bit of leg swelling.  She is on a blood pressure medicine with a diuretic in it.  She may need to have a formal diuretic.  She is on Megace which might be causing some of the swelling in her legs.  Her last PET scan was in November.  We are going to have to get her set up with another one after this 3-week cycle is completed.  CA27.29 has been fluctuating.  It recently was 609 back in April. Again, we will have to check her PET scan to see how things look.  She has had no headache.  She has had no joint aches or pains.  PHYSICAL EXAMINATION:  General:  This is a petite white female in no obvious distress.  Vital Signs:  Temperature of 97.4, pulse 82, respiratory rate 16, blood pressure 129/79.  Weight is 123.  Head and Neck:  Normocephalic, atraumatic skull.  There are no ocular or oral lesions.  There are no palpable cervical or supraclavicular lymph nodes. Lungs:  Clear bilaterally.  Cardiac:  Regular rate and rhythm with normal S1 and S2.  There are no murmurs, rubs or bruits.  Abdomen:  Soft with good bowel sounds.  There is no palpable abdominal mass.  There is no palpable hepatosplenomegaly.  Back.  Back:  No tenderness over the spine, ribs, or hips.  Extremities:  No clubbing, cyanosis, or edema. Neurological:  No focal neurological deficits.  LABORATORY STUDIES:  White cell count is 8.3, hemoglobin 12.9, hematocrit 39.9, platelet count 179,000.  IMPRESSION:  Anna Benjamin is a 77 year old white female with metastatic breast  cancer.  She actually has done very nicely.  Her quality of life is quite good.  She has tolerated treatment well.  We will go ahead and complete this 3-week cycle.  We will then repeat her PET scan and see how things look.  I forgot to mention that she does get Zometa, also.  Again, we will get the PET scan set up for a couple of weeks.  I will plan to see her back in 3 weeks.  The PET scan will sort of dictate what we need to do treatment-wise on her.    ______________________________ Anna Benjamin, M.D. PRE/MEDQ  D:  10/12/2012  T:  10/13/2012  Job:  [id number]

## 2012-10-14 ENCOUNTER — Other Ambulatory Visit: Payer: Self-pay | Admitting: *Deleted

## 2012-10-14 MED ORDER — TRIAMTERENE-HCTZ 37.5-25 MG PO TABS: 1.0000 | ORAL_TABLET | Freq: Every day | ORAL | Status: DC | PRN

## 2012-10-14 NOTE — Telephone Encounter (Signed)
To start pt on Maxzide 37.5 mg\25 daily PRN. Dr Myna Hidalgo was made aware of the warning that occurred during the order entry process and stated to continue her on it anyway. Rx sent via e-rx to [facility].

## 2012-10-16 ENCOUNTER — Other Ambulatory Visit: Payer: Self-pay | Admitting: *Deleted

## 2012-10-16 ENCOUNTER — Other Ambulatory Visit: Payer: Self-pay | Admitting: Family Medicine

## 2012-10-16 DIAGNOSIS — F419 Anxiety disorder, unspecified: Secondary | ICD-10-CM

## 2012-10-16 DIAGNOSIS — C50919 Malignant neoplasm of unspecified site of unspecified female breast: Secondary | ICD-10-CM

## 2012-10-16 MED ORDER — HYDROCODONE-ACETAMINOPHEN 10-325 MG PO TABS: ORAL_TABLET | ORAL | Status: DC

## 2012-10-16 MED ORDER — DEXAMETHASONE 4 MG PO TABS: ORAL_TABLET | ORAL | Status: DC

## 2012-10-16 MED ORDER — PROCHLORPERAZINE MALEATE 10 MG PO TABS: 10.0000 mg | ORAL_TABLET | ORAL | Status: DC | PRN

## 2012-10-19 ENCOUNTER — Other Ambulatory Visit: Payer: Medicare Other | Admitting: Lab

## 2012-10-19 ENCOUNTER — Ambulatory Visit (HOSPITAL_BASED_OUTPATIENT_CLINIC_OR_DEPARTMENT_OTHER): Payer: Medicare Other

## 2012-10-19 VITALS — BP 101/68 | HR 78 | Temp 97.0°F | Resp 16

## 2012-10-19 DIAGNOSIS — C50119 Malignant neoplasm of central portion of unspecified female breast: Secondary | ICD-10-CM

## 2012-10-19 DIAGNOSIS — C50919 Malignant neoplasm of unspecified site of unspecified female breast: Secondary | ICD-10-CM

## 2012-10-19 DIAGNOSIS — C787 Secondary malignant neoplasm of liver and intrahepatic bile duct: Secondary | ICD-10-CM

## 2012-10-19 DIAGNOSIS — Z5111 Encounter for antineoplastic chemotherapy: Secondary | ICD-10-CM

## 2012-10-19 MED ORDER — DEXAMETHASONE SODIUM PHOSPHATE 10 MG/ML IJ SOLN
10.0000 mg | Freq: Once | INTRAMUSCULAR | Status: AC
  Administered 2012-10-19: 10 mg via INTRAVENOUS

## 2012-10-19 MED ORDER — ONDANSETRON 8 MG/50ML IVPB (CHCC)
8.0000 mg | Freq: Once | INTRAVENOUS | Status: AC
  Administered 2012-10-19: 8 mg via INTRAVENOUS

## 2012-10-19 MED ORDER — PACLITAXEL PROTEIN-BOUND CHEMO INJECTION 100 MG
100.0000 mg/m2 | Freq: Once | INTRAVENOUS | Status: AC
  Administered 2012-10-19: 150 mg via INTRAVENOUS
  Filled 2012-10-19: qty 30

## 2012-10-19 MED ORDER — SODIUM CHLORIDE 0.9 % IV SOLN
Freq: Once | INTRAVENOUS | Status: AC
  Administered 2012-10-19: 12:00:00 via INTRAVENOUS

## 2012-10-26 ENCOUNTER — Ambulatory Visit (HOSPITAL_BASED_OUTPATIENT_CLINIC_OR_DEPARTMENT_OTHER): Payer: Medicare Other | Admitting: Lab

## 2012-10-26 ENCOUNTER — Ambulatory Visit (HOSPITAL_BASED_OUTPATIENT_CLINIC_OR_DEPARTMENT_OTHER): Payer: Medicare Other

## 2012-10-26 VITALS — BP 121/64 | HR 75 | Temp 97.1°F | Resp 18

## 2012-10-26 DIAGNOSIS — C50119 Malignant neoplasm of central portion of unspecified female breast: Secondary | ICD-10-CM

## 2012-10-26 DIAGNOSIS — C50919 Malignant neoplasm of unspecified site of unspecified female breast: Secondary | ICD-10-CM

## 2012-10-26 DIAGNOSIS — C787 Secondary malignant neoplasm of liver and intrahepatic bile duct: Secondary | ICD-10-CM

## 2012-10-26 DIAGNOSIS — Z5111 Encounter for antineoplastic chemotherapy: Secondary | ICD-10-CM

## 2012-10-26 DIAGNOSIS — C7951 Secondary malignant neoplasm of bone: Secondary | ICD-10-CM

## 2012-10-26 LAB — CBC WITH DIFFERENTIAL (CANCER CENTER ONLY)
BASO%: 0.3 % (ref 0.0–2.0)
LYMPH%: 45.6 % (ref 14.0–48.0)
MCH: 33.2 pg (ref 26.0–34.0)
MCV: 100 fL (ref 81–101)
MONO%: 5.8 % (ref 0.0–13.0)
Platelets: 208 10*3/uL (ref 145–400)
RDW: 12.6 % (ref 11.1–15.7)
WBC: 6.8 10*3/uL (ref 3.9–10.0)

## 2012-10-26 MED ORDER — ONDANSETRON 8 MG/50ML IVPB (CHCC)
8.0000 mg | Freq: Once | INTRAVENOUS | Status: AC
  Administered 2012-10-26: 8 mg via INTRAVENOUS

## 2012-10-26 MED ORDER — SODIUM CHLORIDE 0.9 % IV SOLN
Freq: Once | INTRAVENOUS | Status: AC
  Administered 2012-10-26: 12:00:00 via INTRAVENOUS

## 2012-10-26 MED ORDER — DEXAMETHASONE SODIUM PHOSPHATE 10 MG/ML IJ SOLN
10.0000 mg | Freq: Once | INTRAMUSCULAR | Status: AC
  Administered 2012-10-26: 10 mg via INTRAVENOUS

## 2012-10-26 MED ORDER — PACLITAXEL PROTEIN-BOUND CHEMO INJECTION 100 MG
100.0000 mg/m2 | Freq: Once | INTRAVENOUS | Status: AC
  Administered 2012-10-26: 150 mg via INTRAVENOUS
  Filled 2012-10-26: qty 30

## 2012-10-26 NOTE — Patient Instructions (Signed)

## 2012-10-28 ENCOUNTER — Encounter (HOSPITAL_BASED_OUTPATIENT_CLINIC_OR_DEPARTMENT_OTHER)
Admission: RE | Admit: 2012-10-28 | Discharge: 2012-10-28 | Disposition: A | Discharge status: Home / Self Care | Payer: Medicare Other | Source: Ambulatory Visit | Attending: Hematology & Oncology | Admitting: Hematology & Oncology

## 2012-10-28 DIAGNOSIS — C50919 Malignant neoplasm of unspecified site of unspecified female breast: Secondary | ICD-10-CM

## 2012-10-28 MED ORDER — FLUDEOXYGLUCOSE F - 18 (FDG) INJECTION
12.5500 | Freq: Once | INTRAVENOUS | Status: AC | PRN
  Administered 2012-10-28: 12.55 via INTRAVENOUS

## 2012-11-03 ENCOUNTER — Other Ambulatory Visit (HOSPITAL_COMMUNITY): Payer: Medicare Other

## 2012-11-05 ENCOUNTER — Ambulatory Visit (HOSPITAL_BASED_OUTPATIENT_CLINIC_OR_DEPARTMENT_OTHER): Payer: Medicare Other | Admitting: Hematology & Oncology

## 2012-11-05 ENCOUNTER — Telehealth: Payer: Self-pay | Admitting: Hematology & Oncology

## 2012-11-05 ENCOUNTER — Other Ambulatory Visit (HOSPITAL_BASED_OUTPATIENT_CLINIC_OR_DEPARTMENT_OTHER): Payer: Medicare Other | Admitting: Lab

## 2012-11-05 VITALS — BP 130/71 | HR 84 | Temp 97.6°F | Resp 16 | Ht 59.0 in | Wt 121.0 lb

## 2012-11-05 DIAGNOSIS — C50119 Malignant neoplasm of central portion of unspecified female breast: Secondary | ICD-10-CM

## 2012-11-05 DIAGNOSIS — C787 Secondary malignant neoplasm of liver and intrahepatic bile duct: Secondary | ICD-10-CM

## 2012-11-05 DIAGNOSIS — C50919 Malignant neoplasm of unspecified site of unspecified female breast: Secondary | ICD-10-CM

## 2012-11-05 DIAGNOSIS — C7951 Secondary malignant neoplasm of bone: Secondary | ICD-10-CM

## 2012-11-05 LAB — CBC WITH DIFFERENTIAL (CANCER CENTER ONLY)
BASO#: 0 10*3/uL (ref 0.0–0.2)
Eosinophils Absolute: 0 10*3/uL (ref 0.0–0.5)
HCT: 36.4 % (ref 34.8–46.6)
HGB: 11.9 g/dL (ref 11.6–15.9)
LYMPH%: 43.3 % (ref 14.0–48.0)
MCH: 33.2 pg (ref 26.0–34.0)
MCV: 102 fL — ABNORMAL HIGH (ref 81–101)
MONO#: 0.5 10*3/uL (ref 0.1–0.9)
MONO%: 8.1 % (ref 0.0–13.0)
NEUT%: 47.8 % (ref 39.6–80.0)
Platelets: 214 10*3/uL (ref 145–400)
RBC: 3.58 10*6/uL — ABNORMAL LOW (ref 3.70–5.32)
WBC: 5.7 10*3/uL (ref 3.9–10.0)

## 2012-11-05 LAB — COMPREHENSIVE METABOLIC PANEL
ALT: 17 U/L (ref 0–35)
CO2: 24 mEq/L (ref 19–32)
Calcium: 9 mg/dL (ref 8.4–10.5)
Chloride: 107 mEq/L (ref 96–112)
Creatinine, Ser: 0.73 mg/dL (ref 0.50–1.10)
Glucose, Bld: 144 mg/dL — ABNORMAL HIGH (ref 70–99)
Total Bilirubin: 0.6 mg/dL (ref 0.3–1.2)

## 2012-11-05 NOTE — Progress Notes (Signed)
This office note has been dictated.

## 2012-11-05 NOTE — Telephone Encounter (Signed)
Pt aware of 6-11 2D Echo at 9am at [facility]. Per Dr. Myna Hidalgo ok to move appointments out a week.

## 2012-11-06 LAB — LACTATE DEHYDROGENASE: LDH: 162 U/L (ref 94–250)

## 2012-11-06 NOTE — Progress Notes (Signed)
CC:   Lelon Perla, DO  DIAGNOSIS:  Metastatic breast cancer, progressive.  CURRENT THERAPY: 1. The patient is status post 10 cycles of Abraxane. 2. Zometa q.month.  INTERIM HISTORY:  Anna Benjamin comes in for followup.  We did go ahead and repeat her PET scan.  This was done back on 05/21.  This did show some progressive disease.  She did have a new metastasis in the liver. She has 3 liver metastases.  She has stable bone disease.  There is no pulmonary/chest disease.  No lymphadenopathy was appreciated.  Her CA27.29 has been going up.  It was 579 back in early May.  She actually has tolerated the Abraxane well.  She really has not had neuropathy.  She has been pretty functional.  She is on Vicodin, which has helped with her pain issues.  Her appetite has been okay.  She had no nausea or vomiting.  There has been no cough or shortness of breath.  She has had no leg swelling. There have been no rashes.  Overall, she has a good performance status of ECOG 1.  PHYSICAL EXAMINATION:  General:  This is a petite white female who is well-nourished.  Vital signs:  Show a temperature of 97.6, pulse 84, respiratory rate 16, blood pressure 130/74.  Weight is 121.  Head and neck:  Shows a normocephalic, atraumatic skull.  There are no ocular or oral lesions.  There are no palpable cervical or supraclavicular lymph nodes.  Lungs:  Clear bilaterally.  Cardiac:  Regular rate and rhythm with a normal S1 and S2.  There are no murmurs, rubs or bruits. Abdomen:  Soft with good bowel sounds.  There is no palpable abdominal mass.  There is no fluid wave.  There is no palpable hepatosplenomegaly. Extremities:  Show no clubbing, cyanosis or edema.  Neurological:  Shows no focal neurological deficits.  Skin:  No rashes, ecchymoses or petechiae.  LABORATORY STUDIES:  Show a white cell count of 5.7, hemoglobin 12, hematocrit 36.4, platelet count 214.  Sodium 139, potassium 3.4, BUN 20, creatinine  0.73.  Liver function tests were normal.  IMPRESSION:  Anna Benjamin is a very nice 77 year old white female with metastatic breast cancer.  She actually has done quite well so far.  She was on Abraxane probably for about 9 months or so.  She did well with this.  It is no surprise that she finally has progressed.  Since she still has a decent performance status, I think that we probably could get away with another line of therapy on her.  She has never had anthracycline based therapy.  As such, I think that Doxil would be a good idea for her.  It would be easy for her to take. It is 1 day every 4 weeks.  I do not see any obvious contraindication for her to have Doxil.  I went over the side effects with her.  I told her about the possibility of hand foot syndrome.  I told her about the possibility of diarrhea, mouth sores, low blood counts.  Overall, I think that she would be able to tolerate Doxil.  I think that she has a decent chance of responding to this.  We will get an echocardiogram on her next week.  We will start the Doxil in about 1 or 2 weeks.  We are not in a "rush" to get her started.  She is feeling well.  It would be nice if she could maybe take a  couple weeks off so that she could enjoy herself.  Again, Ms. Lydon has done incredibly well.  She is a very tough lady. Hopefully, we will find a response to Doxil.  I would like to do a PET scan after her third cycle of Doxil.    ______________________________ Josph Macho, M.D. PRE/MEDQ  D:  11/05/2012  T:  11/06/2012  Job:  [id number]

## 2012-11-12 ENCOUNTER — Other Ambulatory Visit: Payer: Medicare Other | Admitting: Lab

## 2012-11-12 ENCOUNTER — Ambulatory Visit: Payer: Medicare Other

## 2012-11-16 ENCOUNTER — Ambulatory Visit: Payer: Medicare Other | Admitting: Hematology & Oncology

## 2012-11-16 ENCOUNTER — Ambulatory Visit: Payer: Medicare Other

## 2012-11-16 ENCOUNTER — Other Ambulatory Visit: Payer: Medicare Other | Admitting: Lab

## 2012-11-18 ENCOUNTER — Ambulatory Visit: Payer: Medicare Other

## 2012-11-18 ENCOUNTER — Ambulatory Visit (HOSPITAL_BASED_OUTPATIENT_CLINIC_OR_DEPARTMENT_OTHER)
Admission: RE | Admit: 2012-11-18 | Discharge: 2012-11-18 | Disposition: A | Discharge status: Home / Self Care | Payer: Medicare Other | Source: Ambulatory Visit | Attending: Hematology & Oncology | Admitting: Hematology & Oncology

## 2012-11-18 ENCOUNTER — Other Ambulatory Visit: Payer: Medicare Other | Admitting: Lab

## 2012-11-18 DIAGNOSIS — I079 Rheumatic tricuspid valve disease, unspecified: Secondary | ICD-10-CM | POA: Insufficient documentation

## 2012-11-18 DIAGNOSIS — I059 Rheumatic mitral valve disease, unspecified: Secondary | ICD-10-CM | POA: Insufficient documentation

## 2012-11-18 DIAGNOSIS — C50919 Malignant neoplasm of unspecified site of unspecified female breast: Secondary | ICD-10-CM | POA: Insufficient documentation

## 2012-11-18 NOTE — Progress Notes (Signed)
  Echocardiogram 2D Echocardiogram has been performed.  Anna Benjamin 11/18/2012, 12:14 PM

## 2012-11-19 ENCOUNTER — Ambulatory Visit (HOSPITAL_BASED_OUTPATIENT_CLINIC_OR_DEPARTMENT_OTHER): Payer: Medicare Other

## 2012-11-19 ENCOUNTER — Ambulatory Visit (HOSPITAL_BASED_OUTPATIENT_CLINIC_OR_DEPARTMENT_OTHER): Payer: Medicare Other | Admitting: Lab

## 2012-11-19 ENCOUNTER — Telehealth: Payer: Self-pay | Admitting: Oncology

## 2012-11-19 VITALS — BP 125/70 | HR 84 | Temp 98.1°F | Resp 20

## 2012-11-19 DIAGNOSIS — C50119 Malignant neoplasm of central portion of unspecified female breast: Secondary | ICD-10-CM

## 2012-11-19 DIAGNOSIS — M810 Age-related osteoporosis without current pathological fracture: Secondary | ICD-10-CM

## 2012-11-19 DIAGNOSIS — Z5111 Encounter for antineoplastic chemotherapy: Secondary | ICD-10-CM

## 2012-11-19 DIAGNOSIS — C7951 Secondary malignant neoplasm of bone: Secondary | ICD-10-CM

## 2012-11-19 DIAGNOSIS — C50919 Malignant neoplasm of unspecified site of unspecified female breast: Secondary | ICD-10-CM

## 2012-11-19 DIAGNOSIS — F419 Anxiety disorder, unspecified: Secondary | ICD-10-CM

## 2012-11-19 DIAGNOSIS — C7952 Secondary malignant neoplasm of bone marrow: Secondary | ICD-10-CM

## 2012-11-19 LAB — CBC WITH DIFFERENTIAL (CANCER CENTER ONLY)
BASO%: 0.3 % (ref 0.0–2.0)
Eosinophils Absolute: 0 10*3/uL (ref 0.0–0.5)
HCT: 38.5 % (ref 34.8–46.6)
LYMPH#: 3.2 10*3/uL (ref 0.9–3.3)
MONO#: 0.8 10*3/uL (ref 0.1–0.9)
NEUT%: 64.4 % (ref 39.6–80.0)
Platelets: 190 10*3/uL (ref 145–400)
RBC: 3.85 10*6/uL (ref 3.70–5.32)
RDW: 12.2 % (ref 11.1–15.7)
WBC: 11.3 10*3/uL — ABNORMAL HIGH (ref 3.9–10.0)

## 2012-11-19 LAB — BASIC METABOLIC PANEL
CO2: 25 mEq/L (ref 19–32)
Chloride: 105 mEq/L (ref 96–112)
Glucose, Bld: 133 mg/dL — ABNORMAL HIGH (ref 70–99)
Sodium: 140 mEq/L (ref 135–145)

## 2012-11-19 MED ORDER — SODIUM CHLORIDE 0.9 % IV SOLN
Freq: Once | INTRAVENOUS | Status: AC
  Administered 2012-11-19: 13:00:00 via INTRAVENOUS

## 2012-11-19 MED ORDER — ONDANSETRON 8 MG/50ML IVPB (CHCC)
8.0000 mg | Freq: Once | INTRAVENOUS | Status: AC
  Administered 2012-11-19: 8 mg via INTRAVENOUS

## 2012-11-19 MED ORDER — DOXORUBICIN HCL LIPOSOMAL CHEMO INJECTION 2 MG/ML
60.0000 mg | Freq: Once | INTRAVENOUS | Status: AC
  Administered 2012-11-19: 60 mg via INTRAVENOUS
  Filled 2012-11-19: qty 30

## 2012-11-19 MED ORDER — LORAZEPAM 0.5 MG PO TABS: 0.5000 mg | ORAL_TABLET | Freq: Four times a day (QID) | ORAL | Status: DC | PRN

## 2012-11-19 MED ORDER — DEXAMETHASONE SODIUM PHOSPHATE 10 MG/ML IJ SOLN
10.0000 mg | Freq: Once | INTRAMUSCULAR | Status: AC
  Administered 2012-11-19: 10 mg via INTRAVENOUS

## 2012-11-19 MED ORDER — ZOLEDRONIC ACID 4 MG/100ML IV SOLN
4.0000 mg | Freq: Once | INTRAVENOUS | Status: AC
  Administered 2012-11-19: 4 mg via INTRAVENOUS
  Filled 2012-11-19: qty 100

## 2012-11-19 NOTE — Patient Instructions (Signed)
Doxorubicin Liposomal injection  What is this medicine?  LIPOSOMAL DOXORUBICIN (LIP oh som al dox oh ROO bi sin) is a chemotherapy drug. This medicine is used to treat many kinds of cancer like Kaposi's sarcoma, multiple myeloma, and ovarian cancer.  This medicine may be used for other purposes; ask your health care provider or pharmacist if you have questions.  What should I tell my health care provider before I take this medicine?  They need to know if you have any of these conditions:  -blood disorders  -heart disease  -infection (especially a virus infection such as chickenpox, cold sores, or herpes)  -liver disease  -recent or ongoing radiation therapy  -an unusual or allergic reaction to doxorubicin, other chemotherapy agents, soybeans, other medicines, foods, dyes, or preservatives  -pregnant or trying to get pregnant  -breast-feeding  How should I use this medicine?  This drug is given as an infusion into a vein. It is administered in a hospital or clinic by a specially trained health care professional. If you have pain, swelling, burning or any unusual feeling around the site of your injection, tell your health care professional right away.  Talk to your pediatrician regarding the use of this medicine in children. Special care may be needed.  Overdosage: If you think you have taken too much of this medicine contact a poison control center or emergency room at once.  NOTE: This medicine is only for you. Do not share this medicine with others.  What if I miss a dose?  It is important not to miss your dose. Call your doctor or health care professional if you are unable to keep an appointment.  What may interact with this medicine?  Do not take this medicine with any of the following medications:  -zidovudine  This medicine may also interact with the following medications:  -medicines to increase blood counts like filgrastim, pegfilgrastim, sargramostim  -vaccines  Talk to your doctor or health care  professional before taking any of these medicines:  -acetaminophen  -aspirin  -ibuprofen  -ketoprofen  -naproxen  This list may not describe all possible interactions. Give your health care provider a list of all the medicines, herbs, non-prescription drugs, or dietary supplements you use. Also tell them if you smoke, drink alcohol, or use illegal drugs. Some items may interact with your medicine.  What should I watch for while using this medicine?  Your condition will be monitored carefully while you are receiving this medicine. You will need important blood work done while you are taking this medicine.  This drug may make you feel generally unwell. This is not uncommon, as chemotherapy can affect healthy cells as well as cancer cells. Report any side effects. Continue your course of treatment even though you feel ill unless your doctor tells you to stop.  Your urine may turn orange-red for a few days after your dose. This is not blood. If your urine is dark or brown, call your doctor.  In some cases, you may be given additional medicines to help with side effects. Follow all directions for their use.  Call your doctor or health care professional for advice if you get a fever (100.5 degrees F or higher), chills or sore throat, or other symptoms of a cold or flu. Do not treat yourself. This drug decreases your body's ability to fight infections. Try to avoid being around people who are sick.  This medicine may increase your risk to bruise or bleed. Call your doctor or   you are receiving this medicine. Avoid taking products that contain aspirin, acetaminophen, ibuprofen, naproxen, or ketoprofen unless instructed by your doctor. These medicines may hide a fever. Men and women of  childbearing age should use effective birth control methods while using taking this medicine. Do not become pregnant while taking this medicine. There is a potential for serious side effects to an unborn child. Talk to your health care professional or pharmacist for more information. Do not breast-feed an infant while taking this medicine. What side effects may I notice from receiving this medicine? Side effects that you should report to your doctor or health care professional as soon as possible: -allergic reactions like skin rash, itching or hives, swelling of the face, lips, or tongue -low blood counts - this medicine may decrease the number of white blood cells, red blood cells and platelets. You may be at increased risk for infections and bleeding. -signs of hand-foot syndrome - tingling or burning, redness, flaking, swelling, small blisters, or small sores on the palms of your hands or the soles of your feet -signs of infection - fever or chills, cough, sore throat, pain or difficulty passing urine -signs of decreased platelets or bleeding - bruising, pinpoint red spots on the skin, black, tarry stools, blood in the urine -signs of decreased red blood cells - unusually weak or tired, fainting spells, lightheadedness -back pain, chills, facial flushing, fever, headache, tightness in the chest or throat during the infusion -breathing problems -chest pain -fast, irregular heartbeat -mouth pain, redness, sores -pain, swelling, redness at site where injected -pain, tingling, numbness in the hands or feet -swelling of ankles, feet, or hands -vomiting Side effects that usually do not require medical attention (report to your doctor or health care professional if they continue or are bothersome): -diarrhea -hair loss -loss of appetite -nail discoloration or damage -nausea -red or watery eyes -red colored urine -stomach upset This list may not describe all possible side effects. Call your  doctor for medical advice about side effects. You may report side effects to FDA at 1-800-FDA-1088. Where should I keep my medicine? This drug is given in a hospital or clinic and will not be stored at home. NOTE: This sheet is a summary. It may not cover all possible information. If you have questions about this medicine, talk to your doctor, pharmacist, or health care provider.  2013, [employer] Standard. (09/15/2007 5:08:57 PM)  Zoledronic Acid injection (Hypercalcemia, Oncology) What is this medicine? ZOLEDRONIC ACID (ZOE le dron ik AS id) lowers the amount of calcium loss from bone. It is used to treat too much calcium in your blood from cancer. It is also used to prevent complications of cancer that has spread to the bone. This medicine may be used for other purposes; ask your health care provider or pharmacist if you have questions. What should I tell my health care provider before I take this medicine? They need to know if you have any of these conditions: -aspirin-sensitive asthma -dental disease -kidney disease -an unusual or allergic reaction to zoledronic acid, other medicines, foods, dyes, or preservatives -pregnant or trying to get pregnant -breast-feeding How should I use this medicine? This medicine is for infusion into a vein. It is given by a health care professional in a hospital or clinic setting. Talk to your pediatrician regarding the use of this medicine in children. Special care may be needed. Overdosage: If you think you have taken too much of this medicine contact a poison control center or emergency  room at once. NOTE: This medicine is only for you. Do not share this medicine with others. What if I miss a dose? It is important not to miss your dose. Call your doctor or health care professional if you are unable to keep an appointment. What may interact with this medicine? -certain antibiotics given by injection -NSAIDs, medicines for pain and inflammation, like  ibuprofen or naproxen -some diuretics like bumetanide, furosemide -teriparatide -thalidomide This list may not describe all possible interactions. Give your health care provider a list of all the medicines, herbs, non-prescription drugs, or dietary supplements you use. Also tell them if you smoke, drink alcohol, or use illegal drugs. Some items may interact with your medicine. What should I watch for while using this medicine? Visit your doctor or health care professional for regular checkups. It may be some time before you see the benefit from this medicine. Do not stop taking your medicine unless your doctor tells you to. Your doctor may order blood tests or other tests to see how you are doing. Women should inform their doctor if they wish to become pregnant or think they might be pregnant. There is a potential for serious side effects to an unborn child. Talk to your health care professional or pharmacist for more information. You should make sure that you get enough calcium and vitamin D while you are taking this medicine. Discuss the foods you eat and the vitamins you take with your health care professional. Some people who take this medicine have severe bone, joint, and/or muscle pain. This medicine may also increase your risk for a broken thigh bone. Tell your doctor right away if you have pain in your upper leg or groin. Tell your doctor if you have any pain that does not go away or that gets worse. What side effects may I notice from receiving this medicine? Side effects that you should report to your doctor or health care professional as soon as possible: -allergic reactions like skin rash, itching or hives, swelling of the face, lips, or tongue -anxiety, confusion, or depression -breathing problems -changes in vision -feeling faint or lightheaded, falls -jaw burning, cramping, pain -muscle cramps, stiffness, or weakness -trouble passing urine or change in the amount of urine Side  effects that usually do not require medical attention (report to your doctor or health care professional if they continue or are bothersome): -bone, joint, or muscle pain -fever -hair loss -irritation at site where injected -loss of appetite -nausea, vomiting -stomach upset -tired This list may not describe all possible side effects. Call your doctor for medical advice about side effects. You may report side effects to FDA at 1-800-FDA-1088. Where should I keep my medicine? This drug is given in a hospital or clinic and will not be stored at home. NOTE: This sheet is a summary. It may not cover all possible information. If you have questions about this medicine, talk to your doctor, pharmacist, or health care provider.  2013, [employer]/Gold Standard. (11/23/2010 9:06:58 AM)

## 2012-11-19 NOTE — Telephone Encounter (Addendum)
Message copied by Lacie Draft on Thu Nov 19, 2012  4:46 PM ------      Message from: Arlan Organ R      Created: Wed Nov 18, 2012  3:38 PM       Call - heart is pumping like a champion!!   Cindee Lame ------Spoke with patient regarding cardiac echo. Also discussed using Udder Cream for skin.

## 2012-11-19 NOTE — Telephone Encounter (Signed)
Called in Ativan for N&V to [facility].

## 2012-11-20 ENCOUNTER — Telehealth: Payer: Self-pay | Admitting: Oncology

## 2012-11-20 ENCOUNTER — Encounter: Payer: Self-pay | Admitting: Oncology

## 2012-11-20 NOTE — Telephone Encounter (Signed)
See Doc Encounter ie 24 hour f/u call.

## 2012-11-20 NOTE — Progress Notes (Signed)
24 Hour Chemotherapy Follow up Call  Anna Benjamin called at home following administration of Doxorubicin chemotherapy. Patient reports no symptoms.  Medications reviewed Zofran and Ativan.   Following interventions recommended Tylenol for HA.  Reviewed all post chemotherapy instructions with patient and when should call clinic or MD.  Patient verbalized understanding.

## 2012-11-26 ENCOUNTER — Encounter: Payer: Self-pay | Admitting: Hematology & Oncology

## 2012-11-30 ENCOUNTER — Other Ambulatory Visit: Payer: Self-pay | Admitting: Nurse Practitioner

## 2012-11-30 DIAGNOSIS — C50919 Malignant neoplasm of unspecified site of unspecified female breast: Secondary | ICD-10-CM

## 2012-11-30 DIAGNOSIS — F419 Anxiety disorder, unspecified: Secondary | ICD-10-CM

## 2012-11-30 NOTE — Telephone Encounter (Signed)
Received refill request from [facility] for hydrocodone/apap 10-325. This is a chronic med for the pt. Called in to after approval  from Arlan Organ, MD.

## 2012-12-01 ENCOUNTER — Other Ambulatory Visit: Payer: Self-pay | Admitting: *Deleted

## 2012-12-01 NOTE — Telephone Encounter (Signed)
n

## 2012-12-10 ENCOUNTER — Other Ambulatory Visit: Payer: Medicare Other | Admitting: Lab

## 2012-12-10 ENCOUNTER — Ambulatory Visit: Payer: Medicare Other

## 2012-12-10 ENCOUNTER — Ambulatory Visit: Payer: Medicare Other | Admitting: Medical

## 2012-12-17 ENCOUNTER — Ambulatory Visit (HOSPITAL_BASED_OUTPATIENT_CLINIC_OR_DEPARTMENT_OTHER): Payer: Medicare Other

## 2012-12-17 ENCOUNTER — Other Ambulatory Visit (HOSPITAL_BASED_OUTPATIENT_CLINIC_OR_DEPARTMENT_OTHER): Payer: Medicare Other | Admitting: Lab

## 2012-12-17 ENCOUNTER — Ambulatory Visit (HOSPITAL_BASED_OUTPATIENT_CLINIC_OR_DEPARTMENT_OTHER): Payer: Medicare Other | Admitting: Medical

## 2012-12-17 VITALS — BP 125/67 | HR 77 | Temp 97.7°F | Resp 16 | Ht 59.0 in | Wt 120.0 lb

## 2012-12-17 DIAGNOSIS — L271 Localized skin eruption due to drugs and medicaments taken internally: Secondary | ICD-10-CM

## 2012-12-17 DIAGNOSIS — C787 Secondary malignant neoplasm of liver and intrahepatic bile duct: Secondary | ICD-10-CM

## 2012-12-17 DIAGNOSIS — Z5111 Encounter for antineoplastic chemotherapy: Secondary | ICD-10-CM

## 2012-12-17 DIAGNOSIS — L270 Generalized skin eruption due to drugs and medicaments taken internally: Secondary | ICD-10-CM

## 2012-12-17 DIAGNOSIS — C7952 Secondary malignant neoplasm of bone marrow: Secondary | ICD-10-CM

## 2012-12-17 DIAGNOSIS — G4700 Insomnia, unspecified: Secondary | ICD-10-CM

## 2012-12-17 DIAGNOSIS — M810 Age-related osteoporosis without current pathological fracture: Secondary | ICD-10-CM

## 2012-12-17 DIAGNOSIS — C50919 Malignant neoplasm of unspecified site of unspecified female breast: Secondary | ICD-10-CM

## 2012-12-17 DIAGNOSIS — L539 Erythematous condition, unspecified: Secondary | ICD-10-CM

## 2012-12-17 DIAGNOSIS — C50119 Malignant neoplasm of central portion of unspecified female breast: Secondary | ICD-10-CM

## 2012-12-17 DIAGNOSIS — C7951 Secondary malignant neoplasm of bone: Secondary | ICD-10-CM

## 2012-12-17 LAB — CMP (CANCER CENTER ONLY)
Albumin: 3.8 g/dL (ref 3.3–5.5)
Alkaline Phosphatase: 67 U/L (ref 26–84)
BUN, Bld: 18 mg/dL (ref 7–22)
Creat: 0.7 mg/dl (ref 0.6–1.2)
Glucose, Bld: 117 mg/dL (ref 73–118)
Total Bilirubin: 0.6 mg/dl (ref 0.20–1.60)

## 2012-12-17 LAB — CBC WITH DIFFERENTIAL (CANCER CENTER ONLY)
BASO#: 0 10*3/uL (ref 0.0–0.2)
EOS%: 0.2 % (ref 0.0–7.0)
HCT: 39.7 % (ref 34.8–46.6)
HGB: 13 g/dL (ref 11.6–15.9)
LYMPH%: 32.3 % (ref 14.0–48.0)
MCH: 33 pg (ref 26.0–34.0)
MCHC: 32.7 g/dL (ref 32.0–36.0)
MONO%: 10.3 % (ref 0.0–13.0)
NEUT%: 56.8 % (ref 39.6–80.0)

## 2012-12-17 MED ORDER — DOXORUBICIN HCL LIPOSOMAL CHEMO INJECTION 2 MG/ML
40.0000 mg/m2 | Freq: Once | INTRAVENOUS | Status: AC
  Administered 2012-12-17: 60 mg via INTRAVENOUS
  Filled 2012-12-17: qty 30

## 2012-12-17 MED ORDER — DEXAMETHASONE SODIUM PHOSPHATE 10 MG/ML IJ SOLN
10.0000 mg | Freq: Once | INTRAMUSCULAR | Status: AC
  Administered 2012-12-17: 10 mg via INTRAVENOUS

## 2012-12-17 MED ORDER — DOXYCYCLINE HYCLATE 100 MG PO TABS: 100.0000 mg | ORAL_TABLET | Freq: Two times a day (BID) | ORAL | Status: DC

## 2012-12-17 MED ORDER — HEPARIN SOD (PORK) LOCK FLUSH 100 UNIT/ML IV SOLN
250.0000 [IU] | Freq: Once | INTRAVENOUS | Status: DC | PRN
Start: 2012-12-17 — End: 2012-12-17
  Filled 2012-12-17: qty 5

## 2012-12-17 MED ORDER — ONDANSETRON 8 MG/50ML IVPB (CHCC)
8.0000 mg | Freq: Once | INTRAVENOUS | Status: AC
  Administered 2012-12-17: 8 mg via INTRAVENOUS

## 2012-12-17 MED ORDER — ZOLEDRONIC ACID 4 MG/100ML IV SOLN
4.0000 mg | Freq: Once | INTRAVENOUS | Status: AC
  Administered 2012-12-17: 4 mg via INTRAVENOUS
  Filled 2012-12-17: qty 100

## 2012-12-17 MED ORDER — SODIUM CHLORIDE 0.9 % IV SOLN
Freq: Once | INTRAVENOUS | Status: AC
  Administered 2012-12-17: 13:00:00 via INTRAVENOUS

## 2012-12-17 NOTE — Patient Instructions (Addendum)
Doxorubicin Liposomal injection  What is this medicine?  LIPOSOMAL DOXORUBICIN (LIP oh som al dox oh ROO bi sin) is a chemotherapy drug. This medicine is used to treat many kinds of cancer like Kaposi's sarcoma, multiple myeloma, and ovarian cancer.  This medicine may be used for other purposes; ask your health care provider or pharmacist if you have questions.  What should I tell my health care provider before I take this medicine?  They need to know if you have any of these conditions:  -blood disorders  -heart disease  -infection (especially a virus infection such as chickenpox, cold sores, or herpes)  -liver disease  -recent or ongoing radiation therapy  -an unusual or allergic reaction to doxorubicin, other chemotherapy agents, soybeans, other medicines, foods, dyes, or preservatives  -pregnant or trying to get pregnant  -breast-feeding  How should I use this medicine?  This drug is given as an infusion into a vein. It is administered in a hospital or clinic by a specially trained health care professional. If you have pain, swelling, burning or any unusual feeling around the site of your injection, tell your health care professional right away.  Talk to your pediatrician regarding the use of this medicine in children. Special care may be needed.  Overdosage: If you think you have taken too much of this medicine contact a poison control center or emergency room at once.  NOTE: This medicine is only for you. Do not share this medicine with others.  What if I miss a dose?  It is important not to miss your dose. Call your doctor or health care professional if you are unable to keep an appointment.  What may interact with this medicine?  Do not take this medicine with any of the following medications:  -zidovudine  This medicine may also interact with the following medications:  -medicines to increase blood counts like filgrastim, pegfilgrastim, sargramostim  -vaccines  Talk to your doctor or health care  professional before taking any of these medicines:  -acetaminophen  -aspirin  -ibuprofen  -ketoprofen  -naproxen  This list may not describe all possible interactions. Give your health care provider a list of all the medicines, herbs, non-prescription drugs, or dietary supplements you use. Also tell them if you smoke, drink alcohol, or use illegal drugs. Some items may interact with your medicine.  What should I watch for while using this medicine?  Your condition will be monitored carefully while you are receiving this medicine. You will need important blood work done while you are taking this medicine.  This drug may make you feel generally unwell. This is not uncommon, as chemotherapy can affect healthy cells as well as cancer cells. Report any side effects. Continue your course of treatment even though you feel ill unless your doctor tells you to stop.  Your urine may turn orange-red for a few days after your dose. This is not blood. If your urine is dark or brown, call your doctor.  In some cases, you may be given additional medicines to help with side effects. Follow all directions for their use.  Call your doctor or health care professional for advice if you get a fever (100.5 degrees F or higher), chills or sore throat, or other symptoms of a cold or flu. Do not treat yourself. This drug decreases your body's ability to fight infections. Try to avoid being around people who are sick.  This medicine may increase your risk to bruise or bleed. Call your doctor or   you are receiving this medicine. Avoid taking products that contain aspirin, acetaminophen, ibuprofen, naproxen, or ketoprofen unless instructed by your doctor. These medicines may hide a fever. Men and women of  childbearing age should use effective birth control methods while using taking this medicine. Do not become pregnant while taking this medicine. There is a potential for serious side effects to an unborn child. Talk to your health care professional or pharmacist for more information. Do not breast-feed an infant while taking this medicine. What side effects may I notice from receiving this medicine? Side effects that you should report to your doctor or health care professional as soon as possible: -allergic reactions like skin rash, itching or hives, swelling of the face, lips, or tongue -low blood counts - this medicine may decrease the number of white blood cells, red blood cells and platelets. You may be at increased risk for infections and bleeding. -signs of hand-foot syndrome - tingling or burning, redness, flaking, swelling, small blisters, or small sores on the palms of your hands or the soles of your feet -signs of infection - fever or chills, cough, sore throat, pain or difficulty passing urine -signs of decreased platelets or bleeding - bruising, pinpoint red spots on the skin, black, tarry stools, blood in the urine -signs of decreased red blood cells - unusually weak or tired, fainting spells, lightheadedness -back pain, chills, facial flushing, fever, headache, tightness in the chest or throat during the infusion -breathing problems -chest pain -fast, irregular heartbeat -mouth pain, redness, sores -pain, swelling, redness at site where injected -pain, tingling, numbness in the hands or feet -swelling of ankles, feet, or hands -vomiting Side effects that usually do not require medical attention (report to your doctor or health care professional if they continue or are bothersome): -diarrhea -hair loss -loss of appetite -nail discoloration or damage -nausea -red or watery eyes -red colored urine -stomach upset This list may not describe all possible side effects. Call your  doctor for medical advice about side effects. You may report side effects to FDA at 1-800-FDA-1088. Where should I keep my medicine? This drug is given in a hospital or clinic and will not be stored at home. NOTE: This sheet is a summary. It may not cover all possible information. If you have questions about this medicine, talk to your doctor, pharmacist, or health care provider.  2013, [employer] Standard. (09/15/2007 5:08:57 PM)     Zoledronic Acid injection (Hypercalcemia, Oncology) What is this medicine? ZOLEDRONIC ACID (ZOE le dron ik AS id) lowers the amount of calcium loss from bone. It is used to treat too much calcium in your blood from cancer. It is also used to prevent complications of cancer that has spread to the bone. This medicine may be used for other purposes; ask your health care provider or pharmacist if you have questions. What should I tell my health care provider before I take this medicine? They need to know if you have any of these conditions: -aspirin-sensitive asthma -dental disease -kidney disease -an unusual or allergic reaction to zoledronic acid, other medicines, foods, dyes, or preservatives -pregnant or trying to get pregnant -breast-feeding How should I use this medicine? This medicine is for infusion into a vein. It is given by a health care professional in a hospital or clinic setting. Talk to your pediatrician regarding the use of this medicine in children. Special care may be needed. Overdosage: If you think you have taken too much of this medicine contact a poison control  center or emergency room at once. NOTE: This medicine is only for you. Do not share this medicine with others. What if I miss a dose? It is important not to miss your dose. Call your doctor or health care professional if you are unable to keep an appointment. What may interact with this medicine? -certain antibiotics given by injection -NSAIDs, medicines for pain and  inflammation, like ibuprofen or naproxen -some diuretics like bumetanide, furosemide -teriparatide -thalidomide This list may not describe all possible interactions. Give your health care provider a list of all the medicines, herbs, non-prescription drugs, or dietary supplements you use. Also tell them if you smoke, drink alcohol, or use illegal drugs. Some items may interact with your medicine. What should I watch for while using this medicine? Visit your doctor or health care professional for regular checkups. It may be some time before you see the benefit from this medicine. Do not stop taking your medicine unless your doctor tells you to. Your doctor may order blood tests or other tests to see how you are doing. Women should inform their doctor if they wish to become pregnant or think they might be pregnant. There is a potential for serious side effects to an unborn child. Talk to your health care professional or pharmacist for more information. You should make sure that you get enough calcium and vitamin D while you are taking this medicine. Discuss the foods you eat and the vitamins you take with your health care professional. Some people who take this medicine have severe bone, joint, and/or muscle pain. This medicine may also increase your risk for a broken thigh bone. Tell your doctor right away if you have pain in your upper leg or groin. Tell your doctor if you have any pain that does not go away or that gets worse. What side effects may I notice from receiving this medicine? Side effects that you should report to your doctor or health care professional as soon as possible: -allergic reactions like skin rash, itching or hives, swelling of the face, lips, or tongue -anxiety, confusion, or depression -breathing problems -changes in vision -feeling faint or lightheaded, falls -jaw burning, cramping, pain -muscle cramps, stiffness, or weakness -trouble passing urine or change in the amount  of urine Side effects that usually do not require medical attention (report to your doctor or health care professional if they continue or are bothersome): -bone, joint, or muscle pain -fever -hair loss -irritation at site where injected -loss of appetite -nausea, vomiting -stomach upset -tired This list may not describe all possible side effects. Call your doctor for medical advice about side effects. You may report side effects to FDA at 1-800-FDA-1088. Where should I keep my medicine? This drug is given in a hospital or clinic and will not be stored at home. NOTE: This sheet is a summary. It may not cover all possible information. If you have questions about this medicine, talk to your doctor, pharmacist, or health care provider.  2013, [employer]vier/Gold Standard. (11/23/2010 9:06:58 AM)

## 2012-12-17 NOTE — Progress Notes (Signed)
Diagnosis: Metastatic breast cancer.  Past therapy: #1.  Abraxane x10 cycles  Current therapy: #1.  Status post 1 cycle of Doxil #2.  Zometa 4 mg IV monthly.  Interim history: , Anna Benjamin presents today for an office followup visit.  Unfortunately, with respect to her last PET scan, she did have some progressive metastatic disease mainly within the right hepatic lobe.  She was on the Abraxane and completed 10 cycles prior to her progression.We have now switched her over to Doxil.  She's completed her first cycle.  Overall she reports that she's done relatively well except for some fatigue, some irritation and redness on the ball of her left great toe.  She states that she just recently started using the Utter cream to keep her hands and feet moisturized.  She does have some tenderness in this area.  It does appear to be the beginning of the hand foot syndrome.  She does not have any type of peeling.  She's not reporting any fevers.  I am probably going her on doxycycline.  She otherwise reports, that she has an excellent appetite.  She denies any nausea, vomiting, diarrhea, constipation, chest pain, shortness of breath, or cough.  She denies any fevers, chills, or night sweats.  She denies any lower leg swelling.  She denies any abdominal pain.  She denies any obvious, or abnormal bleeding.  She denies any headaches, visual changes, or rashes.  She denies any dysphasia or odynophagia.  She denies any type of neuropathy.  Of note, she did mention that she had a small mouth sore on the side of her lip which resolved.  I did advise her to swish with a salt and baking soda rinse.  I advised her to get over-the-counter Biotene.  I advised her to call us if she should have any more mouth sores and we can call her in a prescription mouth rinse.  She also receives Zometa on a monthly basis.  She is doing well with this.  There are no signs and symptoms of osteonecrosis of the jaw.  She did have a 2-D echo done  which revealed a left ventricular ejection fraction of 50-55%.  Review of Systems: Constitutional:Negative for malaise/fatigue, fever, chills, weight loss, diaphoresis, activity change, appetite change, and unexpected weight change.  HEENT: Negative for double vision, blurred vision, visual loss, ear pain, tinnitus, congestion, rhinorrhea, epistaxis sore throat or sinus disease, oral pain/lesion, tongue soreness Respiratory: Negative for cough, chest tightness, shortness of breath, wheezing and stridor.  Cardiovascular: Negative for chest pain, palpitations, leg swelling, orthopnea, PND, DOE or claudication Gastrointestinal: Negative for nausea, vomiting, abdominal pain, diarrhea, constipation, blood in stool, melena, hematochezia, abdominal distention, anal bleeding, rectal pain, anorexia and hematemesis.  Genitourinary: Negative for dysuria, frequency, hematuria,  Musculoskeletal: Negative for myalgias, back pain, joint swelling, arthralgias and gait problem.  Skin: Negative for rash, color change, pallor and wound.  Neurological:. Negative for dizziness/light-headedness, tremors, seizures, syncope, facial asymmetry, speech difficulty, weakness, numbness, headaches and paresthesias.  Hematological: Negative for adenopathy. Does not bruise/bleed easily.  Psychiatric/Behavioral:  Negative for depression, no loss of interest in normal activity or change in sleep pattern.   Physical Exam: This is a 77 year old, petite, white female, in no obvious distress Vitals: Temperature 97.7 degrees pulse 77 respirations 16 blood pressure 125/67 weight 120 pounds HEENT reveals a normocephalic, atraumatic skull, no scleral icterus, no oral lesions  Neck is supple without any cervical or supraclavicular adenopathy.  Lungs are clear to auscultation bilaterally. There  are no wheezes, rales or rhonci Cardiac is regular rate and rhythm with a normal S1 and S2. There are no murmurs, rubs, or bruits.  Abdomen is soft  with good bowel sounds, there is no palpable mass. There is no palpable hepatosplenomegaly. There is no palpable fluid wave.  Musculoskeletal no tenderness of the spine, ribs, or hips.  Extremities there are no clubbing, cyanosis, or edema.  She does have some erythema as well as a small 1 cm blister on the ball of her left great toe.  There is some tenderness to palpation.  There is no peeling. Skin no petechia, purpura or ecchymosis Neurologic is nonfocal.  Laboratory Data: White count 8.3 hemoglobin 13.0 hematocrit 39.7 platelets 215,000   Radiography: PET SCAN 10/28/2012  Impression:  ,#1.  3 lesions in the right hepatic lobe are now hypermetabolic, versus 2 lesions on the prior study. #2.  Foci of osseous hypermetabolism appears progressive while the CT appearance appears stable.  Findings may be treatment related.  Difficult to exclude progression of disease.  2-D echocardiogram 11/18/2012 #1 left ventricular ejection fraction is between 50-55%.    Current Outpatient Prescriptions on File Prior to Visit  Medication Sig Dispense Refill  . aspirin 81 MG chewable tablet Chew 81 mg by mouth daily.        . Calcium Carbonate-Vit D-Min (CALCIUM 1200 PO) Take by mouth every morning.      . cholecalciferol (VITAMIN D) 1000 UNITS tablet Take 1,000 Units by mouth daily.        Marland Kitchen dexamethasone (DECADRON) 4 MG tablet Take 2 tablets by mouth daily starting the day after chemotherapy for 2 days. Take with food.  30 tablet  1  . Dextromethorphan-Guaifenesin (MUCINEX DM MAXIMUM STRENGTH) 60-1200 MG TB12 Take by mouth 2 (two) times daily.      [name] Sodium (COLACE PO) Take by mouth every morning. 3 po q day.      . fexofenadine (ALLEGRA) 180 MG tablet Take 180 mg by mouth daily.        . fluticasone (FLONASE) 50 MCG/ACT nasal spray Place 2 sprays into the nose daily.  16 g  3  . HYDROcodone-acetaminophen (NORCO) 10-325 MG per tablet 1 - 2 tabs by mouth every 6 hours as needed for pain secondary  to cancer.  180 tablet  0  . lisinopril-hydrochlorothiazide (PRINZIDE,ZESTORETIC) 10-12.5 MG per tablet TAKE 1 TABLET BY MOUTH DAILY  90 tablet  0  . LORazepam (ATIVAN) 0.5 MG tablet 0.5 mg as needed.       . megestrol (MEGACE) 40 MG tablet Take 1 tablet (40 mg total) by mouth daily.  30 tablet  5  . omeprazole (PRILOSEC) 20 MG capsule TAKE 1 CAPSULE BY MOUTH EVERY DAY  30 capsule  2  . omeprazole (PRILOSEC) 20 MG capsule TAKE 1 CAPSULE BY MOUTH EVERY DAY  30 capsule  2  . ondansetron (ZOFRAN) 8 MG tablet as needed.      . prochlorperazine (COMPAZINE) 10 MG tablet Take 10 mg by mouth as needed.       Assessment/Plan: This is a pleasant, 77 year old, white female, with the following issues:  #1. Progressive metastatic breast cancer.  She is now on Doxil and has completed her first cycle.  Overall she tolerated it relatively well.  We will continue to keep a close eye on her skin.  She will go ahead and proceed with cycle 2 today.  #2.  Hand foot syndrome.  This is secondary to the  Doxil.  This is not quite a grade 1 syndrome.  She does have some erythema and a blister on the ball of her left great toe.  I did advise her to religiously use the other cream on her hands and feet on a daily basis.  I also going to go ahead and prescribe doxycycline 100 mg by mouth twice a day.  I advised her to keep a close wife on her symptoms and to give [place] a call if they persisted.  #3.  Insomnia.  .  She remains on Restoril.    #4.  Supportive therapy.  She continues to receive Zometa monthly.  #5.  Followup.  We will follow back up with Anna Benjamin in about a month, but before then should there be questions or concerns.

## 2012-12-28 ENCOUNTER — Other Ambulatory Visit: Payer: Self-pay | Admitting: *Deleted

## 2012-12-28 MED ORDER — TEMAZEPAM 15 MG PO CAPS: 15.0000 mg | ORAL_CAPSULE | Freq: Every evening | ORAL | Status: DC | PRN

## 2013-01-13 ENCOUNTER — Other Ambulatory Visit: Payer: Self-pay

## 2013-01-14 ENCOUNTER — Other Ambulatory Visit (HOSPITAL_BASED_OUTPATIENT_CLINIC_OR_DEPARTMENT_OTHER): Payer: Medicare Other | Admitting: Lab

## 2013-01-14 ENCOUNTER — Ambulatory Visit (HOSPITAL_BASED_OUTPATIENT_CLINIC_OR_DEPARTMENT_OTHER): Payer: Medicare Other | Admitting: Hematology & Oncology

## 2013-01-14 ENCOUNTER — Ambulatory Visit (HOSPITAL_BASED_OUTPATIENT_CLINIC_OR_DEPARTMENT_OTHER): Payer: Medicare Other

## 2013-01-14 VITALS — BP 118/53 | HR 83 | Temp 98.3°F | Resp 16 | Ht 60.0 in | Wt 121.0 lb

## 2013-01-14 DIAGNOSIS — Z5111 Encounter for antineoplastic chemotherapy: Secondary | ICD-10-CM

## 2013-01-14 DIAGNOSIS — C50119 Malignant neoplasm of central portion of unspecified female breast: Secondary | ICD-10-CM

## 2013-01-14 DIAGNOSIS — C50919 Malignant neoplasm of unspecified site of unspecified female breast: Secondary | ICD-10-CM

## 2013-01-14 DIAGNOSIS — C7951 Secondary malignant neoplasm of bone: Secondary | ICD-10-CM

## 2013-01-14 DIAGNOSIS — M810 Age-related osteoporosis without current pathological fracture: Secondary | ICD-10-CM

## 2013-01-14 DIAGNOSIS — C787 Secondary malignant neoplasm of liver and intrahepatic bile duct: Secondary | ICD-10-CM

## 2013-01-14 DIAGNOSIS — C7952 Secondary malignant neoplasm of bone marrow: Secondary | ICD-10-CM

## 2013-01-14 LAB — CMP (CANCER CENTER ONLY)
AST: 26 U/L (ref 11–38)
Albumin: 3.7 g/dL (ref 3.3–5.5)
Alkaline Phosphatase: 79 U/L (ref 26–84)
Chloride: 103 mEq/L (ref 98–108)
Glucose, Bld: 109 mg/dL (ref 73–118)
Potassium: 3.7 mEq/L (ref 3.3–4.7)
Sodium: 138 mEq/L (ref 128–145)
Total Protein: 6.3 g/dL — ABNORMAL LOW (ref 6.4–8.1)

## 2013-01-14 LAB — CBC WITH DIFFERENTIAL (CANCER CENTER ONLY)
BASO#: 0 10*3/uL (ref 0.0–0.2)
Eosinophils Absolute: 0 10*3/uL (ref 0.0–0.5)
HGB: 12.5 g/dL (ref 11.6–15.9)
MCH: 33 pg (ref 26.0–34.0)
MONO%: 10.5 % (ref 0.0–13.0)
NEUT#: 4.4 10*3/uL (ref 1.5–6.5)
RBC: 3.79 10*6/uL (ref 3.70–5.32)

## 2013-01-14 MED ORDER — ONDANSETRON 8 MG/50ML IVPB (CHCC)
8.0000 mg | Freq: Once | INTRAVENOUS | Status: AC
  Administered 2013-01-14: 8 mg via INTRAVENOUS

## 2013-01-14 MED ORDER — ZOLEDRONIC ACID 4 MG/100ML IV SOLN
4.0000 mg | Freq: Once | INTRAVENOUS | Status: AC
  Administered 2013-01-14: 4 mg via INTRAVENOUS
  Filled 2013-01-14: qty 100

## 2013-01-14 MED ORDER — SODIUM CHLORIDE 0.9 % IV SOLN
Freq: Once | INTRAVENOUS | Status: AC
  Administered 2013-01-14: 14:00:00 via INTRAVENOUS

## 2013-01-14 MED ORDER — DOXORUBICIN HCL LIPOSOMAL CHEMO INJECTION 2 MG/ML
60.0000 mg | Freq: Once | INTRAVENOUS | Status: AC
  Administered 2013-01-14: 60 mg via INTRAVENOUS
  Filled 2013-01-14: qty 30

## 2013-01-14 MED ORDER — DEXAMETHASONE SODIUM PHOSPHATE 10 MG/ML IJ SOLN
10.0000 mg | Freq: Once | INTRAMUSCULAR | Status: AC
  Administered 2013-01-14: 10 mg via INTRAVENOUS

## 2013-01-14 NOTE — Patient Instructions (Addendum)
[facility] Discharge Instructions for Patients Receiving Chemotherapy  Today you received the following chemotherapy agents Doxil  To help prevent nausea and vomiting after your treatment, we encourage you to take your nausea medication    If you develop nausea and vomiting that is not controlled by your nausea medication, call the clinic.   BELOW ARE SYMPTOMS THAT SHOULD BE REPORTED IMMEDIATELY:  *FEVER GREATER THAN 100.5 F  *CHILLS WITH OR WITHOUT FEVER  NAUSEA AND VOMITING THAT IS NOT CONTROLLED WITH YOUR NAUSEA MEDICATION  *UNUSUAL SHORTNESS OF BREATH  *UNUSUAL BRUISING OR BLEEDING  TENDERNESS IN MOUTH AND THROAT WITH OR WITHOUT PRESENCE OF ULCERS  *URINARY PROBLEMS  *BOWEL PROBLEMS  UNUSUAL RASH Items with * indicate a potential emergency and should be followed up as soon as possible.  Feel free to call the clinic you have any questions or concerns. The clinic phone number is [phone].    

## 2013-01-14 NOTE — Progress Notes (Signed)
This office note has been dictated.

## 2013-01-15 NOTE — Progress Notes (Signed)
CC:   Anna Perla, DO  DIAGNOSIS:  Metastatic breast cancer.  CURRENT THERAPY: 1. Doxil monthly. 2. Zometa 4 mg IV monthly.  INTERIM HISTORY:  Anna Benjamin comes in for followup.  She is looking quite good.  She has had no specific complaints.  She has tolerated treatment nicely.  She has not had any nausea or vomiting.  Pain wise she seems to be doing quite well.  She is on Vicodin for her pain but does not seem to be taking more than 2 or 3 day.  She has had a decent appetite.  She is on low-dose Megace for appetite and also for some hot flashes.  We follow her CA27.29.  This has been on the up side.  It was 877 when we checked it in July.  Again, performance status seems real good.  She has had no pain and swelling in her hands or feet.  There has been no burning.  PHYSICAL EXAMINATION:  General:  This is a well-developed, well- nourished white female in no obvious distress.  Vital signs: Temperature of 98.3, pulse 83, respiratory rate 16, blood pressure 118/53.  Weight is 121.  Head and neck:  Normocephalic, atraumatic skull.  There are no ocular or oral lesions.  There are no palpable cervical or supraclavicular lymph nodes.  Lungs:  Clear bilaterally. Cardiac:  Regular rate and rhythm with a normal S1, S2.  There are no murmurs, rubs or bruits.  Abdomen:  Soft.  She has good bowel sounds. There is no fluid wave.  There is no palpable hepatosplenomegaly. Extremities:  Show no clubbing, cyanosis or edema.  She has good range motion of her joints.  Neurological:  Shows no focal neurological deficits.  LABORATORY STUDIES:  White cell count is 7.5, hemoglobin 12.5, hematocrit 37.7, platelet count 177.  IMPRESSION:  Anna Benjamin is a 77 year old white female with metastatic breast cancer.  She has done nicely so far.  We have been following her for about 4 or 5 years.  For now, we will go ahead and treat her.  She has some financial stress and she says she really  cannot do a PET scan right now.  We will plan on doing a PET scan after her next cycle of chemo.  This will be her fourth cycle.  We will get her back in a month.  Her quality of life is quite good right now, which is what is important to her.    ______________________________ Josph Macho, M.D. PRE/MEDQ  D:  01/14/2013  T:  01/15/2013  Job:  [id number]

## 2013-02-03 NOTE — Progress Notes (Signed)
This encounter was created in error - please disregard.

## 2013-02-11 ENCOUNTER — Other Ambulatory Visit (HOSPITAL_BASED_OUTPATIENT_CLINIC_OR_DEPARTMENT_OTHER): Payer: Medicare Other | Admitting: Lab

## 2013-02-11 ENCOUNTER — Ambulatory Visit (HOSPITAL_BASED_OUTPATIENT_CLINIC_OR_DEPARTMENT_OTHER): Payer: Medicare Other

## 2013-02-11 ENCOUNTER — Ambulatory Visit (HOSPITAL_BASED_OUTPATIENT_CLINIC_OR_DEPARTMENT_OTHER): Payer: Medicare Other | Admitting: Hematology & Oncology

## 2013-02-11 VITALS — BP 137/55 | HR 83 | Temp 98.3°F | Resp 16 | Ht 61.0 in | Wt 123.0 lb

## 2013-02-11 DIAGNOSIS — Z5111 Encounter for antineoplastic chemotherapy: Secondary | ICD-10-CM

## 2013-02-11 DIAGNOSIS — C787 Secondary malignant neoplasm of liver and intrahepatic bile duct: Secondary | ICD-10-CM

## 2013-02-11 DIAGNOSIS — C50919 Malignant neoplasm of unspecified site of unspecified female breast: Secondary | ICD-10-CM

## 2013-02-11 DIAGNOSIS — C7951 Secondary malignant neoplasm of bone: Secondary | ICD-10-CM

## 2013-02-11 DIAGNOSIS — C50119 Malignant neoplasm of central portion of unspecified female breast: Secondary | ICD-10-CM

## 2013-02-11 LAB — CBC WITH DIFFERENTIAL (CANCER CENTER ONLY)
BASO%: 0.4 % (ref 0.0–2.0)
HCT: 37.2 % (ref 34.8–46.6)
LYMPH%: 35.9 % (ref 14.0–48.0)
MCH: 33.3 pg (ref 26.0–34.0)
MCHC: 33.3 g/dL (ref 32.0–36.0)
MCV: 100 fL (ref 81–101)
MONO%: 13.4 % — ABNORMAL HIGH (ref 0.0–13.0)
NEUT%: 49.9 % (ref 39.6–80.0)
Platelets: 205 10*3/uL (ref 145–400)
RDW: 12.6 % (ref 11.1–15.7)

## 2013-02-11 LAB — CMP (CANCER CENTER ONLY)
ALT(SGPT): 31 U/L (ref 10–47)
AST: 35 U/L (ref 11–38)
BUN, Bld: 18 mg/dL (ref 7–22)
CO2: 28 mEq/L (ref 18–33)
Calcium: 9.5 mg/dL (ref 8.0–10.3)
Chloride: 103 mEq/L (ref 98–108)
Creat: 0.8 mg/dl (ref 0.6–1.2)
Total Bilirubin: 0.7 mg/dl (ref 0.20–1.60)

## 2013-02-11 MED ORDER — ONDANSETRON 8 MG/50ML IVPB (CHCC)
8.0000 mg | Freq: Once | INTRAVENOUS | Status: AC
  Administered 2013-02-11: 8 mg via INTRAVENOUS

## 2013-02-11 MED ORDER — DOXORUBICIN HCL LIPOSOMAL CHEMO INJECTION 2 MG/ML
60.0000 mg | Freq: Once | INTRAVENOUS | Status: AC
  Administered 2013-02-11: 60 mg via INTRAVENOUS
  Filled 2013-02-11: qty 30

## 2013-02-11 MED ORDER — DEXAMETHASONE SODIUM PHOSPHATE 10 MG/ML IJ SOLN
10.0000 mg | Freq: Once | INTRAMUSCULAR | Status: AC
  Administered 2013-02-11: 10 mg via INTRAVENOUS

## 2013-02-11 MED ORDER — SODIUM CHLORIDE 0.9 % IV SOLN
Freq: Once | INTRAVENOUS | Status: AC
  Administered 2013-02-11: 13:00:00 via INTRAVENOUS

## 2013-02-11 MED ORDER — SODIUM CHLORIDE 0.9 % IV SOLN: Freq: Once | INTRAVENOUS | Status: DC

## 2013-02-11 MED ORDER — ZOLEDRONIC ACID 4 MG/100ML IV SOLN
4.0000 mg | Freq: Once | INTRAVENOUS | Status: AC
  Administered 2013-02-11: 4 mg via INTRAVENOUS
  Filled 2013-02-11: qty 100

## 2013-02-11 NOTE — Patient Instructions (Signed)
[facility] Discharge Instructions for Patients Receiving Chemotherapy  Today you received the following chemotherapy agents Doxil  To help prevent nausea and vomiting after your treatment, we encourage you to take your nausea medication    If you develop nausea and vomiting that is not controlled by your nausea medication, call the clinic.   BELOW ARE SYMPTOMS THAT SHOULD BE REPORTED IMMEDIATELY:  *FEVER GREATER THAN 100.5 F  *CHILLS WITH OR WITHOUT FEVER  NAUSEA AND VOMITING THAT IS NOT CONTROLLED WITH YOUR NAUSEA MEDICATION  *UNUSUAL SHORTNESS OF BREATH  *UNUSUAL BRUISING OR BLEEDING  TENDERNESS IN MOUTH AND THROAT WITH OR WITHOUT PRESENCE OF ULCERS  *URINARY PROBLEMS  *BOWEL PROBLEMS  UNUSUAL RASH Items with * indicate a potential emergency and should be followed up as soon as possible.  Feel free to call the clinic you have any questions or concerns. The clinic phone number is [phone].    

## 2013-02-11 NOTE — Progress Notes (Signed)
This office note has been dictated.

## 2013-02-12 LAB — CANCER ANTIGEN 27.29: CA 27.29: 1618 U/mL — ABNORMAL HIGH (ref 0–39)

## 2013-02-12 NOTE — Progress Notes (Signed)
CC:   Anna Perla, DO  DIAGNOSIS:  Metastatic breast cancer.  CURRENT THERAPY: 1. Monthly Doxil. 2. Zometa 4 mg IV q, month.  INTERIM HISTORY:  Anna  Benjamin comes in for her monthly followup.  She is doing okay.  She is pretty much asymptomatic with respect to her malignancy.  Unfortunately, her CA27-29 continues to rise.  In July it was 877.  In August it was about 1300.  Because of financial issues, it has been hard for her to get scans as we need to.  However, I really think we are going to need to get a scan to see if she has progressed.  One would have to think that she is progressing.  If so, then I would probably consider her for Xeloda.  She has had no pain in her hands or feet.  There has been no skin peeling.  She may be a little more sensitive to hot water.  There have been no problems with bowels or bladder.  She has had no nausea or vomiting.  There has been some fatigue.  She has had no cough.  She has had no headache.  We last did an echocardiogram on her back in June.  This showed an ejection fraction of 50% to 55%.  PHYSICAL EXAMINATION:  General:  This is a petite white female in no obvious distress.  Vital Signs:  Temperature of 98.3, pulse 83, respiratory rate 16, blood pressure 127/55.  Weight is 123.  Head and Neck:  Normocephalic, atraumatic skull.  There are no ocular or oral lesions.  There is no scleral icterus.  There is no adenopathy in the neck.  Lungs:  Clear bilaterally.  Cardiac:  Regular rate and rhythm with a normal S1 and S2.  There are no murmurs, rubs or bruits. Abdomen:  Soft.  She has good bowel sounds.  There is no fluid wave. There is no palpable hepatosplenomegaly.  Extremities:  Show no clubbing, cyanosis or edema.  Neurological:  Exam shows no focal neurological deficits.  LABORATORY STUDIES:  White cell count 8.3, hemoglobin 12.4, hematocrit 37.2, platelet count 205.  IMPRESSION:  Anna Benjamin is a very charming 77 year old  white female with metastatic breast cancer.  She has, I think, liver metastases and bone metastases.  She is pretty much asymptomatic with these, which is nice to see.  Again, we really have to get a PET scan on her now.  We will go ahead and get one set up for her.  We will go ahead and plan to get this in 2 weeks.  I will see her back in a month.  Again, if we find that she is progressing, which I think she is, then I would switch her over to oral Xeloda.    ______________________________ Anna Benjamin, M.D. PRE/MEDQ  D:  02/11/2013  T:  02/12/2013  Job:  [id number]

## 2013-03-03 ENCOUNTER — Encounter (HOSPITAL_COMMUNITY)
Admission: RE | Admit: 2013-03-03 | Discharge: 2013-03-03 | Disposition: A | Discharge status: Home / Self Care | Payer: Medicare Other | Source: Ambulatory Visit | Attending: Hematology & Oncology | Admitting: Hematology & Oncology

## 2013-03-03 DIAGNOSIS — M439 Deforming dorsopathy, unspecified: Secondary | ICD-10-CM | POA: Insufficient documentation

## 2013-03-03 DIAGNOSIS — Z9221 Personal history of antineoplastic chemotherapy: Secondary | ICD-10-CM | POA: Insufficient documentation

## 2013-03-03 DIAGNOSIS — C787 Secondary malignant neoplasm of liver and intrahepatic bile duct: Secondary | ICD-10-CM | POA: Insufficient documentation

## 2013-03-03 DIAGNOSIS — C50919 Malignant neoplasm of unspecified site of unspecified female breast: Secondary | ICD-10-CM | POA: Insufficient documentation

## 2013-03-03 DIAGNOSIS — Z90710 Acquired absence of both cervix and uterus: Secondary | ICD-10-CM | POA: Insufficient documentation

## 2013-03-03 DIAGNOSIS — I517 Cardiomegaly: Secondary | ICD-10-CM | POA: Insufficient documentation

## 2013-03-03 DIAGNOSIS — C7951 Secondary malignant neoplasm of bone: Secondary | ICD-10-CM | POA: Insufficient documentation

## 2013-03-03 MED ORDER — FLUDEOXYGLUCOSE F - 18 (FDG) INJECTION
17.2000 | Freq: Once | INTRAVENOUS | Status: AC | PRN
  Administered 2013-03-03: 17.2 via INTRAVENOUS

## 2013-03-09 NOTE — Progress Notes (Signed)
Dr. Myna Hidalgo has spoken to patient.

## 2013-03-11 ENCOUNTER — Ambulatory Visit (HOSPITAL_BASED_OUTPATIENT_CLINIC_OR_DEPARTMENT_OTHER): Payer: Medicare Other

## 2013-03-11 ENCOUNTER — Other Ambulatory Visit: Payer: Self-pay | Admitting: *Deleted

## 2013-03-11 ENCOUNTER — Other Ambulatory Visit (HOSPITAL_BASED_OUTPATIENT_CLINIC_OR_DEPARTMENT_OTHER): Payer: Medicare Other | Admitting: Lab

## 2013-03-11 ENCOUNTER — Ambulatory Visit (HOSPITAL_BASED_OUTPATIENT_CLINIC_OR_DEPARTMENT_OTHER): Payer: Medicare Other | Admitting: Hematology & Oncology

## 2013-03-11 VITALS — BP 134/60 | HR 92 | Temp 98.0°F | Resp 14 | Ht 61.0 in | Wt 120.0 lb

## 2013-03-11 DIAGNOSIS — C50119 Malignant neoplasm of central portion of unspecified female breast: Secondary | ICD-10-CM

## 2013-03-11 DIAGNOSIS — C787 Secondary malignant neoplasm of liver and intrahepatic bile duct: Secondary | ICD-10-CM

## 2013-03-11 DIAGNOSIS — F419 Anxiety disorder, unspecified: Secondary | ICD-10-CM

## 2013-03-11 DIAGNOSIS — C7951 Secondary malignant neoplasm of bone: Secondary | ICD-10-CM

## 2013-03-11 DIAGNOSIS — C50919 Malignant neoplasm of unspecified site of unspecified female breast: Secondary | ICD-10-CM

## 2013-03-11 LAB — CMP (CANCER CENTER ONLY)
ALT(SGPT): 32 U/L (ref 10–47)
AST: 37 U/L (ref 11–38)
Alkaline Phosphatase: 91 U/L — ABNORMAL HIGH (ref 26–84)
BUN, Bld: 19 mg/dL (ref 7–22)
Chloride: 99 mEq/L (ref 98–108)
Creat: 1 mg/dl (ref 0.6–1.2)
Total Bilirubin: 0.9 mg/dl (ref 0.20–1.60)

## 2013-03-11 LAB — CBC WITH DIFFERENTIAL (CANCER CENTER ONLY)
BASO#: 0 10*3/uL (ref 0.0–0.2)
BASO%: 0.2 % (ref 0.0–2.0)
HGB: 12.3 g/dL (ref 11.6–15.9)
LYMPH%: 21.1 % (ref 14.0–48.0)
MCV: 101 fL (ref 81–101)
MONO#: 0.9 10*3/uL (ref 0.1–0.9)
MONO%: 10.5 % (ref 0.0–13.0)
NEUT%: 68 % (ref 39.6–80.0)
Platelets: 211 10*3/uL (ref 145–400)
RDW: 12.5 % (ref 11.1–15.7)
WBC: 8.1 10*3/uL (ref 3.9–10.0)

## 2013-03-11 LAB — CANCER ANTIGEN 27.29: CA 27.29: 2218 U/mL — ABNORMAL HIGH (ref 0–39)

## 2013-03-11 MED ORDER — SODIUM CHLORIDE 0.9 % IV SOLN
Freq: Once | INTRAVENOUS | Status: AC
  Administered 2013-03-11: 13:00:00 via INTRAVENOUS

## 2013-03-11 MED ORDER — CAPECITABINE 500 MG PO TABS: 1500.0000 mg | ORAL_TABLET | Freq: Two times a day (BID) | ORAL | Status: DC

## 2013-03-11 MED ORDER — ZOLEDRONIC ACID 4 MG/100ML IV SOLN
4.0000 mg | Freq: Once | INTRAVENOUS | Status: AC
  Administered 2013-03-11: 4 mg via INTRAVENOUS
  Filled 2013-03-11: qty 100

## 2013-03-11 MED ORDER — CAPECITABINE 500 MG PO TABS: ORAL_TABLET | ORAL | Status: DC

## 2013-03-11 MED ORDER — HYDROCODONE-ACETAMINOPHEN 10-325 MG PO TABS: ORAL_TABLET | ORAL | Status: DC

## 2013-03-11 NOTE — Patient Instructions (Signed)
Zoledronic Acid injection (Hypercalcemia, Oncology) What is this medicine? ZOLEDRONIC ACID (ZOE le dron ik AS id) lowers the amount of calcium loss from bone. It is used to treat too much calcium in your blood from cancer. It is also used to prevent complications of cancer that has spread to the bone. This medicine may be used for other purposes; ask your health care provider or pharmacist if you have questions. What should I tell my health care provider before I take this medicine? They need to know if you have any of these conditions: -aspirin-sensitive asthma -dental disease -kidney disease -an unusual or allergic reaction to zoledronic acid, other medicines, foods, dyes, or preservatives -pregnant or trying to get pregnant -breast-feeding How should I use this medicine? This medicine is for infusion into a vein. It is given by a health care professional in a hospital or clinic setting. Talk to your pediatrician regarding the use of this medicine in children. Special care may be needed. Overdosage: If you think you have taken too much of this medicine contact a poison control center or emergency room at once. NOTE: This medicine is only for you. Do not share this medicine with others. What if I miss a dose? It is important not to miss your dose. Call your doctor or health care professional if you are unable to keep an appointment. What may interact with this medicine? -certain antibiotics given by injection -NSAIDs, medicines for pain and inflammation, like ibuprofen or naproxen -some diuretics like bumetanide, furosemide -teriparatide -thalidomide This list may not describe all possible interactions. Give your health care provider a list of all the medicines, herbs, non-prescription drugs, or dietary supplements you use. Also tell them if you smoke, drink alcohol, or use illegal drugs. Some items may interact with your medicine. What should I watch for while using this medicine? Visit  your doctor or health care professional for regular checkups. It may be some time before you see the benefit from this medicine. Do not stop taking your medicine unless your doctor tells you to. Your doctor may order blood tests or other tests to see how you are doing. Women should inform their doctor if they wish to become pregnant or think they might be pregnant. There is a potential for serious side effects to an unborn child. Talk to your health care professional or pharmacist for more information. You should make sure that you get enough calcium and vitamin D while you are taking this medicine. Discuss the foods you eat and the vitamins you take with your health care professional. Some people who take this medicine have severe bone, joint, and/or muscle pain. This medicine may also increase your risk for a broken thigh bone. Tell your doctor right away if you have pain in your upper leg or groin. Tell your doctor if you have any pain that does not go away or that gets worse. What side effects may I notice from receiving this medicine? Side effects that you should report to your doctor or health care professional as soon as possible: -allergic reactions like skin rash, itching or hives, swelling of the face, lips, or tongue -anxiety, confusion, or depression -breathing problems -changes in vision -feeling faint or lightheaded, falls -jaw burning, cramping, pain -muscle cramps, stiffness, or weakness -trouble passing urine or change in the amount of urine Side effects that usually do not require medical attention (report to your doctor or health care professional if they continue or are bothersome): -bone, joint, or muscle pain -  fever -hair loss -irritation at site where injected -loss of appetite -nausea, vomiting -stomach upset -tired This list may not describe all possible side effects. Call your doctor for medical advice about side effects. You may report side effects to FDA at  1-800-FDA-1088. Where should I keep my medicine? This drug is given in a hospital or clinic and will not be stored at home. NOTE: This sheet is a summary. It may not cover all possible information. If you have questions about this medicine, talk to your doctor, pharmacist, or health care provider.  2013, [employer]. (11/23/2010 9:06:58 AM)  

## 2013-03-11 NOTE — Progress Notes (Signed)
This office note has been dictated.

## 2013-03-11 NOTE — Telephone Encounter (Signed)
While pt was here for an appt she stated she was in need of a Norco refill. Called in to [facility] as this is a chronic med. This will be the last rx that we be allowed to be called in as it will be come a CII on 03/15/13. Pt is aware.

## 2013-03-12 NOTE — Progress Notes (Signed)
CC:   Lelon Perla, DO  DIAGNOSIS:  Progressive metastatic breast cancer.  CURRENT THERAPY: 1. Patient to start Xeloda. 2. Zometa 4 mg IV monthly.  INTERIM HISTORY:  [name] comes in for followup.  Unfortunately, her breast cancer is progressing.  She has had financial issues that we have not be able to scan her all that often.  We did go ahead and finally get a PET scan on her.  This was done on September 24th.  The CT scan showed marked progression of hepatic metastasis.  She has a moderate to marked progression of osseous metastasis.  This all is consistent with her breast cancer getting worse.  Her CA 27.29 is elevated at 1618.  Despite the progressive disease on her scan and the lab work, she is doing okay.  She is not having any abdominal pain.  She has bony pain, which we use Vicodin for.  This has helped her quite a bit.  Her weight continues to drop a little bit.  She is down to 120 pounds now.  She has had no cough.  There has been no headache.  She has had no change in bowel or bladder habits.  She has been a little constipated. There has been no leg swelling.  She has not noticed any kind of rashes.  She did note a place on her right flank.  She thought she was bitten by an insect.  This bothers her quite a bit.  She says it finally is starting to get better.  She has had no nausea or vomiting.  There has been no bleeding.  Overall, her performance status is ECOG 1.  PHYSICAL EXAMINATION:  General:  This is a petite white female in no obvious distress.  Vital signs:  Temperature of 98, pulse 92, respiratory rate 14, blood pressure 134/60.  Weight is 120 pounds.  Head and neck:  Normocephalic, atraumatic skull.  There are no ocular or oral lesions.  There are no palpable cervical or supraclavicular lymph nodes. Lungs:  Clear bilaterally.  Cardiac:  Regular rate and rhythm with a normal S1 and S2.  There are no murmurs, rubs or bruits.  Abdomen: Soft.   She has good bowel sounds.  There is no fluid wave.  There is no palpable abdominal mass.  There is no palpable hepatosplenomegaly. Back.  No tenderness over the spine, ribs, or hips.  Extremities:  No clubbing, cyanosis or edema.  Neurologic:  No focal neurological deficits.  Skin:  No rashes, ecchymosis, or petechia.  LABORATORY STUDIES:  White cell count is 8.1, hemoglobin 12.3, hematocrit 37.7, platelet count 211.  Calcium is 9.8 with an albumin of 3.9.  LFTs are normal.  IMPRESSION:  Anna Benjamin is a nice 78 year old white female.  She has done incredibly well up to now.  We have been following her now probably for about 3 or 4 years.  Her disease has been slowly progressing along. She typically responds to new therapy once she is progressing.  We did get some "mileage" out of Doxil.  However, we need to switch therapy on her.  I think a good option for her would be Xeloda.  She will not have to pay for the Xeloda if she gets the medicine at the pharmacy downstairs. This will be a huge bonus for [place].  I will dose her Xeloda at 1500 mg twice a day for 14 days on and 7 days off.  Again, trying to do scans on her is  tough because of financial constraints.  She would really like to try to hold off on doing any scans until after the new year.  I had a long talk with Ms. Bernard.  I spent about 45 minutes with her. I went over the side effects with her regarding the Xeloda.  I gave her information about Xeloda.  I told her that it is hard to predict how she will tolerate this.  I explained to her the skin reactions, the mouth sores, the diarrhea, the sun sensitivity, and fatigue.  She understands all this.  Complicating the situation for Ms. Runion is that she has a very difficult home life.  She apparently has a son who stays at home or does not work and does not plan on working.  She basically is supporting him.  I really believe that if we do not find a response to  Xeloda, I am not sure what kind of shape Ms. Nunes will be in for any further therapy.  Ms. Haviland is well aware of the situation that she is an.  She wants to have some quality of life.  Hopefully, with the Xeloda, we can achieve this.  I want see her back in about 2 weeks just to make sure that she is doing okay.    ______________________________ Josph Macho, M.D. PRE/MEDQ  D:  03/11/2013  T:  03/12/2013  Job:  [id number]

## 2013-03-19 ENCOUNTER — Other Ambulatory Visit: Payer: Self-pay | Admitting: *Deleted

## 2013-03-19 DIAGNOSIS — C50919 Malignant neoplasm of unspecified site of unspecified female breast: Secondary | ICD-10-CM

## 2013-03-19 MED ORDER — ONDANSETRON HCL 8 MG PO TABS: 8.0000 mg | ORAL_TABLET | Freq: Three times a day (TID) | ORAL | Status: DC | PRN

## 2013-03-19 NOTE — Telephone Encounter (Incomplete)
Pt called requesting a Zofran refill but also to report bilaterally hand redness and

## 2013-03-19 NOTE — Progress Notes (Signed)
error 

## 2013-03-23 ENCOUNTER — Ambulatory Visit (HOSPITAL_BASED_OUTPATIENT_CLINIC_OR_DEPARTMENT_OTHER): Payer: Medicare Other | Admitting: Hematology & Oncology

## 2013-03-23 ENCOUNTER — Other Ambulatory Visit (HOSPITAL_BASED_OUTPATIENT_CLINIC_OR_DEPARTMENT_OTHER): Payer: Medicare Other | Admitting: Lab

## 2013-03-23 VITALS — BP 122/66 | HR 83 | Temp 97.9°F | Resp 14 | Ht 61.0 in | Wt 121.0 lb

## 2013-03-23 DIAGNOSIS — C50919 Malignant neoplasm of unspecified site of unspecified female breast: Secondary | ICD-10-CM

## 2013-03-23 DIAGNOSIS — C50119 Malignant neoplasm of central portion of unspecified female breast: Secondary | ICD-10-CM

## 2013-03-23 DIAGNOSIS — G4701 Insomnia due to medical condition: Secondary | ICD-10-CM

## 2013-03-23 DIAGNOSIS — C787 Secondary malignant neoplasm of liver and intrahepatic bile duct: Secondary | ICD-10-CM

## 2013-03-23 DIAGNOSIS — C7951 Secondary malignant neoplasm of bone: Secondary | ICD-10-CM

## 2013-03-23 LAB — CBC WITH DIFFERENTIAL (CANCER CENTER ONLY)
BASO%: 0.2 % (ref 0.0–2.0)
EOS%: 0.3 % (ref 0.0–7.0)
Eosinophils Absolute: 0 10*3/uL (ref 0.0–0.5)
HCT: 33.9 % — ABNORMAL LOW (ref 34.8–46.6)
LYMPH%: 27 % (ref 14.0–48.0)
MCH: 33.1 pg (ref 26.0–34.0)
MCHC: 33 g/dL (ref 32.0–36.0)
MCV: 100 fL (ref 81–101)
NEUT%: 64.1 % (ref 39.6–80.0)
RBC: 3.38 10*6/uL — ABNORMAL LOW (ref 3.70–5.32)
RDW: 12 % (ref 11.1–15.7)

## 2013-03-23 LAB — CMP (CANCER CENTER ONLY)
ALT(SGPT): 18 U/L (ref 10–47)
AST: 23 U/L (ref 11–38)
BUN, Bld: 16 mg/dL (ref 7–22)
Creat: 0.6 mg/dl (ref 0.6–1.2)
Total Bilirubin: 0.6 mg/dl (ref 0.20–1.60)

## 2013-03-23 MED ORDER — TRAZODONE HCL 50 MG PO TABS: 50.0000 mg | ORAL_TABLET | Freq: Every day | ORAL | Status: DC

## 2013-03-23 NOTE — Progress Notes (Signed)
This office note has been dictated.

## 2013-03-24 NOTE — Progress Notes (Signed)
DIAGNOSIS:  Metastatic breast cancer.  CURRENT THERAPY: 1. Xeloda 1500 mg p.o. b.i.d. x14 days out of 21 days. 2. Zometa 4 mg IV q. month.  INTERIM HISTORY:  Anna Benjamin comes in for followup.  We started her on Xeloda.  We had her on, I think, 1500 mg twice a day.  After about a week, she began to have side effects with respect to PPE.  We had her stop the Xeloda for the weekend and then go back onto 1500 mg a day.  Since she has decreased the Xeloda dose, she has felt better.  Her hands are not nearly as painful.  Her appetite is doing okay.  She has had no problems with nausea or vomiting.  There have been no mouth sores.  Her other problem is that she is not able to sleep.  She says that she wakes up at about 3 o'clock and cannot go to sleep.  She is on Restoril. We will stop the Restoril and try her on trazodone.  I will start her off on 50 mg a day.  If after a week she has no improvement, then we can increase her up to 1000 mg a day.  She has not noted any problems with fever.  There has been no diarrhea. In fact, she is constipated.  There is no leg swelling.  There have been no skin rashes.  She has had no watery eyes.  Overall, her performance status is ECOG 1-2.  PHYSICAL EXAMINATION:  General:  This is a petite white female in no obvious distress.  Vital signs:  Temperature of 97.9, pulse 83, respiratory rate 14, blood pressure 122/66.  Weight is 121 pounds.  Head and neck:  Normocephalic, atraumatic skull.  There are no ocular or oral lesions.  There are no palpable cervical or supraclavicular lymph nodes. Lungs:  Clear bilaterally.  Cardiac:  Regular rate and rhythm with a normal S1 and S2.  There are no murmurs, rubs or bruits.  Abdomen: Soft.  She has good bowel sounds.  There is no fluid wave.  There is no palpable abdominal mass.  There is no palpable hepatosplenomegaly. Back:  No tenderness over the spine, ribs, or hips.  Extremities:  No clubbing,  cyanosis or edema.  There may be some slight erythema on the palms of her hand.  I see no cracking.  There is no swelling of the palms.  Neurologic:  no focal neurological deficits.  LABORATORY STUDIES:  White cell count of 9.4, hemoglobin 11.2, hematocrit 33.9, platelet count 221.  Her electrolytes all look fairly normal.  Glucose is up slightly at 154.  IMPRESSION:  Anna Benjamin is a 77 year old white female with metastatic breast cancer.  She actually has done very well until recently, when we found out her disease was progressing.  We switched her onto Xeloda.  I think that she will tolerate the Xeloda.  We just need to gradually titrate the dose up.  She will continue on the 1500 mg a day for the rest this week.  She will then take her week off.  In fact, she will take 10 days off.  Scar on the 27th of October, she then go to 1500 mg in the morning and 500 mg in the evening until I see her back.  I will see her back on the 30th.  I spent about 45 minutes with her.  It took awhile to finally get her to understand our recommendation for the Xeloda dosing.  It is clearly way too early to check her for response.  We will get her back on the 30th.    ______________________________ Josph Macho, M.D. PRE/MEDQ  D:  03/23/2013  T:  03/24/2013  Job:  [id number]

## 2013-04-08 ENCOUNTER — Ambulatory Visit (HOSPITAL_BASED_OUTPATIENT_CLINIC_OR_DEPARTMENT_OTHER): Payer: Medicare Other | Admitting: Hematology & Oncology

## 2013-04-08 ENCOUNTER — Ambulatory Visit (HOSPITAL_BASED_OUTPATIENT_CLINIC_OR_DEPARTMENT_OTHER): Payer: Medicare Other

## 2013-04-08 ENCOUNTER — Ambulatory Visit (HOSPITAL_BASED_OUTPATIENT_CLINIC_OR_DEPARTMENT_OTHER): Payer: Medicare Other | Admitting: Lab

## 2013-04-08 VITALS — BP 138/64 | HR 72 | Temp 98.1°F | Resp 14 | Ht 61.0 in | Wt 123.0 lb

## 2013-04-08 DIAGNOSIS — C7951 Secondary malignant neoplasm of bone: Secondary | ICD-10-CM

## 2013-04-08 DIAGNOSIS — C50119 Malignant neoplasm of central portion of unspecified female breast: Secondary | ICD-10-CM

## 2013-04-08 DIAGNOSIS — M810 Age-related osteoporosis without current pathological fracture: Secondary | ICD-10-CM

## 2013-04-08 DIAGNOSIS — C50919 Malignant neoplasm of unspecified site of unspecified female breast: Secondary | ICD-10-CM

## 2013-04-08 LAB — CBC WITH DIFFERENTIAL (CANCER CENTER ONLY)
BASO#: 0 10*3/uL (ref 0.0–0.2)
BASO%: 0.5 % (ref 0.0–2.0)
EOS%: 0.5 % (ref 0.0–7.0)
Eosinophils Absolute: 0 10*3/uL (ref 0.0–0.5)
HCT: 38.6 % (ref 34.8–46.6)
LYMPH#: 1.9 10*3/uL (ref 0.9–3.3)
LYMPH%: 28 % (ref 14.0–48.0)
MCH: 33.6 pg (ref 26.0–34.0)
MCHC: 32.1 g/dL (ref 32.0–36.0)
MONO%: 11.6 % (ref 0.0–13.0)
NEUT#: 3.9 10*3/uL (ref 1.5–6.5)
NEUT%: 59.4 % (ref 39.6–80.0)
RDW: 14 % (ref 11.1–15.7)

## 2013-04-08 LAB — BASIC METABOLIC PANEL
CO2: 27 mEq/L (ref 19–32)
Calcium: 9.8 mg/dL (ref 8.4–10.5)
Chloride: 102 mEq/L (ref 96–112)
Creatinine, Ser: 0.86 mg/dL (ref 0.50–1.10)
Glucose, Bld: 99 mg/dL (ref 70–99)
Sodium: 135 mEq/L (ref 135–145)

## 2013-04-08 MED ORDER — ZOLEDRONIC ACID 4 MG/100ML IV SOLN
4.0000 mg | Freq: Once | INTRAVENOUS | Status: AC
  Administered 2013-04-08: 4 mg via INTRAVENOUS
  Filled 2013-04-08: qty 100

## 2013-04-08 NOTE — Patient Instructions (Signed)
Zoledronic Acid injection (Hypercalcemia, Oncology) What is this medicine? ZOLEDRONIC ACID (ZOE le dron ik AS id) lowers the amount of calcium loss from bone. It is used to treat too much calcium in your blood from cancer. It is also used to prevent complications of cancer that has spread to the bone. This medicine may be used for other purposes; ask your health care provider or pharmacist if you have questions. What should I tell my health care provider before I take this medicine? They need to know if you have any of these conditions: -aspirin-sensitive asthma -dental disease -kidney disease -an unusual or allergic reaction to zoledronic acid, other medicines, foods, dyes, or preservatives -pregnant or trying to get pregnant -breast-feeding How should I use this medicine? This medicine is for infusion into a vein. It is given by a health care professional in a hospital or clinic setting. Talk to your pediatrician regarding the use of this medicine in children. Special care may be needed. Overdosage: If you think you have taken too much of this medicine contact a poison control center or emergency room at once. NOTE: This medicine is only for you. Do not share this medicine with others. What if I miss a dose? It is important not to miss your dose. Call your doctor or health care professional if you are unable to keep an appointment. What may interact with this medicine? -certain antibiotics given by injection -NSAIDs, medicines for pain and inflammation, like ibuprofen or naproxen -some diuretics like bumetanide, furosemide -teriparatide -thalidomide This list may not describe all possible interactions. Give your health care provider a list of all the medicines, herbs, non-prescription drugs, or dietary supplements you use. Also tell them if you smoke, drink alcohol, or use illegal drugs. Some items may interact with your medicine. What should I watch for while using this medicine? Visit  your doctor or health care professional for regular checkups. It may be some time before you see the benefit from this medicine. Do not stop taking your medicine unless your doctor tells you to. Your doctor may order blood tests or other tests to see how you are doing. Women should inform their doctor if they wish to become pregnant or think they might be pregnant. There is a potential for serious side effects to an unborn child. Talk to your health care professional or pharmacist for more information. You should make sure that you get enough calcium and vitamin D while you are taking this medicine. Discuss the foods you eat and the vitamins you take with your health care professional. Some people who take this medicine have severe bone, joint, and/or muscle pain. This medicine may also increase your risk for a broken thigh bone. Tell your doctor right away if you have pain in your upper leg or groin. Tell your doctor if you have any pain that does not go away or that gets worse. What side effects may I notice from receiving this medicine? Side effects that you should report to your doctor or health care professional as soon as possible: -allergic reactions like skin rash, itching or hives, swelling of the face, lips, or tongue -anxiety, confusion, or depression -breathing problems -changes in vision -feeling faint or lightheaded, falls -jaw burning, cramping, pain -muscle cramps, stiffness, or weakness -trouble passing urine or change in the amount of urine Side effects that usually do not require medical attention (report to your doctor or health care professional if they continue or are bothersome): -bone, joint, or muscle pain -  fever -hair loss -irritation at site where injected -loss of appetite -nausea, vomiting -stomach upset -tired This list may not describe all possible side effects. Call your doctor for medical advice about side effects. You may report side effects to FDA at  1-800-FDA-1088. Where should I keep my medicine? This drug is given in a hospital or clinic and will not be stored at home. NOTE: This sheet is a summary. It may not cover all possible information. If you have questions about this medicine, talk to your doctor, pharmacist, or health care provider.  2013, [employer]. (11/23/2010 9:06:58 AM)  

## 2013-04-08 NOTE — Progress Notes (Signed)
This office note has been dictated.

## 2013-04-09 NOTE — Progress Notes (Signed)
CC:   Anna Perla, DO  DIAGNOSIS:  Metastatic breast cancer.  CURRENT THERAPY: 1. Xeloda 1500 mg p.o. q.a.m. and 1000 mg p.o. q p.m. (14/21 days). 2. Zometa 4 mg IV monthly.  INTERIM HISTORY:  Anna Benjamin comes in for followup.  We got her back onto the Xeloda.  She has done well with this.  As such, we are going to further increase her evening dose up to 1000 mg.  She does not have any breakdown of the skin of her hands or feet.  She has been very aggressive with putting creams on.  She slept a little bit better with the trazodone.  She actually takes 2 at nighttime to try to help her sleep, which does make a difference for her.  She has had no diarrhea.  Her appetite actually is doing better.  She has had no fever.  She has had no cough.  There have been no mouth sores.  Overall, her performance status is ECOG 1.  PHYSICAL EXAMINATION:  General:  This is a petite white female in no obvious distress.  Vital signs:  Temperature of 98.1, pulse 72, respiratory rate 14, blood pressure 138/64, weight is 123 pounds.  Head and Neck:  Normocephalic, atraumatic skull.  There are no ocular or oral lesions.  There are no palpable cervical or supraclavicular lymph nodes. Lungs:  Clear bilaterally.  Cardiac:  Regular rate and rhythm with a normal S1 and S2.  There are no murmurs, rubs, or bruits.  Abdomen: Soft.  She has good bowel sounds.  There is no fluid wave.  She has no palpable hepatosplenomegaly.  Extremities:  No clubbing, cyanosis, or edema.  There may be some slight osteoarthritic changes in her joints. Skin:  No rashes, ecchymoses, or petechiae.  There may be some slight erythema on the palms of her hands.  There is no skin breakdown. Neurological:  No focal neurological deficits are noted.  LABORATORY STUDIES:  White cell count 6.6, hemoglobin 12.4, hematocrit 38.6, platelet count 133.  IMPRESSION:  Anna Benjamin is a very charming 77 year old white female with  metastatic breast cancer.  We have her on Xeloda.  We started her on Xeloda back in early October. She was on 1500 mg twice a day.  She had a very tough time with this. We subsequently have had to make dosage readjustments.  We will gradually continue to titrate her dose upward.  We will get her on 1500 a day alternating with 1000 mg a day.  I still think it is too early to do any scans on her.  We will plan to get her back to see me in another month.  We will do the Zometa at that time.  I spent a good half hour with Anna Benjamin talking to her about the dosage adjustments.    ______________________________ Josph Macho, M.D. PRE/MEDQ  D:  04/08/2013  T:  04/09/2013  Job:  [id number]

## 2013-04-15 ENCOUNTER — Other Ambulatory Visit: Payer: Self-pay

## 2013-04-29 ENCOUNTER — Ambulatory Visit (HOSPITAL_BASED_OUTPATIENT_CLINIC_OR_DEPARTMENT_OTHER): Payer: Medicare Other | Admitting: Hematology & Oncology

## 2013-04-29 ENCOUNTER — Ambulatory Visit (HOSPITAL_BASED_OUTPATIENT_CLINIC_OR_DEPARTMENT_OTHER): Payer: Medicare Other

## 2013-04-29 ENCOUNTER — Other Ambulatory Visit (HOSPITAL_BASED_OUTPATIENT_CLINIC_OR_DEPARTMENT_OTHER): Payer: Medicare Other | Admitting: Lab

## 2013-04-29 VITALS — BP 120/59 | HR 76 | Temp 98.1°F | Resp 14 | Ht 61.0 in | Wt 121.0 lb

## 2013-04-29 DIAGNOSIS — C50919 Malignant neoplasm of unspecified site of unspecified female breast: Secondary | ICD-10-CM

## 2013-04-29 DIAGNOSIS — C50119 Malignant neoplasm of central portion of unspecified female breast: Secondary | ICD-10-CM

## 2013-04-29 DIAGNOSIS — C787 Secondary malignant neoplasm of liver and intrahepatic bile duct: Secondary | ICD-10-CM

## 2013-04-29 DIAGNOSIS — F419 Anxiety disorder, unspecified: Secondary | ICD-10-CM

## 2013-04-29 DIAGNOSIS — C7951 Secondary malignant neoplasm of bone: Secondary | ICD-10-CM

## 2013-04-29 LAB — CMP (CANCER CENTER ONLY)
ALT(SGPT): 28 U/L (ref 10–47)
AST: 29 U/L (ref 11–38)
Albumin: 3.5 g/dL (ref 3.3–5.5)
Alkaline Phosphatase: 89 U/L — ABNORMAL HIGH (ref 26–84)
BUN, Bld: 15 mg/dL (ref 7–22)
Chloride: 100 mEq/L (ref 98–108)
Glucose, Bld: 110 mg/dL (ref 73–118)
Potassium: 3.6 mEq/L (ref 3.3–4.7)
Sodium: 138 mEq/L (ref 128–145)
Total Bilirubin: 0.9 mg/dl (ref 0.20–1.60)
Total Protein: 6 g/dL — ABNORMAL LOW (ref 6.4–8.1)

## 2013-04-29 LAB — CBC WITH DIFFERENTIAL (CANCER CENTER ONLY)
BASO%: 0.4 % (ref 0.0–2.0)
EOS%: 0.4 % (ref 0.0–7.0)
HCT: 36.7 % (ref 34.8–46.6)
HGB: 11.8 g/dL (ref 11.6–15.9)
LYMPH#: 2.6 10*3/uL (ref 0.9–3.3)
MCH: 34.1 pg — ABNORMAL HIGH (ref 26.0–34.0)
MCHC: 32.2 g/dL (ref 32.0–36.0)
MCV: 106 fL — ABNORMAL HIGH (ref 81–101)
MONO%: 9.1 % (ref 0.0–13.0)
NEUT#: 4.1 10*3/uL (ref 1.5–6.5)
NEUT%: 55 % (ref 39.6–80.0)
Platelets: 161 10*3/uL (ref 145–400)
RDW: 14.6 % (ref 11.1–15.7)

## 2013-04-29 MED ORDER — HYDROCODONE-ACETAMINOPHEN 10-325 MG PO TABS: ORAL_TABLET | ORAL | Status: DC

## 2013-04-29 MED ORDER — SODIUM CHLORIDE 0.9 % IV SOLN
Freq: Once | INTRAVENOUS | Status: AC
  Administered 2013-04-29: 15:00:00 via INTRAVENOUS

## 2013-04-29 MED ORDER — LORAZEPAM 0.5 MG PO TABS: 0.5000 mg | ORAL_TABLET | Freq: Four times a day (QID) | ORAL | Status: DC | PRN

## 2013-04-29 MED ORDER — ZOLEDRONIC ACID 4 MG/100ML IV SOLN
4.0000 mg | Freq: Once | INTRAVENOUS | Status: AC
  Administered 2013-04-29: 4 mg via INTRAVENOUS
  Filled 2013-04-29: qty 100

## 2013-04-29 NOTE — Progress Notes (Signed)
This office note has been dictated.

## 2013-04-29 NOTE — Patient Instructions (Signed)

## 2013-04-29 NOTE — Addendum Note (Signed)
Addended by: Arlan Organ R on: 04/29/2013 03:32 PM   Modules accepted: Orders

## 2013-04-30 LAB — CANCER ANTIGEN 27.29: CA 27.29: 1041 U/mL — ABNORMAL HIGH (ref 0–39)

## 2013-05-01 NOTE — Progress Notes (Signed)
CC:   Lelon Perla, DO  DIAGNOSIS:  Metastatic breast cancer.  CURRENT THERAPY: 1. Xeloda 15 mg p.o. q.a.m./1000 mg p.o. q.p.m. (14/21 days). 2. Zometa 4 mg IV q. month.  INTERIM HISTORY:  Anna Benjamin comes in for followup.  She is doing okay with the increased dose of Xeloda.  She is starting to have some hand- foot syndrome issues.  I do not think we can increase her dose any further.  She has had a lot of erythema on her hands and feet.  She has had no peeling or cracking of the skin yet.  She is being very aggressive with using moisturizers and creams to help prevent skin breakdown.  Her appetite has been good.  She still has lost only a couple of pounds.  Her sleeping still is not that good.  She is on 2 trazodone at nighttime.  She still wakes up at about 4 or 5 in the morning.  She has had no mouth sores.  She has had no headache.  She has had no cough or shortness of breath.  There has been no diarrhea.  PHYSICAL EXAMINATION:  General:  This is a petite white female, in no obvious distress.  Vital Signs:  Temperature of 98.1, pulse 76, respiratory rate 14, blood pressure 120/59, weight is 121 pounds.  Head and Neck:  Normocephalic, atraumatic skull.  There are no ocular or oral lesions.  There are no palpable cervical or supraclavicular lymph nodes. Lungs:  Clear bilaterally.  Cardiac:  Regular rate and rhythm with a normal S1 and S2.  There are no murmurs, rubs, or bruits.  Abdomen: Soft.  She has good bowel sounds.  There is no palpable abdominal mass. There is no palpable hepatosplenomegaly.  Back:  No tenderness over the spine, ribs, or hips.  Extremities:  No clubbing, cyanosis, or edema. She has erythema on her hands and feet.  She has good pulses in her distal extremities.  She has good strength in her arms and legs. Neurological:  No focal neurological deficits.  LABORATORY STUDIES:  White cell count is 7.5, hemoglobin 11.8, hematocrit 36.7, platelet count  161.  IMPRESSION:  Anna Benjamin is a very nice 77 year old white female.  She has metastatic breast cancer.  She is on Xeloda right now.  Again, I do not think we can advance the dose any further.  I would like to see what her CA 27-29 is.  This certainly will be important.  Last time we checked was 6 weeks ago, and it was 2200. Hopefully, we will find that it is lowered.  Anna Benjamin has financial issues, and it is hard for her to do scans. Hopefully, if we see that she is responding with a decrease in CA 27-29, then we can just follow along without having to do scans.  She will go for her Zometa today.  We will go ahead and give refills for the Vicodin and for the Ativan. We will plan to get her back in another month.    ______________________________ Josph Macho, M.D. PRE/MEDQ  D:  04/29/2013  T:  05/01/2013  Job:  [id number]

## 2013-05-10 ENCOUNTER — Other Ambulatory Visit: Payer: Self-pay | Admitting: Hematology & Oncology

## 2013-05-10 ENCOUNTER — Other Ambulatory Visit: Payer: Self-pay | Admitting: Family Medicine

## 2013-05-10 NOTE — Telephone Encounter (Signed)
Patient is being seen by Oncology for breast cancer and has not been seen by you since 2012. Please advise if refill appropriate.      KP

## 2013-05-11 ENCOUNTER — Other Ambulatory Visit: Payer: Self-pay | Admitting: Nurse Practitioner

## 2013-05-11 ENCOUNTER — Encounter: Payer: Self-pay | Admitting: Nurse Practitioner

## 2013-05-11 NOTE — Progress Notes (Signed)
Pt called and stated she had a fine rash over her chest and arms. She is not having any complications with it but just wanted to let us know. Instructed pt to use cortisone or benadryl if she experiences any issues. At this time she denies any itching, pain, or discomfort. She states this is her week off of her medications.

## 2013-05-18 ENCOUNTER — Telehealth: Payer: Self-pay | Admitting: *Deleted

## 2013-05-18 DIAGNOSIS — J069 Acute upper respiratory infection, unspecified: Secondary | ICD-10-CM

## 2013-05-18 MED ORDER — CEFUROXIME AXETIL 500 MG PO TABS: 500.0000 mg | ORAL_TABLET | Freq: Two times a day (BID) | ORAL | Status: DC

## 2013-05-18 NOTE — Telephone Encounter (Signed)
Reviewed pt's sx with Dr Myna Hidalgo. She is afebrile at this time and did state "I probably did this to myself by not taking my allergy pill. I re-started it yesterday". Sent Ceftin 500 mg BID x 7 days to [facility] via e-rx as requested by patient (and approved by Dr Myna Hidalgo).

## 2013-05-27 ENCOUNTER — Ambulatory Visit (HOSPITAL_BASED_OUTPATIENT_CLINIC_OR_DEPARTMENT_OTHER): Payer: Medicare Other | Admitting: Hematology & Oncology

## 2013-05-27 ENCOUNTER — Ambulatory Visit (HOSPITAL_BASED_OUTPATIENT_CLINIC_OR_DEPARTMENT_OTHER): Payer: Medicare Other

## 2013-05-27 ENCOUNTER — Other Ambulatory Visit (HOSPITAL_BASED_OUTPATIENT_CLINIC_OR_DEPARTMENT_OTHER): Payer: Medicare Other | Admitting: Lab

## 2013-05-27 VITALS — BP 129/69 | HR 88 | Temp 97.7°F | Resp 14 | Ht 61.0 in | Wt 121.0 lb

## 2013-05-27 DIAGNOSIS — C50919 Malignant neoplasm of unspecified site of unspecified female breast: Secondary | ICD-10-CM

## 2013-05-27 DIAGNOSIS — I10 Essential (primary) hypertension: Secondary | ICD-10-CM

## 2013-05-27 DIAGNOSIS — C7951 Secondary malignant neoplasm of bone: Secondary | ICD-10-CM

## 2013-05-27 DIAGNOSIS — C50119 Malignant neoplasm of central portion of unspecified female breast: Secondary | ICD-10-CM

## 2013-05-27 DIAGNOSIS — M810 Age-related osteoporosis without current pathological fracture: Secondary | ICD-10-CM

## 2013-05-27 DIAGNOSIS — L270 Generalized skin eruption due to drugs and medicaments taken internally: Secondary | ICD-10-CM

## 2013-05-27 DIAGNOSIS — R110 Nausea: Secondary | ICD-10-CM

## 2013-05-27 DIAGNOSIS — C787 Secondary malignant neoplasm of liver and intrahepatic bile duct: Secondary | ICD-10-CM

## 2013-05-27 DIAGNOSIS — G9589 Other specified diseases of spinal cord: Secondary | ICD-10-CM

## 2013-05-27 LAB — CMP (CANCER CENTER ONLY)
ALT(SGPT): 24 U/L (ref 10–47)
AST: 25 U/L (ref 11–38)
Albumin: 3.8 g/dL (ref 3.3–5.5)
Alkaline Phosphatase: 99 U/L — ABNORMAL HIGH (ref 26–84)
CO2: 33 mEq/L (ref 18–33)
Calcium: 9.5 mg/dL (ref 8.0–10.3)
Glucose, Bld: 126 mg/dL — ABNORMAL HIGH (ref 73–118)
Potassium: 4 mEq/L (ref 3.3–4.7)
Sodium: 141 mEq/L (ref 128–145)
Total Protein: 6.6 g/dL (ref 6.4–8.1)

## 2013-05-27 LAB — CBC WITH DIFFERENTIAL (CANCER CENTER ONLY)
BASO#: 0 10*3/uL (ref 0.0–0.2)
BASO%: 0.1 % (ref 0.0–2.0)
HCT: 36 % (ref 34.8–46.6)
HGB: 11.9 g/dL (ref 11.6–15.9)
LYMPH#: 2.8 10*3/uL (ref 0.9–3.3)
MONO#: 0.6 10*3/uL (ref 0.1–0.9)
NEUT%: 58.4 % (ref 39.6–80.0)
Platelets: 178 10*3/uL (ref 145–400)
RDW: 15 % (ref 11.1–15.7)

## 2013-05-27 MED ORDER — HEPARIN SOD (PORK) LOCK FLUSH 100 UNIT/ML IV SOLN
250.0000 [IU] | Freq: Once | INTRAVENOUS | Status: DC | PRN
  Filled 2013-05-27: qty 5

## 2013-05-27 MED ORDER — TRAZODONE HCL 50 MG PO TABS
ORAL_TABLET | ORAL | Status: DC
Start: 2013-05-27 — End: 2014-01-11

## 2013-05-27 MED ORDER — ZOLEDRONIC ACID 4 MG/100ML IV SOLN
4.0000 mg | Freq: Once | INTRAVENOUS | Status: AC
  Administered 2013-05-27: 4 mg via INTRAVENOUS
  Filled 2013-05-27: qty 100

## 2013-05-27 MED ORDER — SODIUM CHLORIDE 0.9 % IV SOLN
Freq: Once | INTRAVENOUS | Status: AC
  Administered 2013-05-27: 15:00:00 via INTRAVENOUS

## 2013-05-27 MED ORDER — VITAMIN B-6 250 MG PO TABS: 250.0000 mg | ORAL_TABLET | Freq: Every day | ORAL | Status: DC

## 2013-05-27 MED ORDER — LISINOPRIL-HYDROCHLOROTHIAZIDE 10-12.5 MG PO TABS: 1.0000 | ORAL_TABLET | Freq: Every day | ORAL | Status: DC

## 2013-05-27 NOTE — Patient Instructions (Signed)

## 2013-05-28 LAB — LACTATE DEHYDROGENASE: LDH: 246 U/L (ref 94–250)

## 2013-05-30 NOTE — Progress Notes (Signed)
This office note has been dictated.

## 2013-05-31 ENCOUNTER — Encounter: Payer: Self-pay | Admitting: *Deleted

## 2013-05-31 ENCOUNTER — Telehealth: Payer: Self-pay | Admitting: Hematology & Oncology

## 2013-05-31 NOTE — Telephone Encounter (Signed)
Left message with 12-31 appointment °

## 2013-05-31 NOTE — Telephone Encounter (Signed)
i made mistake on 12-31 appointment. Pt aware of 1-22 MD said its ok to go out 5 weeks

## 2013-05-31 NOTE — Progress Notes (Signed)
DIAGNOSIS:  Metastatic breast cancer.  CURRENT THERAPY: 1. Xeloda 1500 mg p.o. q.a.m. and 1000 mg p.o. q.p.m. (14 days on and     21 days off). 2. Zometa 4 mg IV every monthly.  INTERIM HISTORY:  Anna Benjamin comes in for a followup.  She is doing fairly well.  She is still having some problems with her hands and feet. She has the hand and foot syndrome.  It is not too bad.  She says that she can still do things, although it is a little more difficult for her.  I told her that we could always decrease her Xeloda dose, but she really wants to keep the dose as high as possible.  She is having a little bit of nausea but no vomiting.  Her appetite seems to be doing okay.  She is still not sleeping all that well, but this has been chronic for her.  She has had no abdominal pain.  She has had no change in bowel or bladder habits.  There have been no mouth sores.  Overall, her performance status continues to be fairly good at ECOG 1.  Her last CA 27.29 came down quite nicely.  It went from 2220 down to 1040.  PHYSICAL EXAMINATION:  General:  This is a petite white female, in no obvious distress.  Vital Signs:  Temperature of 97.7, pulse 88, respiratory rate 14, blood pressure 129/69.  Weight is 121 pounds.  Head and Neck:  Normocephalic, atraumatic skull.  She has no ocular or oral lesions.  There is no mucositis.  There is no scleral icterus.  Neck: Supple with no adenopathy.  Lungs:  Clear bilaterally.  Cardiac: Regular rate and rhythm with a normal S1, S2.  There are no murmurs, rubs, or bruits.  Abdomen:  Soft.  She has good bowel sounds.  There is no fluid wave.  There is no palpable abdominal mass.  There is no palpable hepatosplenomegaly.  Back:  Some slight kyphosis.  She has no tenderness over the spine, ribs, or hips.  Extremity:  No lymphedema. She has good range motion of her joints.  She has good muscle strength. Skin:  Erythema on her hands and feet.  She has no cracking  of the skin. There is no peeling of the skin.  Her digits are slightly swollen. Neurological:  No focal neurological deficit.  LABORATORY STUDIES:  White cell count is 8.4, hemoglobin 11.9, hematocrit 36, platelet count 178.  LDH is 246.  BUN is 19 x 19 and 0.9.  Her CA 27.29 is now 750.  IMPRESSION:  Anna Benjamin is a very charming 77 year old white female. She has metastatic breast cancer.  She is on Xeloda.  I think she will be on Xeloda now probably for about 2 months or so.  We have had to make some dosage adjustments because of skin toxicity.  Regardless, she is responding.  This I am quite happy about.  Her CA 27.29 is coming down.  As long as her tumor marker keeps coming down, and she is doing well, we can hold off on doing scans on her.  The scans are very expensive for her, and I do not want to see her become bankrupt because of this.  We will continue on the same dose of Xeloda.  Again, she is on 1500 mg in the morning and 1000 mg in the evening.  I will plan to get her back to see me in another month.  I know she  is having some issues with co-pays.  Hopefully, we might be able to get her away from having to make a co-pay every time she comes in.    ______________________________ Josph Macho, M.D. PRE/MEDQ  D:  05/30/2013  T:  05/31/2013  Job:  [id number]

## 2013-06-09 ENCOUNTER — Other Ambulatory Visit: Payer: Medicare Other | Admitting: Lab

## 2013-06-09 ENCOUNTER — Ambulatory Visit: Payer: Medicare Other

## 2013-06-09 ENCOUNTER — Ambulatory Visit: Payer: Medicare Other | Admitting: Hematology & Oncology

## 2013-06-22 ENCOUNTER — Encounter: Payer: Self-pay | Admitting: Nurse Practitioner

## 2013-06-22 NOTE — Progress Notes (Signed)
Pt came in stating it would cost over $500/month for her Xeloda. Received all information and pt applied for [facility] [employer]. Online approval received awaiting confirmation via fax. Pt aware of status.

## 2013-07-01 ENCOUNTER — Ambulatory Visit (HOSPITAL_BASED_OUTPATIENT_CLINIC_OR_DEPARTMENT_OTHER): Payer: Medicare Other | Admitting: Hematology & Oncology

## 2013-07-01 ENCOUNTER — Ambulatory Visit (HOSPITAL_BASED_OUTPATIENT_CLINIC_OR_DEPARTMENT_OTHER): Payer: Medicare Other

## 2013-07-01 ENCOUNTER — Encounter: Payer: Self-pay | Admitting: Hematology & Oncology

## 2013-07-01 ENCOUNTER — Other Ambulatory Visit (HOSPITAL_BASED_OUTPATIENT_CLINIC_OR_DEPARTMENT_OTHER): Payer: Medicare Other | Admitting: Lab

## 2013-07-01 VITALS — BP 129/80 | HR 73 | Temp 97.4°F | Resp 14 | Ht <= 58 in | Wt 122.0 lb

## 2013-07-01 DIAGNOSIS — C7951 Secondary malignant neoplasm of bone: Secondary | ICD-10-CM

## 2013-07-01 DIAGNOSIS — C50119 Malignant neoplasm of central portion of unspecified female breast: Secondary | ICD-10-CM

## 2013-07-01 DIAGNOSIS — C7952 Secondary malignant neoplasm of bone marrow: Secondary | ICD-10-CM

## 2013-07-01 DIAGNOSIS — C787 Secondary malignant neoplasm of liver and intrahepatic bile duct: Secondary | ICD-10-CM

## 2013-07-01 DIAGNOSIS — C50919 Malignant neoplasm of unspecified site of unspecified female breast: Secondary | ICD-10-CM

## 2013-07-01 DIAGNOSIS — M810 Age-related osteoporosis without current pathological fracture: Secondary | ICD-10-CM

## 2013-07-01 LAB — CBC WITH DIFFERENTIAL (CANCER CENTER ONLY)
BASO#: 0 10*3/uL (ref 0.0–0.2)
BASO%: 0.5 % (ref 0.0–2.0)
EOS ABS: 0 10*3/uL (ref 0.0–0.5)
EOS%: 0.3 % (ref 0.0–7.0)
HCT: 36 % (ref 34.8–46.6)
HGB: 11.9 g/dL (ref 11.6–15.9)
LYMPH#: 2.2 10*3/uL (ref 0.9–3.3)
LYMPH%: 35.7 % (ref 14.0–48.0)
MCH: 37.2 pg — AB (ref 26.0–34.0)
MCHC: 33.1 g/dL (ref 32.0–36.0)
MCV: 113 fL — AB (ref 81–101)
MONO#: 0.6 10*3/uL (ref 0.1–0.9)
MONO%: 9.3 % (ref 0.0–13.0)
NEUT%: 54.2 % (ref 39.6–80.0)
NEUTROS ABS: 3.3 10*3/uL (ref 1.5–6.5)
Platelets: 121 10*3/uL — ABNORMAL LOW (ref 145–400)
RBC: 3.2 10*6/uL — AB (ref 3.70–5.32)
RDW: 14.7 % (ref 11.1–15.7)
WBC: 6 10*3/uL (ref 3.9–10.0)

## 2013-07-01 LAB — CMP (CANCER CENTER ONLY)
ALT: 31 U/L (ref 10–47)
AST: 39 U/L — AB (ref 11–38)
Albumin: 4 g/dL (ref 3.3–5.5)
Alkaline Phosphatase: 80 U/L (ref 26–84)
BILIRUBIN TOTAL: 1.2 mg/dL (ref 0.20–1.60)
BUN: 18 mg/dL (ref 7–22)
CO2: 31 mEq/L (ref 18–33)
Calcium: 9.4 mg/dL (ref 8.0–10.3)
Chloride: 102 mEq/L (ref 98–108)
Creat: 1 mg/dl (ref 0.6–1.2)
Glucose, Bld: 104 mg/dL (ref 73–118)
Potassium: 3.7 mEq/L (ref 3.3–4.7)
Sodium: 138 mEq/L (ref 128–145)
Total Protein: 6.5 g/dL (ref 6.4–8.1)

## 2013-07-01 MED ORDER — SODIUM CHLORIDE 0.9 % IV SOLN
Freq: Once | INTRAVENOUS | Status: AC
  Administered 2013-07-01: 15:00:00 via INTRAVENOUS

## 2013-07-01 MED ORDER — ZOLEDRONIC ACID 4 MG/100ML IV SOLN
4.0000 mg | Freq: Once | INTRAVENOUS | Status: AC
  Administered 2013-07-01: 4 mg via INTRAVENOUS
  Filled 2013-07-01: qty 100

## 2013-07-01 NOTE — Patient Instructions (Signed)

## 2013-07-01 NOTE — Progress Notes (Signed)
This office note has been dictated.

## 2013-07-02 LAB — LACTATE DEHYDROGENASE: LDH: 208 U/L (ref 94–250)

## 2013-07-02 LAB — CANCER ANTIGEN 27.29: CA 27.29: 535 U/mL — ABNORMAL HIGH (ref 0–39)

## 2013-07-02 NOTE — Progress Notes (Signed)
DIAGNOSIS:  Metastatic breast cancer.  CURRENT THERAPY: 1. Xeloda 1500 mg p.o. q.a.m. and 1000 mg p.o. q.p.m. (14/7). 2. Zometa 4 mg IV monthly.  INTERIM HISTORY:  Ms. Guidotti comes in for a followup.  She continues to do fairly well.  She does have some of the skin toxicity with the Xeloda.  She is doing all she can to try to minimize the toxicity.  Her last CA 27.29 was down to 750.  When we first started, her CA 27.29 was 2218.  She has had very little nausea, vomiting.  She is having good pain control.  The problem that we have is financial.  She just cannot afford to come in monthly.  It is ridiculous that she is charged 85 dollars a visit. As such, we will have to adjust her dosing and have her get the Zometa every 2 months now.  I think as long as her CA 27.29 drops, then we do not have to do any scans on her.  She has had no diarrhea.  She has had no headache.  She has had no odynophagia.  PHYSICAL EXAMINATION:  General:  This is a petite white female, in no obvious distress.  Vital Signs:  Temperature of 97.4, pulse 73, respiratory rate 14, blood pressure 129/80.  Weight is 122 pounds.  Head and Neck:  Normocephalic and atraumatic skull.  There are no ocular or oral lesions.  There are no palpable cervical or supraclavicular lymph nodes.  Lungs:  Clear bilaterally.  Cardiac:  Regular rate and rhythm with a normal S1 and S2.  There are no murmurs, rubs, or bruits. Abdomen:  Soft.  She has good bowel sounds.  There is no fluid wave. There is no palpable abdominal mass.  There is no palpable hepatosplenomegaly.  Back:  No tenderness of the spine, ribs, or hips. She has some slight kyphosis.  Extremities:  Does show some erythema and mild swelling on the hands.  She has good range of motion of her joints. Skin:  Shows no rashes outside of what is on her hands consistent with a PPE.  Neurological:  Shows no focal neurological deficits.  LABORATORY STUDIES:  White cell  count is 6, hemoglobin 11.9, hematocrit 36, platelet count 121.  IMPRESSION:  Anna Benjamin is a very charming 78 year old white female. She has metastatic breast cancer.  She is on Xeloda.  Her CA 27.29 is going down.  We will go ahead and just follow her along.  We will give her Zometa today.  I will then plan for Zometa in 2 months.  She knows to take the Xeloda 14 days out of 21.  I will plan to see her back in 2 months' time.  Again, as long as her CA 27.29 decreases, we do not have to do any scans.  I spent a good half hour with her.  We have to try to get her schedule rearranged and try to get the financial issues taken care of.  I reviewed all the lab work with her.    ______________________________ Volanda Napoleon, M.D. PRE/MEDQ  D:  07/01/2013  T:  07/02/2013  Job:  [id number]

## 2013-07-05 ENCOUNTER — Encounter: Payer: Self-pay | Admitting: *Deleted

## 2013-07-08 ENCOUNTER — Ambulatory Visit: Payer: Medicare Other

## 2013-07-08 ENCOUNTER — Ambulatory Visit: Payer: Medicare Other | Admitting: Hematology & Oncology

## 2013-07-08 ENCOUNTER — Other Ambulatory Visit: Payer: Medicare Other | Admitting: Lab

## 2013-07-12 ENCOUNTER — Other Ambulatory Visit: Payer: Self-pay | Admitting: *Deleted

## 2013-07-12 DIAGNOSIS — C50919 Malignant neoplasm of unspecified site of unspecified female breast: Secondary | ICD-10-CM

## 2013-07-12 DIAGNOSIS — F419 Anxiety disorder, unspecified: Secondary | ICD-10-CM

## 2013-07-12 MED ORDER — HYDROCODONE-ACETAMINOPHEN 10-325 MG PO TABS: ORAL_TABLET | ORAL | Status: DC

## 2013-07-12 MED ORDER — LORAZEPAM 0.5 MG PO TABS: 0.5000 mg | ORAL_TABLET | Freq: Four times a day (QID) | ORAL | Status: DC | PRN

## 2013-07-12 MED ORDER — MEGESTROL ACETATE 40 MG PO TABS: ORAL_TABLET | ORAL | Status: DC

## 2013-07-19 ENCOUNTER — Other Ambulatory Visit: Payer: Self-pay | Admitting: Hematology & Oncology

## 2013-07-29 ENCOUNTER — Ambulatory Visit: Payer: Medicare Other | Admitting: Hematology & Oncology

## 2013-07-29 ENCOUNTER — Other Ambulatory Visit: Payer: Medicare Other | Admitting: Lab

## 2013-07-29 ENCOUNTER — Ambulatory Visit: Payer: Medicare Other

## 2013-08-09 ENCOUNTER — Other Ambulatory Visit: Payer: Self-pay | Admitting: Family Medicine

## 2013-08-27 ENCOUNTER — Ambulatory Visit (HOSPITAL_BASED_OUTPATIENT_CLINIC_OR_DEPARTMENT_OTHER): Payer: Medicare Other | Admitting: Hematology & Oncology

## 2013-08-27 ENCOUNTER — Other Ambulatory Visit (HOSPITAL_BASED_OUTPATIENT_CLINIC_OR_DEPARTMENT_OTHER): Payer: Medicare Other | Admitting: Lab

## 2013-08-27 ENCOUNTER — Other Ambulatory Visit: Payer: Self-pay | Admitting: *Deleted

## 2013-08-27 ENCOUNTER — Ambulatory Visit (HOSPITAL_BASED_OUTPATIENT_CLINIC_OR_DEPARTMENT_OTHER): Payer: Medicare Other

## 2013-08-27 VITALS — BP 127/87 | HR 76 | Temp 97.8°F | Resp 20 | Ht <= 58 in | Wt 121.0 lb

## 2013-08-27 DIAGNOSIS — M810 Age-related osteoporosis without current pathological fracture: Secondary | ICD-10-CM

## 2013-08-27 DIAGNOSIS — C50119 Malignant neoplasm of central portion of unspecified female breast: Secondary | ICD-10-CM

## 2013-08-27 DIAGNOSIS — C50919 Malignant neoplasm of unspecified site of unspecified female breast: Secondary | ICD-10-CM

## 2013-08-27 DIAGNOSIS — F419 Anxiety disorder, unspecified: Secondary | ICD-10-CM

## 2013-08-27 DIAGNOSIS — C7952 Secondary malignant neoplasm of bone marrow: Secondary | ICD-10-CM

## 2013-08-27 DIAGNOSIS — C7951 Secondary malignant neoplasm of bone: Secondary | ICD-10-CM

## 2013-08-27 DIAGNOSIS — C787 Secondary malignant neoplasm of liver and intrahepatic bile duct: Secondary | ICD-10-CM

## 2013-08-27 LAB — CMP (CANCER CENTER ONLY)
ALT: 18 U/L (ref 10–47)
AST: 45 U/L — ABNORMAL HIGH (ref 11–38)
Albumin: 3.8 g/dL (ref 3.3–5.5)
Alkaline Phosphatase: 86 U/L — ABNORMAL HIGH (ref 26–84)
BILIRUBIN TOTAL: 0.7 mg/dL (ref 0.20–1.60)
BUN, Bld: 20 mg/dL (ref 7–22)
CO2: 28 meq/L (ref 18–33)
CREATININE: 0.6 mg/dL (ref 0.6–1.2)
Calcium: 9.5 mg/dL (ref 8.0–10.3)
Chloride: 101 mEq/L (ref 98–108)
GLUCOSE: 113 mg/dL (ref 73–118)
Potassium: 3.6 mEq/L (ref 3.3–4.7)
Sodium: 138 mEq/L (ref 128–145)
Total Protein: 6.6 g/dL (ref 6.4–8.1)

## 2013-08-27 LAB — CBC WITH DIFFERENTIAL (CANCER CENTER ONLY)
BASO#: 0 10*3/uL (ref 0.0–0.2)
BASO%: 0.2 % (ref 0.0–2.0)
EOS%: 0.5 % (ref 0.0–7.0)
Eosinophils Absolute: 0 10*3/uL (ref 0.0–0.5)
HCT: 35.8 % (ref 34.8–46.6)
HGB: 12 g/dL (ref 11.6–15.9)
LYMPH#: 2.7 10*3/uL (ref 0.9–3.3)
LYMPH%: 32.2 % (ref 14.0–48.0)
MCH: 38 pg — AB (ref 26.0–34.0)
MCHC: 33.5 g/dL (ref 32.0–36.0)
MCV: 113 fL — AB (ref 81–101)
MONO#: 0.7 10*3/uL (ref 0.1–0.9)
MONO%: 8.8 % (ref 0.0–13.0)
NEUT#: 4.8 10*3/uL (ref 1.5–6.5)
NEUT%: 58.3 % (ref 39.6–80.0)
PLATELETS: 156 10*3/uL (ref 145–400)
RBC: 3.16 10*6/uL — ABNORMAL LOW (ref 3.70–5.32)
RDW: 15.5 % (ref 11.1–15.7)
WBC: 8.3 10*3/uL (ref 3.9–10.0)

## 2013-08-27 LAB — CANCER ANTIGEN 27.29: CA 27.29: 448 U/mL — ABNORMAL HIGH (ref 0–39)

## 2013-08-27 MED ORDER — ZOLEDRONIC ACID 4 MG/100ML IV SOLN
4.0000 mg | Freq: Once | INTRAVENOUS | Status: AC
  Administered 2013-08-27: 4 mg via INTRAVENOUS
  Filled 2013-08-27: qty 100

## 2013-08-27 MED ORDER — LORAZEPAM 0.5 MG PO TABS: 0.5000 mg | ORAL_TABLET | Freq: Three times a day (TID) | ORAL | Status: DC | PRN

## 2013-08-27 NOTE — Progress Notes (Signed)
DIAGNOSIS:  Metastatic breast cancer.  CURRENT THERAPY: 1. Xeloda 1500 mg p.o. q.a.m. and 1000 mg p.o. q.p.m. (14/7). 2. Zometa 4 mg IV every 2 months.  INTERIM HISTORY:  Anna Benjamin comes in for a followup.  She continues to do fairly well.  She does have some of the skin toxicity with the Xeloda.  She is doing all she can to try to minimize the toxicity.  Her last CA 27.29 was down to 535.  When we first started, her CA 27.29 was 2218.  She has had very little nausea, vomiting.  She is having good pain control.  The problem that we have is financial.  She just cannot afford to come in monthly.  It is ridiculous that she is charged 85 dollars a visit. As such, we will have to adjust her dosing and have her get the Zometa every 2 months now.  I think as long as her CA 27.29 drops, then we do not have to do any scans on her.  She has had no diarrhea.  She has had no headache.  She has had no odynophagia.  PHYSICAL EXAMINATION:  General:  This is a petite white female, in no obvious distress.  Vital Signs:  Temperature of 97.8, pulse 76, respiratory rate 14, blood pressure 127/78.  Weight is 121 pounds.  Head and Neck:  Normocephalic and atraumatic skull.  There are no ocular or oral lesions.  There are no palpable cervical or supraclavicular lymph nodes.  Lungs:  Clear bilaterally.  Cardiac:  Regular rate and rhythm with a normal S1 and S2.  There are no murmurs, rubs, or bruits. Abdomen:  Soft.  She has good bowel sounds.  There is no fluid wave. There is no palpable abdominal mass.  There is no palpable hepatosplenomegaly.  Back:  No tenderness of the spine, ribs, or hips. She has some slight kyphosis.  Extremities:  Does show some erythema and mild swelling on the hands.  She has good range of motion of her joints. Skin:  Shows no rashes outside of what is on her hands consistent with a PPE.  Neurological:  Shows no focal neurological deficits.  LABORATORY STUDIES:  White  cell count is 6, hemoglobin 11.9, hematocrit 36, platelet count 121.  IMPRESSION:  Anna Benjamin is a very charming 78 year old white female. She has metastatic breast cancer.  She is on Xeloda.  Her CA 27.29 is going down.  We will go ahead and just follow her along.  We will give her Zometa today.  I will then plan for Zometa in 2 months.  She knows to take the Xeloda 14 days out of 21.  I will plan to see her back in 2 months' time.  Again, as long as her CA 27.29 decreases, we do not have to do any scans.  I spent a good half hour with her.  We have to try to get her schedule rearranged and try to get the financial issues taken care of.  I reviewed all the lab work with her.

## 2013-08-29 ENCOUNTER — Encounter: Payer: Self-pay | Admitting: *Deleted

## 2013-09-30 ENCOUNTER — Other Ambulatory Visit: Payer: Self-pay | Admitting: *Deleted

## 2013-09-30 MED ORDER — CEFUROXIME AXETIL 500 MG PO TABS: 500.0000 mg | ORAL_TABLET | Freq: Two times a day (BID) | ORAL | Status: DC

## 2013-10-05 ENCOUNTER — Other Ambulatory Visit: Payer: Self-pay | Admitting: Family Medicine

## 2013-10-22 ENCOUNTER — Ambulatory Visit (HOSPITAL_BASED_OUTPATIENT_CLINIC_OR_DEPARTMENT_OTHER): Payer: Medicare Other

## 2013-10-22 ENCOUNTER — Other Ambulatory Visit (HOSPITAL_BASED_OUTPATIENT_CLINIC_OR_DEPARTMENT_OTHER): Payer: Medicare Other | Admitting: Lab

## 2013-10-22 ENCOUNTER — Encounter: Payer: Self-pay | Admitting: Hematology & Oncology

## 2013-10-22 ENCOUNTER — Ambulatory Visit (HOSPITAL_BASED_OUTPATIENT_CLINIC_OR_DEPARTMENT_OTHER): Payer: Medicare Other | Admitting: Hematology & Oncology

## 2013-10-22 VITALS — BP 116/59 | HR 86 | Temp 98.1°F | Resp 14 | Ht <= 58 in | Wt 120.0 lb

## 2013-10-22 DIAGNOSIS — L539 Erythematous condition, unspecified: Secondary | ICD-10-CM

## 2013-10-22 DIAGNOSIS — M7989 Other specified soft tissue disorders: Secondary | ICD-10-CM

## 2013-10-22 DIAGNOSIS — M810 Age-related osteoporosis without current pathological fracture: Secondary | ICD-10-CM

## 2013-10-22 DIAGNOSIS — C7951 Secondary malignant neoplasm of bone: Secondary | ICD-10-CM

## 2013-10-22 DIAGNOSIS — C7952 Secondary malignant neoplasm of bone marrow: Secondary | ICD-10-CM

## 2013-10-22 DIAGNOSIS — C787 Secondary malignant neoplasm of liver and intrahepatic bile duct: Secondary | ICD-10-CM

## 2013-10-22 DIAGNOSIS — F419 Anxiety disorder, unspecified: Secondary | ICD-10-CM

## 2013-10-22 DIAGNOSIS — C50119 Malignant neoplasm of central portion of unspecified female breast: Secondary | ICD-10-CM

## 2013-10-22 DIAGNOSIS — C50919 Malignant neoplasm of unspecified site of unspecified female breast: Secondary | ICD-10-CM

## 2013-10-22 LAB — CMP (CANCER CENTER ONLY)
ALT: 16 U/L (ref 10–47)
AST: 31 U/L (ref 11–38)
Albumin: 3.7 g/dL (ref 3.3–5.5)
Alkaline Phosphatase: 93 U/L — ABNORMAL HIGH (ref 26–84)
BUN, Bld: 19 mg/dL (ref 7–22)
CHLORIDE: 101 meq/L (ref 98–108)
CO2: 29 mEq/L (ref 18–33)
Calcium: 9.3 mg/dL (ref 8.0–10.3)
Creat: 0.9 mg/dl (ref 0.6–1.2)
Glucose, Bld: 122 mg/dL — ABNORMAL HIGH (ref 73–118)
POTASSIUM: 3.1 meq/L — AB (ref 3.3–4.7)
SODIUM: 139 meq/L (ref 128–145)
TOTAL PROTEIN: 6.4 g/dL (ref 6.4–8.1)
Total Bilirubin: 1 mg/dl (ref 0.20–1.60)

## 2013-10-22 LAB — CBC WITH DIFFERENTIAL (CANCER CENTER ONLY)
BASO#: 0 10*3/uL (ref 0.0–0.2)
BASO%: 0.3 % (ref 0.0–2.0)
EOS%: 0.1 % (ref 0.0–7.0)
Eosinophils Absolute: 0 10*3/uL (ref 0.0–0.5)
HEMATOCRIT: 34.3 % — AB (ref 34.8–46.6)
HEMOGLOBIN: 12.2 g/dL (ref 11.6–15.9)
LYMPH#: 2.2 10*3/uL (ref 0.9–3.3)
LYMPH%: 28.3 % (ref 14.0–48.0)
MCH: 38.6 pg — ABNORMAL HIGH (ref 26.0–34.0)
MCHC: 35.6 g/dL (ref 32.0–36.0)
MCV: 109 fL — ABNORMAL HIGH (ref 81–101)
MONO#: 0.6 10*3/uL (ref 0.1–0.9)
MONO%: 7.4 % (ref 0.0–13.0)
NEUT%: 63.9 % (ref 39.6–80.0)
NEUTROS ABS: 4.9 10*3/uL (ref 1.5–6.5)
PLATELETS: 187 10*3/uL (ref 145–400)
RBC: 3.16 10*6/uL — AB (ref 3.70–5.32)
RDW: 14.7 % (ref 11.1–15.7)
WBC: 7.7 10*3/uL (ref 3.9–10.0)

## 2013-10-22 LAB — CANCER ANTIGEN 27.29: CA 27.29: 557 U/mL — ABNORMAL HIGH (ref 0–39)

## 2013-10-22 MED ORDER — SODIUM CHLORIDE 0.9 % IJ SOLN
3.0000 mL | Freq: Once | INTRAMUSCULAR | Status: DC | PRN
  Filled 2013-10-22: qty 10

## 2013-10-22 MED ORDER — HEPARIN SOD (PORK) LOCK FLUSH 100 UNIT/ML IV SOLN
500.0000 [IU] | Freq: Once | INTRAVENOUS | Status: DC | PRN
  Filled 2013-10-22: qty 5

## 2013-10-22 MED ORDER — CAPECITABINE 500 MG PO TABS: ORAL_TABLET | ORAL | Status: DC

## 2013-10-22 MED ORDER — HYDROCODONE-ACETAMINOPHEN 10-325 MG PO TABS: ORAL_TABLET | ORAL | Status: DC

## 2013-10-22 MED ORDER — SODIUM CHLORIDE 0.9 % IJ SOLN
10.0000 mL | INTRAMUSCULAR | Status: DC | PRN
  Filled 2013-10-22: qty 10

## 2013-10-22 MED ORDER — ZOLEDRONIC ACID 4 MG/100ML IV SOLN
4.0000 mg | Freq: Once | INTRAVENOUS | Status: AC
  Administered 2013-10-22: 4 mg via INTRAVENOUS
  Filled 2013-10-22: qty 100

## 2013-10-22 MED ORDER — ALTEPLASE 2 MG IJ SOLR
2.0000 mg | Freq: Once | INTRAMUSCULAR | Status: DC | PRN
  Filled 2013-10-22: qty 2

## 2013-10-22 MED ORDER — OMEPRAZOLE 20 MG PO CPDR: DELAYED_RELEASE_CAPSULE | ORAL | Status: DC

## 2013-10-22 NOTE — Progress Notes (Signed)
Hematology and Oncology Follow Up Visit  Anna Benjamin [id number] 03-14-1936 78 y.o. 10/22/2013   Principle Diagnosis:   Metastatic breast cancer-   ER positive/HER2 (-)  Current Therapy:    Xeloda 1000 mg by mouth twice a day for 14 days/7 off  Zometa 4 mg IV Q2 months     Interim History:  [name] is back for followup. She now is starting to have skin problems from the Xeloda. She's having a lot of erythema and cracking of her hands. She's having some swelling in the hands. She has some blisters on the bottom of her left foot. This seemed to happen the second week of treatment.  Will cut her Xeloda dose back to 1000 mg twice a day and see a she does with this.  Otherwise, she feels okay. She's had no nausea vomiting. Route has been okay. She's had no abdominal pain. There's been no diarrhea. She's had no fever. She's had no mouth sores.  Her last CA 27.29 was down to 448..  Medications: Current outpatient prescriptions:aspirin 81 MG chewable tablet, Chew 81 mg by mouth daily.  , Disp: , Rfl: ;  Calcium Carbonate-Vit D-Min (CALCIUM 1200 PO), Take by mouth every morning., Disp: , Rfl: ;  capecitabine (XELODA) 500 MG tablet, Take 2 tablets twice a day with food for 14 days and off for 7 days., Disp: 64 tablet, Rfl: 4;  cholecalciferol (VITAMIN D) 1000 UNITS tablet, Take 1,000 Units by mouth daily.  , Disp: , Rfl:  Dextromethorphan-Guaifenesin (MUCINEX DM MAXIMUM STRENGTH) 60-1200 MG TB12, Take by mouth 2 (two) times daily., Disp: , Rfl: ;  Docusate Sodium (COLACE PO), Take by mouth every morning. 3 po q day., Disp: , Rfl: ;  fexofenadine (ALLEGRA) 180 MG tablet, Take 180 mg by mouth daily.  , Disp: , Rfl: ;  fluticasone (FLONASE) 50 MCG/ACT nasal spray, Place 2 sprays into the nose as needed., Disp: , Rfl:  HYDROcodone-acetaminophen (NORCO) 10-325 MG per tablet, 1 - 2 tabs by mouth every 6 hours as needed for pain secondary to cancer., Disp: 180 tablet, Rfl: 0;   lisinopril-hydrochlorothiazide (PRINZIDE,ZESTORETIC) 10-12.5 MG per tablet, Take 1 tablet by mouth daily., Disp: 30 tablet, Rfl: 12;  LORazepam (ATIVAN) 0.5 MG tablet, Take 1 tablet (0.5 mg total) by mouth every 8 (eight) hours as needed., Disp: 90 tablet, Rfl: 0 megestrol (MEGACE) 40 MG tablet, TAKES 1/2 TAB DAILY. Total of 66m/day, Disp: 30 tablet, Rfl: 2;  omeprazole (PRILOSEC) 20 MG capsule, 1 capsule by mouth daily--office visit due now, Disp: 30 capsule, Rfl: 0;  Pyridoxine HCl (VITAMIN B-6) 250 MG tablet, Take 1 tablet (250 mg total) by mouth daily., Disp: 30 tablet, Rfl: 6;  traZODone (DESYREL) 50 MG tablet, Take 2 pills at bedtime, if needed, for sleep., Disp: 60 tablet, Rfl: 4 triamterene-hydrochlorothiazide (MAXZIDE-25) 37.5-25 MG per tablet, Take 1 tablet by mouth daily as needed., Disp: 30 tablet, Rfl: 1;  ondansetron (ZOFRAN) 8 MG tablet, TAKE 1 TABLET BY MOUTH EVERY 8 HOURS AS NEEDED FOR NAUSEA, Disp: 30 tablet, Rfl: 0 No current facility-administered medications for this visit. Facility-Administered Medications Ordered in Other Visits: alteplase (CATHFLO ACTIVASE) injection 2 mg, 2 mg, Intracatheter, Once PRN, PVolanda Napoleon MD;  heparin lock flush 100 unit/mL, 500 Units, Intracatheter, Once PRN, PVolanda Napoleon MD;  sodium chloride 0.9 % injection 10 mL, 10 mL, Intracatheter, PRN, PVolanda Napoleon MD;  sodium chloride 0.9 % injection 3 mL, 3 mL, Intravenous, Once PRN, PVolanda Napoleon  MD  Allergies: No Known Allergies  Past Medical History, Surgical history, Social history, and Family History were reviewed and updated.  Review of Systems: As above  Physical Exam:  height is _0  (1.473 m) and weight is 120 lb (54.432 kg). Her oral temperature is 98.1 F (36.7 C). Her blood pressure is 116/59 and her pulse is 86. Her respiration is 14.   Lungs are clear. Oral exam is without mucositis. Neck is supple with no adenopathy. Ocular exam shows no sclera icterus. Cardiac exam regular  in rhythm. Abdomen is soft. She has good bowel sounds. No fluid wave. There is no palpable liver or spleen tip. Back exam no tenderness over the spine. Chest slight kyphosis. Extremities shows no clubbing cyanosis or edema. She's good range most of her joints. Skin exam does show erythema on the palms of the hands and soles of the feet. Some cracking of the skin is noted. Neurological exam is nonfocal.  Lab Results  Component Value Date   WBC 7.7 10/22/2013   HGB 12.2 10/22/2013   HCT 34.3* 10/22/2013   MCV 109* 10/22/2013   PLT 187 10/22/2013     Chemistry      Component Value Date/Time   NA 139 10/22/2013 1313   NA 135 04/08/2013 1207   K 3.1* 10/22/2013 1313   K 3.7 04/08/2013 1207   CL 101 10/22/2013 1313   CL 102 04/08/2013 1207   CO2 29 10/22/2013 1313   CO2 27 04/08/2013 1207   BUN 19 10/22/2013 1313   BUN 16 04/08/2013 1207   CREATININE 0.9 10/22/2013 1313   CREATININE 0.86 04/08/2013 1207      Component Value Date/Time   CALCIUM 9.3 10/22/2013 1313   CALCIUM 9.8 04/08/2013 1207   ALKPHOS 93* 10/22/2013 1313   ALKPHOS 52 11/05/2012 1024   AST 31 10/22/2013 1313   AST 18 11/05/2012 1024   ALT 16 10/22/2013 1313   ALT 17 11/05/2012 1024   BILITOT 1.00 10/22/2013 1313   BILITOT 0.6 11/05/2012 1024         Impression and Plan: Anna Benjamin is 78 year old white female with metastatic breast cancer. She is responding very nicely. Her CA 27.29 is coming down. We will see what the level is today.  Because of financial issues, she really wants to avoid x-ray again I think as long as she is doing well, I can understand this. As long as her CA 27-29 is coming down, and her labs look good, we can hold off on x-rays.  We will get her back in 2 more months.  I did refill her hydrocodone.   Volanda Napoleon, MD 5/15/20153:11 PM metastatic breast cancer

## 2013-10-22 NOTE — Patient Instructions (Signed)

## 2013-11-10 ENCOUNTER — Other Ambulatory Visit: Payer: Self-pay | Admitting: Hematology & Oncology

## 2013-12-06 ENCOUNTER — Other Ambulatory Visit: Payer: Self-pay | Admitting: Hematology & Oncology

## 2013-12-13 ENCOUNTER — Telehealth: Payer: Self-pay | Admitting: Hematology & Oncology

## 2013-12-13 NOTE — Telephone Encounter (Signed)
Pt has been approved for reimbursement for cost sharing associated with specific, documented out-of-pocket medication expenses related to Metastatic Breast Cancer.  Valid: 06/23/2013 - 06/22/2014 Grant: $[id number],500 PRN: [id number]     COPY SCANNED

## 2013-12-23 ENCOUNTER — Ambulatory Visit (HOSPITAL_BASED_OUTPATIENT_CLINIC_OR_DEPARTMENT_OTHER): Payer: Medicare Other | Admitting: Lab

## 2013-12-23 ENCOUNTER — Ambulatory Visit (HOSPITAL_BASED_OUTPATIENT_CLINIC_OR_DEPARTMENT_OTHER): Payer: Medicare Other

## 2013-12-23 ENCOUNTER — Ambulatory Visit (HOSPITAL_BASED_OUTPATIENT_CLINIC_OR_DEPARTMENT_OTHER): Payer: Medicare Other | Admitting: Hematology & Oncology

## 2013-12-23 VITALS — BP 136/65 | HR 68 | Temp 97.0°F | Resp 18 | Ht <= 58 in | Wt 120.4 lb

## 2013-12-23 DIAGNOSIS — C50919 Malignant neoplasm of unspecified site of unspecified female breast: Secondary | ICD-10-CM

## 2013-12-23 DIAGNOSIS — C7952 Secondary malignant neoplasm of bone marrow: Secondary | ICD-10-CM

## 2013-12-23 DIAGNOSIS — C50119 Malignant neoplasm of central portion of unspecified female breast: Secondary | ICD-10-CM

## 2013-12-23 DIAGNOSIS — C7951 Secondary malignant neoplasm of bone: Secondary | ICD-10-CM

## 2013-12-23 DIAGNOSIS — R971 Elevated cancer antigen 125 [CA 125]: Secondary | ICD-10-CM

## 2013-12-23 DIAGNOSIS — F419 Anxiety disorder, unspecified: Secondary | ICD-10-CM

## 2013-12-23 DIAGNOSIS — M810 Age-related osteoporosis without current pathological fracture: Secondary | ICD-10-CM

## 2013-12-23 LAB — CMP (CANCER CENTER ONLY)
ALT(SGPT): 29 U/L (ref 10–47)
AST: 55 U/L — AB (ref 11–38)
Albumin: 3.8 g/dL (ref 3.3–5.5)
Alkaline Phosphatase: 144 U/L — ABNORMAL HIGH (ref 26–84)
BUN: 23 mg/dL — AB (ref 7–22)
CALCIUM: 9.1 mg/dL (ref 8.0–10.3)
CHLORIDE: 102 meq/L (ref 98–108)
CO2: 27 mEq/L (ref 18–33)
CREATININE: 0.9 mg/dL (ref 0.6–1.2)
GLUCOSE: 98 mg/dL (ref 73–118)
POTASSIUM: 3.8 meq/L (ref 3.3–4.7)
Sodium: 148 mEq/L — ABNORMAL HIGH (ref 128–145)
Total Bilirubin: 1.2 mg/dl (ref 0.20–1.60)
Total Protein: 6.9 g/dL (ref 6.4–8.1)

## 2013-12-23 LAB — CBC WITH DIFFERENTIAL (CANCER CENTER ONLY)
BASO#: 0 10*3/uL (ref 0.0–0.2)
BASO%: 0.2 % (ref 0.0–2.0)
EOS ABS: 0 10*3/uL (ref 0.0–0.5)
EOS%: 0.2 % (ref 0.0–7.0)
HCT: 37.6 % (ref 34.8–46.6)
HGB: 12.7 g/dL (ref 11.6–15.9)
LYMPH#: 2.1 10*3/uL (ref 0.9–3.3)
LYMPH%: 26.3 % (ref 14.0–48.0)
MCH: 36.8 pg — AB (ref 26.0–34.0)
MCHC: 33.8 g/dL (ref 32.0–36.0)
MCV: 109 fL — ABNORMAL HIGH (ref 81–101)
MONO#: 0.9 10*3/uL (ref 0.1–0.9)
MONO%: 11 % (ref 0.0–13.0)
NEUT#: 5 10*3/uL (ref 1.5–6.5)
NEUT%: 62.3 % (ref 39.6–80.0)
Platelets: 150 10*3/uL (ref 145–400)
RBC: 3.45 10*6/uL — ABNORMAL LOW (ref 3.70–5.32)
RDW: 14.5 % (ref 11.1–15.7)
WBC: 8 10*3/uL (ref 3.9–10.0)

## 2013-12-23 LAB — CANCER ANTIGEN 27.29: CA 27.29: 1638 U/mL — ABNORMAL HIGH (ref 0–39)

## 2013-12-23 LAB — LACTATE DEHYDROGENASE: LDH: 278 U/L — ABNORMAL HIGH (ref 94–250)

## 2013-12-23 MED ORDER — ZOLEDRONIC ACID 4 MG/100ML IV SOLN
4.0000 mg | Freq: Once | INTRAVENOUS | Status: AC
  Administered 2013-12-23: 4 mg via INTRAVENOUS
  Filled 2013-12-23: qty 100

## 2013-12-23 MED ORDER — HYDROCODONE-ACETAMINOPHEN 10-325 MG PO TABS: ORAL_TABLET | ORAL | Status: DC

## 2013-12-23 MED ORDER — LORAZEPAM 0.5 MG PO TABS: 0.5000 mg | ORAL_TABLET | Freq: Three times a day (TID) | ORAL | Status: DC | PRN

## 2013-12-23 NOTE — Patient Instructions (Signed)

## 2013-12-23 NOTE — Addendum Note (Signed)
Addended by: Orlando Penner on: 12/23/2013 05:06 PM   Modules accepted: Medications

## 2013-12-23 NOTE — Progress Notes (Signed)
Hematology and Oncology Follow Up Visit  Anna Benjamin [id number] July 17, 1935 78 y.o. 12/23/2013   Principle Diagnosis:   Metastatic breast cancer-   ER positive/HER2 (-)  Current Therapy:    Xeloda 1000 mg by mouth twice a day for 14 days/7 off  Zometa 4 mg IV Q2 months     Interim History:  Ms.  Anna Benjamin is back for followup. She now is starting to have skin problems from the Xeloda. Her hands but a lot better. There is very little redness and cracking.  The problem now is that she has a fractured toe on the left foot. This is the second toe. She's going to need surgery for this. She will see an orthopedist in a couple weeks. Hopefully this can be taken care of so she will not have as much pain. Her foot is her biggest problem right now.  Her appetite is okay. She has had no problems with bowels or bladder. She has had no nausea or vomiting. There's been no cough or shortness of breath.  Her last CA 27.29 was up a little bit. I think it was 557. We will have to watch this closely. She really has a hard time pain for any x-ray studies.  Medications: Current outpatient prescriptions:aspirin 81 MG chewable tablet, Chew 81 mg by mouth daily.  , Disp: , Rfl: ;  Calcium Carbonate-Vit D-Min (CALCIUM 1200 PO), Take by mouth every morning., Disp: , Rfl: ;  capecitabine (XELODA) 500 MG tablet, Take 2 tablets twice a day with food for 14 days and off for 7 days., Disp: 64 tablet, Rfl: 4;  cholecalciferol (VITAMIN D) 1000 UNITS tablet, Take 1,000 Units by mouth daily.  , Disp: , Rfl:  Dextromethorphan-Guaifenesin (MUCINEX DM MAXIMUM STRENGTH) 60-1200 MG TB12, Take by mouth 2 (two) times daily., Disp: , Rfl: ;  Docusate Sodium (COLACE PO), Take by mouth every morning. 3 po q day., Disp: , Rfl: ;  fexofenadine (ALLEGRA) 180 MG tablet, Take 180 mg by mouth daily.  , Disp: , Rfl: ;  fluticasone (FLONASE) 50 MCG/ACT nasal spray, Place 2 sprays into the nose as needed., Disp: , Rfl:   HYDROcodone-acetaminophen (NORCO) 10-325 MG per tablet, 1 - 2 tabs by mouth every 6 hours as needed for pain secondary to cancer., Disp: 180 tablet, Rfl: 0;  lisinopril-hydrochlorothiazide (PRINZIDE,ZESTORETIC) 10-12.5 MG per tablet, Take 1 tablet by mouth daily., Disp: 30 tablet, Rfl: 12;  LORazepam (ATIVAN) 0.5 MG tablet, Take 1 tablet (0.5 mg total) by mouth every 8 (eight) hours as needed for anxiety., Disp: 90 tablet, Rfl: 0 megestrol (MEGACE) 40 MG tablet, TAKES 1/2 TAB DAILY. Total of 29m/day, Disp: 30 tablet, Rfl: 2;  omeprazole (PRILOSEC) 20 MG capsule, 1 capsule by mouth daily--office visit due now, Disp: 30 capsule, Rfl: 0;  ondansetron (ZOFRAN) 8 MG tablet, TAKE 1 TABLET BY MOUTH EVERY 8 HOURS AS NEEDED FOR NAUSEA, Disp: 30 tablet, Rfl: 0;  Pyridoxine HCl (VITAMIN B-6) 250 MG tablet, Take 1 tablet (250 mg total) by mouth daily., Disp: 30 tablet, Rfl: 6 traZODone (DESYREL) 50 MG tablet, Take 2 pills at bedtime, if needed, for sleep., Disp: 60 tablet, Rfl: 4;  triamterene-hydrochlorothiazide (MAXZIDE-25) 37.5-25 MG per tablet, Take 1 tablet by mouth daily as needed., Disp: 30 tablet, Rfl: 1  Allergies: No Known Allergies  Past Medical History, Surgical history, Social history, and Family History were reviewed and updated.  Review of Systems: As above  Physical Exam:  height is 4' 10"  (1.473 m) and weight  is 120 lb 6.4 oz (54.613 kg). Her oral temperature is 97 F (36.1 C). Her blood pressure is 136/65 and her pulse is 68. Her respiration is 18.   Lungs are clear. Oral exam is without mucositis. Neck is supple with no adenopathy. Ocular exam shows no sclera icterus. Cardiac exam regular in rhythm. Abdomen is soft. She has good bowel sounds. No fluid wave. There is no palpable liver or spleen tip. Back exam no tenderness over the spine. Chest slight kyphosis. Extremities shows no clubbing cyanosis or edema. She's good range most of her joints. Skin exam does show erythema on the palms of  the hands and soles of the feet. Some cracking of the skin is noted. Neurological exam is nonfocal.  Lab Results  Component Value Date   WBC 8.0 12/23/2013   HGB 12.7 12/23/2013   HCT 37.6 12/23/2013   MCV 109* 12/23/2013   PLT 150 12/23/2013     Chemistry      Component Value Date/Time   NA 148* 12/23/2013 1320   NA 135 04/08/2013 1207   K 3.8 12/23/2013 1320   K 3.7 04/08/2013 1207   CL 102 12/23/2013 1320   CL 102 04/08/2013 1207   CO2 27 12/23/2013 1320   CO2 27 04/08/2013 1207   BUN 23* 12/23/2013 1320   BUN 16 04/08/2013 1207   CREATININE 0.9 12/23/2013 1320   CREATININE 0.86 04/08/2013 1207      Component Value Date/Time   CALCIUM 9.1 12/23/2013 1320   CALCIUM 9.8 04/08/2013 1207   ALKPHOS 144* 12/23/2013 1320   ALKPHOS 52 11/05/2012 1024   AST 55* 12/23/2013 1320   AST 18 11/05/2012 1024   ALT 29 12/23/2013 1320   ALT 17 11/05/2012 1024   BILITOT 1.20 12/23/2013 1320   BILITOT 0.6 11/05/2012 1024         Impression and Plan: [name] is 78 year old white female with metastatic breast cancer. She has been responding. Again, the CA 27.29 is up a little but more. We'll have to see what the level is today. We find that it continues to rise, then we are going to have to be forced to do some kind of study to find out what is going on.  I did refill her pain medication and anxiety medication and today. These really do help route quite a bit.  She really likes or have become every couple months. This does help with respect to her finances..  We will get her back in 2 more months.    Volanda Napoleon, MD 7/16/20153:37 PM

## 2013-12-24 ENCOUNTER — Ambulatory Visit (HOSPITAL_BASED_OUTPATIENT_CLINIC_OR_DEPARTMENT_OTHER): Payer: Medicare Other | Admitting: Hematology & Oncology

## 2013-12-24 ENCOUNTER — Other Ambulatory Visit: Payer: Self-pay | Admitting: *Deleted

## 2013-12-24 VITALS — BP 134/78 | HR 85 | Temp 96.8°F | Resp 16

## 2013-12-24 DIAGNOSIS — C50119 Malignant neoplasm of central portion of unspecified female breast: Secondary | ICD-10-CM

## 2013-12-24 DIAGNOSIS — F419 Anxiety disorder, unspecified: Secondary | ICD-10-CM

## 2013-12-24 DIAGNOSIS — C50919 Malignant neoplasm of unspecified site of unspecified female breast: Secondary | ICD-10-CM

## 2013-12-24 DIAGNOSIS — C7951 Secondary malignant neoplasm of bone: Secondary | ICD-10-CM

## 2013-12-24 DIAGNOSIS — C7952 Secondary malignant neoplasm of bone marrow: Secondary | ICD-10-CM

## 2013-12-24 MED ORDER — LORAZEPAM 0.5 MG PO TABS: 0.5000 mg | ORAL_TABLET | Freq: Three times a day (TID) | ORAL | Status: DC | PRN

## 2013-12-24 MED ORDER — MEGESTROL ACETATE 40 MG PO TABS: ORAL_TABLET | ORAL | Status: DC

## 2013-12-24 MED ORDER — HYDROCODONE-ACETAMINOPHEN 10-325 MG PO TABS: ORAL_TABLET | ORAL | Status: DC

## 2013-12-24 NOTE — Addendum Note (Signed)
Addended by: Burney Gauze R on: 12/24/2013 12:42 PM   Modules accepted: Orders

## 2013-12-25 NOTE — Addendum Note (Signed)
Addended by: Burney Gauze R on: 12/25/2013 07:40 AM   Modules accepted: Level of Service

## 2013-12-25 NOTE — Progress Notes (Signed)
I dictated a note on 7/18

## 2013-12-25 NOTE — Progress Notes (Signed)
I had Mrs. Prindle come back to see me. She is in, [place], getting her medications. I had written for them yesterday. She just forgot to pick them up after her Zometa.  With her lab work yesterday, we found that her CA 27.29 was now over 1500. This has always been in very good indicator for her breast cancer progressing.  Her liver function tests are also slightly elevated.  She is on Xeloda. I told her to stop this.  I feel that we need to evaluate her radiologically. The problem, that she is in a financial bind. She does does not have a lot of resources and she really wants to avoid doing studies if possible.  I explained to her the importance of doing a PET scan on her again. She understands this.  I also told her that we probably are going to need a biopsy. I need to see if she is still estrogen positive. More poorly, I need to see if she is HER-2 positive.  Otherwise, she feels well. She's been okay. He really had no problems with the Xeloda. We decreased her dose a little bit which has helped.  She's not had any issues with nausea or vomiting. His been no diarrhea.  Her physical exam shows stable vital signs. Her blood pressure is 134/78.. Temperature is 96.8. Pulse is 85.   Her exam shows no hepatomegaly. There is no ascites. She has no splenomegaly. Lungs are clear. Cardiac exam regular in rhythm. Extremities shows no clubbing cyanosis or edema. She does have some slight erythema or hands. Neurological exam is nonfocal.  I spent about 30 minutes with her. Again, I explained why we had to make changes. I explained why we needed to do the scans and biopsy. She does understand this. She's been on Xeloda for probably a year. We certainly have gotten some good mileage out of it.

## 2013-12-27 ENCOUNTER — Encounter: Payer: Self-pay | Admitting: Hematology & Oncology

## 2014-01-03 ENCOUNTER — Ambulatory Visit (HOSPITAL_COMMUNITY): Payer: Medicare Other

## 2014-01-04 ENCOUNTER — Ambulatory Visit (HOSPITAL_COMMUNITY)
Admission: RE | Admit: 2014-01-04 | Discharge: 2014-01-04 | Disposition: A | Discharge status: Home / Self Care | Payer: Medicare Other | Source: Ambulatory Visit | Attending: Hematology & Oncology | Admitting: Hematology & Oncology

## 2014-01-04 DIAGNOSIS — R091 Pleurisy: Secondary | ICD-10-CM | POA: Diagnosis not present

## 2014-01-04 DIAGNOSIS — M439 Deforming dorsopathy, unspecified: Secondary | ICD-10-CM | POA: Diagnosis not present

## 2014-01-04 DIAGNOSIS — C7952 Secondary malignant neoplasm of bone marrow: Secondary | ICD-10-CM

## 2014-01-04 DIAGNOSIS — K769 Liver disease, unspecified: Secondary | ICD-10-CM | POA: Insufficient documentation

## 2014-01-04 DIAGNOSIS — M949 Disorder of cartilage, unspecified: Secondary | ICD-10-CM

## 2014-01-04 DIAGNOSIS — Z90710 Acquired absence of both cervix and uterus: Secondary | ICD-10-CM | POA: Insufficient documentation

## 2014-01-04 DIAGNOSIS — M899 Disorder of bone, unspecified: Secondary | ICD-10-CM | POA: Insufficient documentation

## 2014-01-04 DIAGNOSIS — N8189 Other female genital prolapse: Secondary | ICD-10-CM | POA: Insufficient documentation

## 2014-01-04 DIAGNOSIS — C787 Secondary malignant neoplasm of liver and intrahepatic bile duct: Secondary | ICD-10-CM | POA: Insufficient documentation

## 2014-01-04 DIAGNOSIS — C7951 Secondary malignant neoplasm of bone: Secondary | ICD-10-CM | POA: Diagnosis not present

## 2014-01-04 DIAGNOSIS — I517 Cardiomegaly: Secondary | ICD-10-CM | POA: Insufficient documentation

## 2014-01-04 DIAGNOSIS — N289 Disorder of kidney and ureter, unspecified: Secondary | ICD-10-CM | POA: Diagnosis not present

## 2014-01-04 DIAGNOSIS — C50919 Malignant neoplasm of unspecified site of unspecified female breast: Secondary | ICD-10-CM | POA: Diagnosis present

## 2014-01-04 LAB — GLUCOSE, CAPILLARY: GLUCOSE-CAPILLARY: 107 mg/dL — AB (ref 70–99)

## 2014-01-04 MED ORDER — FLUDEOXYGLUCOSE F - 18 (FDG) INJECTION
7.1000 | Freq: Once | INTRAVENOUS | Status: AC | PRN
  Administered 2014-01-04: 7.1 via INTRAVENOUS

## 2014-01-07 ENCOUNTER — Other Ambulatory Visit: Payer: Self-pay | Admitting: Hematology & Oncology

## 2014-01-07 DIAGNOSIS — C50919 Malignant neoplasm of unspecified site of unspecified female breast: Secondary | ICD-10-CM

## 2014-01-10 ENCOUNTER — Telehealth: Payer: Self-pay | Admitting: Hematology & Oncology

## 2014-01-10 ENCOUNTER — Other Ambulatory Visit: Payer: Self-pay | Admitting: Hematology & Oncology

## 2014-01-10 DIAGNOSIS — C50919 Malignant neoplasm of unspecified site of unspecified female breast: Secondary | ICD-10-CM

## 2014-01-10 NOTE — Telephone Encounter (Signed)
Anna Benjamin in scheduling aware of bx and is putting it under reveiw

## 2014-01-11 ENCOUNTER — Encounter (HOSPITAL_COMMUNITY): Payer: Self-pay | Admitting: Pharmacy Technician

## 2014-01-11 ENCOUNTER — Other Ambulatory Visit: Payer: Self-pay | Admitting: Radiology

## 2014-01-13 ENCOUNTER — Ambulatory Visit (HOSPITAL_COMMUNITY)
Admission: RE | Admit: 2014-01-13 | Discharge: 2014-01-13 | Disposition: A | Discharge status: Home / Self Care | Payer: Medicare Other | Source: Ambulatory Visit | Attending: Hematology & Oncology | Admitting: Hematology & Oncology

## 2014-01-13 ENCOUNTER — Encounter (HOSPITAL_COMMUNITY): Payer: Self-pay

## 2014-01-13 DIAGNOSIS — E785 Hyperlipidemia, unspecified: Secondary | ICD-10-CM | POA: Diagnosis not present

## 2014-01-13 DIAGNOSIS — C50919 Malignant neoplasm of unspecified site of unspecified female breast: Secondary | ICD-10-CM | POA: Insufficient documentation

## 2014-01-13 DIAGNOSIS — C787 Secondary malignant neoplasm of liver and intrahepatic bile duct: Secondary | ICD-10-CM | POA: Diagnosis not present

## 2014-01-13 DIAGNOSIS — Z79899 Other long term (current) drug therapy: Secondary | ICD-10-CM | POA: Insufficient documentation

## 2014-01-13 DIAGNOSIS — Z90710 Acquired absence of both cervix and uterus: Secondary | ICD-10-CM | POA: Insufficient documentation

## 2014-01-13 DIAGNOSIS — K7689 Other specified diseases of liver: Secondary | ICD-10-CM | POA: Diagnosis present

## 2014-01-13 DIAGNOSIS — Z7982 Long term (current) use of aspirin: Secondary | ICD-10-CM | POA: Diagnosis not present

## 2014-01-13 DIAGNOSIS — I10 Essential (primary) hypertension: Secondary | ICD-10-CM | POA: Insufficient documentation

## 2014-01-13 LAB — CBC WITH DIFFERENTIAL/PLATELET
BASOS ABS: 0 10*3/uL (ref 0.0–0.1)
Basophils Relative: 0 % (ref 0–1)
Eosinophils Absolute: 0 10*3/uL (ref 0.0–0.7)
Eosinophils Relative: 0 % (ref 0–5)
HCT: 36.7 % (ref 36.0–46.0)
Hemoglobin: 12.2 g/dL (ref 12.0–15.0)
LYMPHS PCT: 20 % (ref 12–46)
Lymphs Abs: 1.7 10*3/uL (ref 0.7–4.0)
MCH: 34.4 pg — ABNORMAL HIGH (ref 26.0–34.0)
MCHC: 33.2 g/dL (ref 30.0–36.0)
MCV: 103.4 fL — AB (ref 78.0–100.0)
Monocytes Absolute: 0.8 10*3/uL (ref 0.1–1.0)
Monocytes Relative: 10 % (ref 3–12)
NEUTROS ABS: 6.2 10*3/uL (ref 1.7–7.7)
Neutrophils Relative %: 70 % (ref 43–77)
Platelets: 176 10*3/uL (ref 150–400)
RBC: 3.55 MIL/uL — ABNORMAL LOW (ref 3.87–5.11)
RDW: 14.2 % (ref 11.5–15.5)
WBC: 8.8 10*3/uL (ref 4.0–10.5)

## 2014-01-13 LAB — APTT: APTT: 21 s — AB (ref 24–37)

## 2014-01-13 LAB — PROTIME-INR
INR: 0.97 (ref 0.00–1.49)
PROTHROMBIN TIME: 12.9 s (ref 11.6–15.2)

## 2014-01-13 MED ORDER — FENTANYL CITRATE 0.05 MG/ML IJ SOLN
INTRAMUSCULAR | Status: AC
  Filled 2014-01-13: qty 2

## 2014-01-13 MED ORDER — MIDAZOLAM HCL 2 MG/2ML IJ SOLN
INTRAMUSCULAR | Status: AC | PRN
  Administered 2014-01-13: 1 mg via INTRAVENOUS

## 2014-01-13 MED ORDER — SODIUM CHLORIDE 0.9 % IV SOLN
INTRAVENOUS | Status: DC
  Administered 2014-01-13: 11:00:00 via INTRAVENOUS

## 2014-01-13 MED ORDER — HYDROCODONE-ACETAMINOPHEN 5-325 MG PO TABS
1.0000 | ORAL_TABLET | ORAL | Status: DC | PRN
  Administered 2014-01-13 (×2): 1 via ORAL
  Filled 2014-01-13 (×2): qty 1

## 2014-01-13 MED ORDER — MIDAZOLAM HCL 2 MG/2ML IJ SOLN
INTRAMUSCULAR | Status: AC
  Filled 2014-01-13: qty 2

## 2014-01-13 MED ORDER — FENTANYL CITRATE 0.05 MG/ML IJ SOLN
INTRAMUSCULAR | Status: AC | PRN
  Administered 2014-01-13 (×2): 50 ug via INTRAVENOUS

## 2014-01-13 NOTE — Discharge Instructions (Signed)
Conscious Sedation, Adult, Care After °Refer to this sheet in the next few weeks. These instructions provide you with information on caring for yourself after your procedure. Your health care provider may also give you more specific instructions. Your treatment has been planned according to current medical practices, but problems sometimes occur. Call your health care provider if you have any problems or questions after your procedure. °WHAT TO EXPECT AFTER THE PROCEDURE  °After your procedure: °· You may feel sleepy, clumsy, and have poor balance for several hours. °· Vomiting may occur if you eat too soon after the procedure. °HOME CARE INSTRUCTIONS °· Do not participate in any activities where you could become injured for at least 24 hours. Do not: °¨ Drive. °¨ Swim. °¨ Ride a bicycle. °¨ Operate heavy machinery. °¨ Cook. °¨ Use power tools. °¨ Climb ladders. °¨ Work from a high place. °· Do not make important decisions or sign legal documents until you are improved. °· If you vomit, drink water, juice, or soup when you can drink without vomiting. Make sure you have little or no nausea before eating solid foods. °· Only take over-the-counter or prescription medicines for pain, discomfort, or fever as directed by your health care provider. °· Make sure you and your family fully understand everything about the medicines given to you, including what side effects may occur. °· You should not drink alcohol, take sleeping pills, or take medicines that cause drowsiness for at least 24 hours. °· If you smoke, do not smoke without supervision. °· If you are feeling better, you may resume normal activities 24 hours after you were sedated. °· Keep all appointments with your health care provider. °SEEK MEDICAL CARE IF: °· Your skin is pale or bluish in color. °· You continue to feel nauseous or vomit. °· Your pain is getting worse and is not helped by medicine. °· You have bleeding or swelling. °· You are still sleepy or  feeling clumsy after 24 hours. °SEEK IMMEDIATE MEDICAL CARE IF: °· You develop a rash. °· You have difficulty breathing. °· You develop any type of allergic problem. °· You have a fever. °MAKE SURE YOU: °· Understand these instructions. °· Will watch your condition. °· Will get help right away if you are not doing well or get worse. °Document Released: 03/17/2013 Document Reviewed: 03/17/2013 °[employer]® Patient Information ©2015 [employer]. This information is not intended to replace advice given to you by your health care provider. Make sure you discuss any questions you have with your health care provider. °Liver Biopsy, Care After °These instructions give you information on caring for yourself after your procedure. Your doctor may also give you more specific instructions. Call your doctor if you have any problems or questions after your procedure. °HOME CARE °· Rest at home for 1-2 days or as told by your doctor. °· Have someone stay with you for at least 24 hours. °· Do not do these things in the first 24 hours: °· Drive. °· Use machinery. °· Take care of other people. °· Sign legal documents. °· Take a bath or shower. °· There are many different ways to close and cover a cut (incision). For example, a cut can be closed with stitches, skin glue, or adhesive strips. Follow your doctor's instructions on: °· Taking care of your cut. °· Changing and removing your bandage (dressing). °· Removing whatever was used to close your cut. °· Do not drink alcohol in the first week. °· Do not lift more than   5 pounds or play contact sports for the first 2 weeks. °· Take medicines only as told by your doctor. For 1 week, do not take medicine that has aspirin in it or medicines like ibuprofen. °· Get your test results. °GET HELP IF: °· A cut bleeds and leaves more than just a small spot of blood. °· A cut is red, puffs up (swells), or hurts more than before. °· Fluid or something else comes from a cut. °· A cut smells  bad. °· You have a fever or chills. °GET HELP RIGHT AWAY IF: °· You have swelling, bloating, or pain in your belly (abdomen). °· You get dizzy or faint. °· You have a rash. °· You feel sick to your stomach (nauseous) or throw up (vomit). °· You have trouble breathing, feel short of breath, or feel faint. °· Your chest hurts. °· You have problems talking or seeing. °· You have trouble balancing or moving your arms or legs. °Document Released: 03/05/2008 Document Revised: 10/11/2013 Document Reviewed: 07/23/2013 °Exit[employer]® Patient Information ©2015 [employer]. This information is not intended to replace advice given to you by your health care provider. Make sure you discuss any questions you have with your health care provider. ° °

## 2014-01-13 NOTE — Procedures (Signed)
R lobe liver lesion Bx No comp 18 g core

## 2014-01-13 NOTE — H&P (Signed)
Anna Benjamin is an 78 y.o. female.   Chief Complaint:"I'm having a liver biopsy" HPI: Patient with history of metastatic breast cancer and recent PET scan revealing progression of hepatic/osseous disease presents today for US guided liver lesion biopsy for ER/PR and Her2 neu status.   Past Medical History  Diagnosis Date  . Allergy   . Hyperlipidemia   . Hypertension   . Osteoporosis   . Breast cancer     Past Surgical History  Procedure Laterality Date  . Abdominal hysterectomy  1986  . Tonsillectomy    . Hip surgery Left     pin and plate   . Anterior and posterior repair      Family History  Problem Relation Age of Onset  . Coronary artery disease    . Cancer Mother     ? etiology  . Cancer Brother     ? etiology  . Arthritis    . Depression     Social History:  reports that she has never smoked. She has quit using smokeless tobacco. She reports that she does not drink alcohol or use illicit drugs.  Allergies: No Known Allergies  Current outpatient prescriptions:aspirin 81 MG chewable tablet, Chew 81 mg by mouth daily.  , Disp: , Rfl: ;  Calcium Carbonate-Vit D-Min (CALCIUM 1200 PO), Take by mouth every morning., Disp: , Rfl: ;  cholecalciferol (VITAMIN D) 1000 UNITS tablet, Take 1,000 Units by mouth daily.  , Disp: , Rfl: ;  Dextromethorphan-Guaifenesin (MUCINEX DM MAXIMUM STRENGTH) 60-1200 MG TB12, Take by mouth 2 (two) times daily., Disp: , Rfl:  docusate sodium (COLACE) 100 MG capsule, Take 300 mg by mouth daily., Disp: , Rfl: ;  fexofenadine (ALLEGRA) 180 MG tablet, Take 180 mg by mouth daily.  , Disp: , Rfl: ;  fluticasone (FLONASE) 50 MCG/ACT nasal spray, Place 2 sprays into the nose as needed., Disp: , Rfl: ;  HYDROcodone-acetaminophen (NORCO) 10-325 MG per tablet, Take 1-2 tablets by mouth every 6 (six) hours as needed for moderate pain., Disp: , Rfl:  lisinopril-hydrochlorothiazide (PRINZIDE,ZESTORETIC) 10-12.5 MG per tablet, Take 1 tablet by mouth every  morning., Disp: , Rfl: ;  LORazepam (ATIVAN) 0.5 MG tablet, Take 0.5 mg by mouth every 6 (six) hours as needed for anxiety., Disp: , Rfl: ;  megestrol (MEGACE) 40 MG tablet, Take 20 mg by mouth daily., Disp: , Rfl: ;  omeprazole (PRILOSEC) 20 MG capsule, Take 20 mg by mouth daily., Disp: , Rfl:  Pyridoxine HCl (VITAMIN B-6) 250 MG tablet, Take 250 mg by mouth daily., Disp: , Rfl: ;  traZODone (DESYREL) 50 MG tablet, Take 100 mg by mouth at bedtime as needed for sleep., Disp: , Rfl:  Current facility-administered medications:0.9 %  sodium chloride infusion, , Intravenous, Continuous, D Rowe Robert, PA-C, Last Rate: 50 mL/hr at 01/13/14 1121   Results for orders placed during the hospital encounter of 01/13/14 (from the past 48 hour(s))  APTT     Status: Abnormal   Collection Time    01/13/14 11:20 AM      Result Value Ref Range   aPTT 21 (*) 24 - 37 seconds  CBC WITH DIFFERENTIAL     Status: Abnormal   Collection Time    01/13/14 11:20 AM      Result Value Ref Range   WBC 8.8  4.0 - 10.5 K/uL   RBC 3.55 (*) 3.87 - 5.11 MIL/uL   Hemoglobin 12.2  12.0 - 15.0 g/dL   HCT 36.7  36.0 -  46.0 %   MCV 103.4 (*) 78.0 - 100.0 fL   MCH 34.4 (*) 26.0 - 34.0 pg   MCHC 33.2  30.0 - 36.0 g/dL   RDW 14.2  11.5 - 15.5 %   Platelets 176  150 - 400 K/uL   Neutrophils Relative % 70  43 - 77 %   Neutro Abs 6.2  1.7 - 7.7 K/uL   Lymphocytes Relative 20  12 - 46 %   Lymphs Abs 1.7  0.7 - 4.0 K/uL   Monocytes Relative 10  3 - 12 %   Monocytes Absolute 0.8  0.1 - 1.0 K/uL   Eosinophils Relative 0  0 - 5 %   Eosinophils Absolute 0.0  0.0 - 0.7 K/uL   Basophils Relative 0  0 - 1 %   Basophils Absolute 0.0  0.0 - 0.1 K/uL  PROTIME-INR     Status: None   Collection Time    01/13/14 11:20 AM      Result Value Ref Range   Prothrombin Time 12.9  11.6 - 15.2 seconds   INR 0.97  0.00 - 1.49   No results found.  Review of Systems  Constitutional: Negative for fever and chills.  Respiratory: Negative for  hemoptysis and shortness of breath.        Occ cough  Cardiovascular: Negative for chest pain.  Gastrointestinal: Negative for nausea, vomiting, abdominal pain and blood in stool.  Genitourinary: Negative for hematuria.  Musculoskeletal:       Occ back , shoulder pain  Neurological: Negative for headaches.    Blood pressure 147/82, pulse 104, temperature 97.8 F (36.6 C), temperature source Oral, resp. rate 16, SpO2 99.00%. Physical Exam  Constitutional: She is oriented to person, place, and time. She appears well-developed and well-nourished.  Cardiovascular: Regular rhythm.   Sl tachycardic  Respiratory: Effort normal and breath sounds normal.  GI: Soft. Bowel sounds are normal. There is no tenderness.  Musculoskeletal: Normal range of motion.  Neurological: She is alert and oriented to person, place, and time.     Assessment/Plan Patient with history of metastatic breast cancer and recent PET scan revealing progression of hepatic/osseous disease presents today for US guided liver lesion biopsy for ER/PR and Her2 neu status. Details/risks of procedure d/w pt with her understanding and consent.  Janes Colegrove,D KEVIN 01/13/2014, 12:42 PM

## 2014-01-20 ENCOUNTER — Other Ambulatory Visit: Payer: Self-pay | Admitting: Hematology & Oncology

## 2014-01-20 DIAGNOSIS — C50919 Malignant neoplasm of unspecified site of unspecified female breast: Secondary | ICD-10-CM

## 2014-01-24 ENCOUNTER — Encounter: Payer: Self-pay | Admitting: Hematology & Oncology

## 2014-01-24 ENCOUNTER — Ambulatory Visit: Payer: Medicare Other | Admitting: Hematology & Oncology

## 2014-01-24 ENCOUNTER — Other Ambulatory Visit: Payer: Medicare Other | Admitting: Lab

## 2014-01-24 ENCOUNTER — Other Ambulatory Visit: Payer: Self-pay

## 2014-01-24 ENCOUNTER — Other Ambulatory Visit (HOSPITAL_BASED_OUTPATIENT_CLINIC_OR_DEPARTMENT_OTHER): Payer: Medicare Other | Admitting: Lab

## 2014-01-24 ENCOUNTER — Ambulatory Visit (HOSPITAL_BASED_OUTPATIENT_CLINIC_OR_DEPARTMENT_OTHER): Payer: Medicare Other | Admitting: Hematology & Oncology

## 2014-01-24 VITALS — BP 123/79 | HR 96 | Temp 97.6°F | Resp 16 | Ht 60.0 in | Wt 117.0 lb

## 2014-01-24 DIAGNOSIS — C50919 Malignant neoplasm of unspecified site of unspecified female breast: Secondary | ICD-10-CM

## 2014-01-24 DIAGNOSIS — F419 Anxiety disorder, unspecified: Secondary | ICD-10-CM

## 2014-01-24 LAB — CBC WITH DIFFERENTIAL (CANCER CENTER ONLY)
BASO#: 0 10*3/uL (ref 0.0–0.2)
BASO%: 0.3 % (ref 0.0–2.0)
EOS ABS: 0 10*3/uL (ref 0.0–0.5)
EOS%: 0.2 % (ref 0.0–7.0)
HCT: 36.2 % (ref 34.8–46.6)
HGB: 12 g/dL (ref 11.6–15.9)
LYMPH#: 2.4 10*3/uL (ref 0.9–3.3)
LYMPH%: 21.7 % (ref 14.0–48.0)
MCH: 34.4 pg — AB (ref 26.0–34.0)
MCHC: 33.1 g/dL (ref 32.0–36.0)
MCV: 104 fL — ABNORMAL HIGH (ref 81–101)
MONO#: 1.3 10*3/uL — ABNORMAL HIGH (ref 0.1–0.9)
MONO%: 11.7 % (ref 0.0–13.0)
NEUT%: 66.1 % (ref 39.6–80.0)
NEUTROS ABS: 7.4 10*3/uL — AB (ref 1.5–6.5)
PLATELETS: 221 10*3/uL (ref 145–400)
RBC: 3.49 10*6/uL — AB (ref 3.70–5.32)
RDW: 13.5 % (ref 11.1–15.7)
WBC: 11.2 10*3/uL — ABNORMAL HIGH (ref 3.9–10.0)

## 2014-01-24 LAB — COMPREHENSIVE METABOLIC PANEL
ALK PHOS: 378 U/L — AB (ref 39–117)
ALT: 27 U/L (ref 0–35)
AST: 72 U/L — AB (ref 0–37)
Albumin: 4 g/dL (ref 3.5–5.2)
BUN: 21 mg/dL (ref 6–23)
CO2: 22 mEq/L (ref 19–32)
Calcium: 9.7 mg/dL (ref 8.4–10.5)
Chloride: 103 mEq/L (ref 96–112)
Creatinine, Ser: 0.83 mg/dL (ref 0.50–1.10)
GLUCOSE: 144 mg/dL — AB (ref 70–99)
Potassium: 3.7 mEq/L (ref 3.5–5.3)
SODIUM: 137 meq/L (ref 135–145)
TOTAL PROTEIN: 6.5 g/dL (ref 6.0–8.3)
Total Bilirubin: 1.1 mg/dL (ref 0.2–1.2)

## 2014-01-24 LAB — CANCER ANTIGEN 27.29: CA 27.29: 3728 U/mL — ABNORMAL HIGH (ref 0–39)

## 2014-01-24 LAB — LACTATE DEHYDROGENASE: LDH: 359 U/L — ABNORMAL HIGH (ref 94–250)

## 2014-01-24 MED ORDER — PALBOCICLIB 100 MG PO CAPS: 100.0000 mg | ORAL_CAPSULE | Freq: Every day | ORAL | Status: DC

## 2014-01-24 NOTE — Progress Notes (Signed)
Hematology and Oncology Follow Up Visit  Mariachristina Holle [id number] 1935/09/25 78 y.o. 01/24/2014   Principle Diagnosis:  Metastatic breast cancer-   ER positive/HER2 (-)  Current Therapy:    Patient to start Fareston with Ibrance  Zometa 4 mg IV q. 2 months     Interim History:  Ms.  Strauser is back for followup. We have documented progressive disease. She had a PET scan done which showed progression in her liver. Shows some progression with her bone disease. Her CA 27.29 has gone up quite a bit. Today, it was up to 3730.  She did undergo a liver biopsy. This was done on August 6. The pathology report ([id number]) showed that she did have metastatic breast cancer. It was still ER positive and HER-2 negative.  She has not had endocrine therapy for quite a while. As such, I think it would be reasonable to go back and try endocrine therapy on her. I am not sure how much it she did tolerate with respect to chemotherapy.  The [facility] would be a good choice for her. I also think adding Ibrance to enhance the Fareston effect would be worthwhile. I think she could tolerate both.  I talked to her for about 45 minutes. I explained to her what was going on. She is very well aware of the problem with her breast cancer. We have been seeing her for about 4 years. She done incredibly well.  Her appetite is down a little bit. She's having a little bit more pain, mostly in the right shoulder. I think this might be a rotator cuff issue. She's had no problems bowels or bladder. She's had no headache. She's had no leg swelling.  Overall, her performance status is ECOG 1.   Medications: Current outpatient prescriptions:aspirin 81 MG chewable tablet, Chew 81 mg by mouth daily.  , Disp: , Rfl: ;  Calcium Carbonate-Vit D-Min (CALCIUM 1200 PO), Take by mouth every morning., Disp: , Rfl: ;  cholecalciferol (VITAMIN D) 1000 UNITS tablet, Take 1,000 Units by mouth daily.  , Disp: , Rfl: ;   Dextromethorphan-Guaifenesin (MUCINEX DM MAXIMUM STRENGTH) 60-1200 MG TB12, Take by mouth 2 (two) times daily., Disp: , Rfl:  docusate sodium (COLACE) 100 MG capsule, Take 300 mg by mouth daily., Disp: , Rfl: ;  fexofenadine (ALLEGRA) 180 MG tablet, Take 180 mg by mouth daily.  , Disp: , Rfl: ;  fluticasone (FLONASE) 50 MCG/ACT nasal spray, Place 2 sprays into the nose as needed., Disp: , Rfl: ;  HYDROcodone-acetaminophen (NORCO) 10-325 MG per tablet, Take 1-2 tablets by mouth every 6 (six) hours as needed for moderate pain., Disp: , Rfl:  lisinopril-hydrochlorothiazide (PRINZIDE,ZESTORETIC) 10-12.5 MG per tablet, Take 1 tablet by mouth every morning., Disp: , Rfl: ;  LORazepam (ATIVAN) 0.5 MG tablet, Take 0.5 mg by mouth every 6 (six) hours as needed for anxiety., Disp: , Rfl: ;  megestrol (MEGACE) 40 MG tablet, Take 20 mg by mouth daily., Disp: , Rfl: ;  omeprazole (PRILOSEC) 20 MG capsule, Take 20 mg by mouth daily., Disp: , Rfl:  Pyridoxine HCl (VITAMIN B-6) 250 MG tablet, Take 250 mg by mouth daily., Disp: , Rfl: ;  traZODone (DESYREL) 50 MG tablet, Take 100 mg by mouth at bedtime as needed for sleep., Disp: , Rfl: ;  palbociclib (IBRANCE) 100 MG capsule, Take 1 capsule (100 mg total) by mouth daily with breakfast. Take whole with food., Disp: 30 capsule, Rfl: 3  Allergies: No Known Allergies  Past Medical History,  Surgical history, Social history, and Family History were reviewed and updated.  Review of Systems: As above  Physical Exam:  height is 5' (1.524 m) and weight is 117 lb (53.071 kg). Her oral temperature is 97.6 F (36.4 C). Her blood pressure is 123/79 and her pulse is 96. Her respiration is 16.   Lungs are clear. Oral exam is without mucositis. Neck is supple with no adenopathy. Ocular exam shows no sclera icterus. Cardiac exam regular in rhythm. Abdomen is soft. She has good bowel sounds. No fluid wave. There is no palpable liver or spleen tip. Back exam no tenderness over the  spine. Chest slight kyphosis. Extremities shows no clubbing cyanosis or edema. She's good range most of her joints. Skin exam does show erythema on the palms of the hands and soles of the feet. Some cracking of the skin is noted. Neurological exam is nonfocal.  Lab Results  Component Value Date   WBC 11.2* 01/24/2014   HGB 12.0 01/24/2014   HCT 36.2 01/24/2014   MCV 104* 01/24/2014   PLT 221 01/24/2014     Chemistry      Component Value Date/Time   NA 137 01/24/2014 1152   NA 148* 12/23/2013 1320   K 3.7 01/24/2014 1152   K 3.8 12/23/2013 1320   CL 103 01/24/2014 1152   CL 102 12/23/2013 1320   CO2 22 01/24/2014 1152   CO2 27 12/23/2013 1320   BUN 21 01/24/2014 1152   BUN 23* 12/23/2013 1320   CREATININE 0.83 01/24/2014 1152   CREATININE 0.9 12/23/2013 1320      Component Value Date/Time   CALCIUM 9.7 01/24/2014 1152   CALCIUM 9.1 12/23/2013 1320   ALKPHOS 378* 01/24/2014 1152   ALKPHOS 144* 12/23/2013 1320   AST 72* 01/24/2014 1152   AST 55* 12/23/2013 1320   ALT 27 01/24/2014 1152   ALT 29 12/23/2013 1320   BILITOT 1.1 01/24/2014 1152   BILITOT 1.20 12/23/2013 1320         Impression and Plan: Ms. Galindez is 78 year old female with metastatic breast cancer. The breast cancer is progressing.  She's been through about 4 or 5 different lines of chemotherapy. As such, we really need to consider a quality of life. She wants quality of life. We talked about this. She just wants to go to function. She is a son that lives with her. She wants to try to help him.  I talked about the fact that this next course of therapy is not chemotherapy but anti-estrogen therapy. I explained to her that the chance of response will be probably 30% or so. She knows that she is not curable. Again, she wants to have a decent quality of life.  We will have to try to get the [facility] for her. She is limited financially. Thereby give her a months worth of Fareston.  We will plan to get her back in 3  weeks.  Volanda Napoleon, MD 8/17/20155:09 PM

## 2014-01-26 ENCOUNTER — Encounter: Payer: Self-pay | Admitting: Pharmacist

## 2014-01-26 NOTE — Progress Notes (Signed)
Anna Benjamin was given Fareston samples earlier this month (date unknown).  Fareston 60mg  #28 tabs   Lot:  [id number]    Exp:  9/15 These will expire at the end of the month.  Additional samples obtained today to give to patient when she comes in.

## 2014-01-28 ENCOUNTER — Telehealth: Payer: Self-pay | Admitting: Hematology & Oncology

## 2014-01-28 NOTE — Telephone Encounter (Signed)
[employer] approved IBRANCE 100 MG  Auth: [id number]     Dates: 01/28/2014 - 01/29/2015     P: [phone]    COPY SCANNED

## 2014-02-01 ENCOUNTER — Telehealth: Payer: Self-pay | Admitting: Hematology & Oncology

## 2014-02-01 NOTE — Telephone Encounter (Signed)
[employer] [facility] approved IBRANCE.   Auth: [id number]  Dates: 01/31/2014 - 02/01/2015      P: [phone]     COPY SCANNED

## 2014-02-07 ENCOUNTER — Telehealth: Payer: Self-pay | Admitting: Hematology & Oncology

## 2014-02-07 NOTE — Telephone Encounter (Signed)
[employer] [facility] approved IBRANCE.  Auth: [id number]  Dates: 01/26/2014 - 02/01/2015  P: [phone]      COPY SCANNED

## 2014-02-20 ENCOUNTER — Other Ambulatory Visit: Payer: Self-pay | Admitting: Hematology & Oncology

## 2014-02-24 ENCOUNTER — Other Ambulatory Visit (HOSPITAL_BASED_OUTPATIENT_CLINIC_OR_DEPARTMENT_OTHER): Payer: Medicare Other | Admitting: Lab

## 2014-02-24 ENCOUNTER — Ambulatory Visit (HOSPITAL_BASED_OUTPATIENT_CLINIC_OR_DEPARTMENT_OTHER): Payer: Medicare Other | Admitting: Hematology & Oncology

## 2014-02-24 ENCOUNTER — Encounter: Payer: Self-pay | Admitting: Pharmacist

## 2014-02-24 ENCOUNTER — Ambulatory Visit (HOSPITAL_BASED_OUTPATIENT_CLINIC_OR_DEPARTMENT_OTHER): Payer: Medicare Other

## 2014-02-24 ENCOUNTER — Encounter: Payer: Self-pay | Admitting: Hematology & Oncology

## 2014-02-24 VITALS — BP 133/65 | HR 77 | Temp 97.5°F | Resp 14 | Ht 60.0 in | Wt 116.0 lb

## 2014-02-24 DIAGNOSIS — M810 Age-related osteoporosis without current pathological fracture: Secondary | ICD-10-CM

## 2014-02-24 DIAGNOSIS — C50919 Malignant neoplasm of unspecified site of unspecified female breast: Secondary | ICD-10-CM

## 2014-02-24 DIAGNOSIS — Z23 Encounter for immunization: Secondary | ICD-10-CM

## 2014-02-24 LAB — CBC WITH DIFFERENTIAL (CANCER CENTER ONLY)
BASO#: 0 10*3/uL (ref 0.0–0.2)
BASO%: 0.3 % (ref 0.0–2.0)
EOS%: 0 % (ref 0.0–7.0)
Eosinophils Absolute: 0 10*3/uL (ref 0.0–0.5)
HEMATOCRIT: 34.1 % — AB (ref 34.8–46.6)
HEMOGLOBIN: 11.2 g/dL — AB (ref 11.6–15.9)
LYMPH#: 1.3 10*3/uL (ref 0.9–3.3)
LYMPH%: 32.8 % (ref 14.0–48.0)
MCH: 33.8 pg (ref 26.0–34.0)
MCHC: 32.8 g/dL (ref 32.0–36.0)
MCV: 103 fL — ABNORMAL HIGH (ref 81–101)
MONO#: 0.2 10*3/uL (ref 0.1–0.9)
MONO%: 5.4 % (ref 0.0–13.0)
NEUT#: 2.4 10*3/uL (ref 1.5–6.5)
NEUT%: 61.5 % (ref 39.6–80.0)
Platelets: 99 10*3/uL — ABNORMAL LOW (ref 145–400)
RBC: 3.31 10*6/uL — ABNORMAL LOW (ref 3.70–5.32)
RDW: 14.8 % (ref 11.1–15.7)
WBC: 3.9 10*3/uL (ref 3.9–10.0)

## 2014-02-24 LAB — CMP (CANCER CENTER ONLY)
ALT: 22 U/L (ref 10–47)
AST: 55 U/L — AB (ref 11–38)
Albumin: 3.6 g/dL (ref 3.3–5.5)
Alkaline Phosphatase: 228 U/L — ABNORMAL HIGH (ref 26–84)
BILIRUBIN TOTAL: 0.8 mg/dL (ref 0.20–1.60)
BUN: 24 mg/dL — AB (ref 7–22)
CO2: 19 mEq/L (ref 18–33)
CREATININE: 0.8 mg/dL (ref 0.6–1.2)
Calcium: 9.2 mg/dL (ref 8.0–10.3)
Chloride: 105 mEq/L (ref 98–108)
Glucose, Bld: 129 mg/dL — ABNORMAL HIGH (ref 73–118)
Potassium: 3.5 mEq/L (ref 3.3–4.7)
Sodium: 139 mEq/L (ref 128–145)
Total Protein: 6.5 g/dL (ref 6.4–8.1)

## 2014-02-24 MED ORDER — LORAZEPAM 0.5 MG PO TABS: 0.5000 mg | ORAL_TABLET | Freq: Four times a day (QID) | ORAL | Status: DC | PRN

## 2014-02-24 MED ORDER — ZOLEDRONIC ACID 4 MG/100ML IV SOLN
4.0000 mg | Freq: Once | INTRAVENOUS | Status: AC
  Administered 2014-02-24: 4 mg via INTRAVENOUS
  Filled 2014-02-24: qty 100

## 2014-02-24 MED ORDER — INFLUENZA VAC SPLIT QUAD 0.5 ML IM SUSY
0.5000 mL | PREFILLED_SYRINGE | Freq: Once | INTRAMUSCULAR | Status: AC
  Administered 2014-02-24: 0.5 mL via INTRAMUSCULAR
  Filled 2014-02-24: qty 0.5

## 2014-02-24 MED ORDER — INFLUENZA VAC SPLIT QUAD 0.5 ML IM SUSY: 0.5000 mL | PREFILLED_SYRINGE | INTRAMUSCULAR | Status: DC

## 2014-02-24 NOTE — Progress Notes (Signed)
Patient given Fareston 60 mg samples.  2 boxes x 7 tabs each = 14 tabs total.   Lot:  [id number]    Exp:  8/16

## 2014-02-24 NOTE — Patient Instructions (Signed)
Influenza Virus Vaccine (Flucelvax) What is this medicine? INFLUENZA VIRUS VACCINE (in floo EN zuh VAHY ruhs vak SEEN) helps to reduce the risk of getting influenza also known as the flu. The vaccine only helps protect you against some strains of the flu. This medicine may be used for other purposes; ask your health care provider or pharmacist if you have questions. COMMON BRAND NAME(S): FLUCELVAX What should I tell my health care provider before I take this medicine? They need to know if you have any of these conditions: -bleeding disorder like hemophilia -fever or infection -Guillain-Barre syndrome or other neurological problems -immune system problems -infection with the human immunodeficiency virus (HIV) or AIDS -low blood platelet counts -multiple sclerosis -an unusual or allergic reaction to influenza virus vaccine, other medicines, foods, dyes or preservatives -pregnant or trying to get pregnant -breast-feeding How should I use this medicine? This vaccine is for injection into a muscle. It is given by a health care professional. A copy of Vaccine Information Statements will be given before each vaccination. Read this sheet carefully each time. The sheet may change frequently. Talk to your pediatrician regarding the use of this medicine in children. Special care may be needed. Overdosage: If you think you've taken too much of this medicine contact a poison control center or emergency room at once. Overdosage: If you think you have taken too much of this medicine contact a poison control center or emergency room at once. NOTE: This medicine is only for you. Do not share this medicine with others. What if I miss a dose? This does not apply. What may interact with this medicine? -chemotherapy or radiation therapy -medicines that lower your immune system like etanercept, anakinra, infliximab, and adalimumab -medicines that treat or prevent blood clots like  warfarin -phenytoin -steroid medicines like prednisone or cortisone -theophylline -vaccines This list may not describe all possible interactions. Give your health care provider a list of all the medicines, herbs, non-prescription drugs, or dietary supplements you use. Also tell them if you smoke, drink alcohol, or use illegal drugs. Some items may interact with your medicine. What should I watch for while using this medicine? Report any side effects that do not go away within 3 days to your doctor or health care professional. Call your health care provider if any unusual symptoms occur within 6 weeks of receiving this vaccine. You may still catch the flu, but the illness is not usually as bad. You cannot get the flu from the vaccine. The vaccine will not protect against colds or other illnesses that may cause fever. The vaccine is needed every year. What side effects may I notice from receiving this medicine? Side effects that you should report to your doctor or health care professional as soon as possible: -allergic reactions like skin rash, itching or hives, swelling of the face, lips, or tongue Side effects that usually do not require medical attention (Report these to your doctor or health care professional if they continue or are bothersome.): -fever -headache -muscle aches and pains -pain, tenderness, redness, or swelling at the injection site -tiredness This list may not describe all possible side effects. Call your doctor for medical advice about side effects. You may report side effects to FDA at 1-800-FDA-1088. Where should I keep my medicine? The vaccine will be given by a health care professional in a clinic, pharmacy, doctor's office, or other health care setting. You will not be given vaccine doses to store at home. NOTE: This sheet is a summary.   It may not cover all possible information. If you have questions about this medicine, talk to your doctor, pharmacist, or health care  provider.  2015, Elsevier/Gold Standard. (2011-05-08 14:06:47) Zoledronic Acid injection (Hypercalcemia, Oncology) What is this medicine? ZOLEDRONIC ACID (ZOE le dron ik AS id) lowers the amount of calcium loss from bone. It is used to treat too much calcium in your blood from cancer. It is also used to prevent complications of cancer that has spread to the bone. This medicine may be used for other purposes; ask your health care provider or pharmacist if you have questions. COMMON BRAND NAME(S): Zometa What should I tell my health care provider before I take this medicine? They need to know if you have any of these conditions: -aspirin-sensitive asthma -cancer, especially if you are receiving medicines used to treat cancer -dental disease or wear dentures -infection -kidney disease -receiving corticosteroids like dexamethasone or prednisone -an unusual or allergic reaction to zoledronic acid, other medicines, foods, dyes, or preservatives -pregnant or trying to get pregnant -breast-feeding How should I use this medicine? This medicine is for infusion into a vein. It is given by a health care professional in a hospital or clinic setting. Talk to your pediatrician regarding the use of this medicine in children. Special care may be needed. Overdosage: If you think you have taken too much of this medicine contact a poison control center or emergency room at once. NOTE: This medicine is only for you. Do not share this medicine with others. What if I miss a dose? It is important not to miss your dose. Call your doctor or health care professional if you are unable to keep an appointment. What may interact with this medicine? -certain antibiotics given by injection -NSAIDs, medicines for pain and inflammation, like ibuprofen or naproxen -some diuretics like bumetanide, furosemide -teriparatide -thalidomide This list may not describe all possible interactions. Give your health care provider a  list of all the medicines, herbs, non-prescription drugs, or dietary supplements you use. Also tell them if you smoke, drink alcohol, or use illegal drugs. Some items may interact with your medicine. What should I watch for while using this medicine? Visit your doctor or health care professional for regular checkups. It may be some time before you see the benefit from this medicine. Do not stop taking your medicine unless your doctor tells you to. Your doctor may order blood tests or other tests to see how you are doing. Women should inform their doctor if they wish to become pregnant or think they might be pregnant. There is a potential for serious side effects to an unborn child. Talk to your health care professional or pharmacist for more information. You should make sure that you get enough calcium and vitamin D while you are taking this medicine. Discuss the foods you eat and the vitamins you take with your health care professional. Some people who take this medicine have severe bone, joint, and/or muscle pain. This medicine may also increase your risk for jaw problems or a broken thigh bone. Tell your doctor right away if you have severe pain in your jaw, bones, joints, or muscles. Tell your doctor if you have any pain that does not go away or that gets worse. Tell your dentist and dental surgeon that you are taking this medicine. You should not have major dental surgery while on this medicine. See your dentist to have a dental exam and fix any dental problems before starting this medicine. Take good care  of your teeth while on this medicine. Make sure you see your dentist for regular follow-up appointments. What side effects may I notice from receiving this medicine? Side effects that you should report to your doctor or health care professional as soon as possible: -allergic reactions like skin rash, itching or hives, swelling of the face, lips, or tongue -anxiety, confusion, or  depression -breathing problems -changes in vision -eye pain -feeling faint or lightheaded, falls -jaw pain, especially after dental work -mouth sores -muscle cramps, stiffness, or weakness -trouble passing urine or change in the amount of urine Side effects that usually do not require medical attention (report to your doctor or health care professional if they continue or are bothersome): -bone, joint, or muscle pain -constipation -diarrhea -fever -hair loss -irritation at site where injected -loss of appetite -nausea, vomiting -stomach upset -trouble sleeping -trouble swallowing -weak or tired This list may not describe all possible side effects. Call your doctor for medical advice about side effects. You may report side effects to FDA at 1-800-FDA-1088. Where should I keep my medicine? This drug is given in a hospital or clinic and will not be stored at home. NOTE: This sheet is a summary. It may not cover all possible information. If you have questions about this medicine, talk to your doctor, pharmacist, or health care provider.  2015, Elsevier/Gold Standard. (2012-11-05 13:03:13)

## 2014-02-25 NOTE — Progress Notes (Signed)
Hematology and Oncology Follow Up Visit  Anna Benjamin [id number] 07-24-1935 78 y.o. 02/25/2014   Principle Diagnosis:   Metastatic breast cancer-ER positive/HER-2 negative    Current Therapy:    Fareston/Ibrance-status post 1 cycle  Zometa 4 mg IV q. 2 months     Interim History:  [name] is back for followup. She now is on hormonal therapy with the [place] and Ibrance combination. Again, the [place]lee [place] is 0.1 his on and 7 days off.  She's had no problems with hormonal therapy. She actually feels pretty good. She does get tired on occasion.  She's not having and orthopedic problems. When she was on Xeloda, she does have a hand-and-foot type problems.  Route has been okay. She's had no nausea or vomiting. There's been no diarrhea. She's had no rashes. There's been no leg swelling. She's had no fever. She's had no mouth sores. She's had no headache.  Her last CA 27.29 was 3700. I think it's a little too early to check his. She really has had only one cycle of the hormonal therapy.  Overall, her performance status is ECOG 2.  Medications: Current outpatient prescriptions:aspirin 81 MG chewable tablet, Chew 81 mg by mouth daily.  , Disp: , Rfl: ;  Calcium Carbonate-Vit D-Min (CALCIUM 1200 PO), Take by mouth every morning., Disp: , Rfl: ;  cholecalciferol (VITAMIN D) 1000 UNITS tablet, Take 1,000 Units by mouth daily.  , Disp: , Rfl: ;  Dextromethorphan-Guaifenesin (MUCINEX DM MAXIMUM STRENGTH) 60-1200 MG TB12, Take by mouth 2 (two) times daily., Disp: , Rfl:  docusate sodium (COLACE) 100 MG capsule, Take 300 mg by mouth daily., Disp: , Rfl: ;  fexofenadine (ALLEGRA) 180 MG tablet, Take 180 mg by mouth daily.  , Disp: , Rfl: ;  fluticasone (FLONASE) 50 MCG/ACT nasal spray, Place 2 sprays into the nose as needed., Disp: , Rfl: ;  HYDROcodone-acetaminophen (NORCO) 10-325 MG per tablet, Take 1-2 tablets by mouth every 6 (six) hours as needed for moderate pain., Disp: , Rfl:    lisinopril-hydrochlorothiazide (PRINZIDE,ZESTORETIC) 10-12.5 MG per tablet, Take 1 tablet by mouth every morning., Disp: , Rfl: ;  LORazepam (ATIVAN) 0.5 MG tablet, Take 1 tablet (0.5 mg total) by mouth every 6 (six) hours as needed for anxiety., Disp: 60 tablet, Rfl: 1;  megestrol (MEGACE) 40 MG tablet, Take 20 mg by mouth daily., Disp: , Rfl: ;  omeprazole (PRILOSEC) 20 MG capsule, Take 20 mg by mouth daily., Disp: , Rfl:  palbociclib (IBRANCE) 100 MG capsule, Take 1 capsule (100 mg total) by mouth daily with breakfast. Take whole with food., Disp: 30 capsule, Rfl: 3;  Pyridoxine HCl (VITAMIN B-6) 250 MG tablet, Take 250 mg by mouth daily., Disp: , Rfl: ;  Toremifene Citrate (FARESTON) 60 MG tablet, Take 60 mg by mouth daily. Samples given by Dr. Marin Olp, Disp: , Rfl:  traZODone (DESYREL) 50 MG tablet, TAKE 2 TABLETS BY MOUTH EVERY NIGHT AT BEDTIME AS NEEDED FOR SLEEP, Disp: 60 tablet, Rfl: 0  Allergies: No Known Allergies  Past Medical History, Surgical history, Social history, and Family History were reviewed and updated.  Review of Systems: As above  Physical Exam:  height is 5' (1.524 m) and weight is 116 lb (52.617 kg). Her oral temperature is 97.5 F (36.4 C). Her blood pressure is 133/65 and her pulse is 77. Her respiration is 14.   Petite white female. Head and exam shows no ocular or oral lesions. There is no scleral icterus. She has no adenopathy  on the neck. Lungs are clear. The Xarelto, wheezes or rhonchi. Cardiac exam regular in rhythm with no murmurs, rubs or bruits. Abdomen is soft. She has good bowel sounds. There is no fluid wave. There is no palpable liver or spleen tip. Back exam no tenderness over the spine, ribs or hips. She has some slight kyphosis. This is chronic. Extremities shows no clubbing, cyanosis or edema. His this further extremities. She has good range of motion of her joints. Skin exam shows no erythema in her hands or feet. She has no ecchymoses there is no  petechia. Neurological exam is nonfocal.  Lab Results  Component Value Date   WBC 3.9 02/24/2014   HGB 11.2* 02/24/2014   HCT 34.1* 02/24/2014   MCV 103* 02/24/2014   PLT 99* 02/24/2014     Chemistry      Component Value Date/Time   NA 139 02/24/2014 1146   NA 137 01/24/2014 1152   K 3.5 02/24/2014 1146   K 3.7 01/24/2014 1152   CL 105 02/24/2014 1146   CL 103 01/24/2014 1152   CO2 19 02/24/2014 1146   CO2 22 01/24/2014 1152   BUN 24* 02/24/2014 1146   BUN 21 01/24/2014 1152   CREATININE 0.8 02/24/2014 1146   CREATININE 0.83 01/24/2014 1152      Component Value Date/Time   CALCIUM 9.2 02/24/2014 1146   CALCIUM 9.7 01/24/2014 1152   ALKPHOS 228* 02/24/2014 1146   ALKPHOS 378* 01/24/2014 1152   AST 55* 02/24/2014 1146   AST 72* 01/24/2014 1152   ALT 22 02/24/2014 1146   ALT 27 01/24/2014 1152   BILITOT 0.80 02/24/2014 1146   BILITOT 1.1 01/24/2014 1152         Impression and Plan: Anna Benjamin is 78 year old female with metastatic breast cancer. She has she has had metastatic disease now for about 3 or 4 years. We have had her on chemotherapy. He pretty much has gone through all the lines of chemotherapy that she could tolerate. I felt that since she was still ER positive, that we could try hormonal therapy along with Ibrance.  I have noted that her alkaline phosphatase is up a little more. This could reflect what is in her liver. Could also reflect what is in her bones. I think we have to be patient and just give the anti-estrogen therapy a chance to work.  We will go ahead and plan to get her back in another month.  She will get her Zometa today.   Volanda Napoleon, MD 9/18/20156:59 AM

## 2014-03-02 ENCOUNTER — Telehealth: Payer: Self-pay | Admitting: Hematology & Oncology

## 2014-03-02 NOTE — Telephone Encounter (Signed)
Pt moved 10-27 to 11-2 due to time of day

## 2014-03-11 ENCOUNTER — Other Ambulatory Visit: Payer: Self-pay | Admitting: *Deleted

## 2014-03-11 DIAGNOSIS — C50919 Malignant neoplasm of unspecified site of unspecified female breast: Secondary | ICD-10-CM

## 2014-03-11 MED ORDER — TOREMIFENE CITRATE 60 MG PO TABS: 60.0000 mg | ORAL_TABLET | Freq: Every day | ORAL | Status: DC

## 2014-04-05 ENCOUNTER — Ambulatory Visit: Payer: Medicare Other | Admitting: Hematology & Oncology

## 2014-04-05 ENCOUNTER — Other Ambulatory Visit: Payer: Medicare Other | Admitting: Lab

## 2014-04-11 ENCOUNTER — Other Ambulatory Visit: Payer: Self-pay

## 2014-04-11 ENCOUNTER — Encounter: Payer: Self-pay | Admitting: Hematology & Oncology

## 2014-04-11 ENCOUNTER — Ambulatory Visit (HOSPITAL_BASED_OUTPATIENT_CLINIC_OR_DEPARTMENT_OTHER)
Admission: RE | Admit: 2014-04-11 | Discharge: 2014-04-11 | Disposition: A | Discharge status: Home / Self Care | Payer: Medicare Other | Source: Ambulatory Visit | Attending: Hematology & Oncology | Admitting: Hematology & Oncology

## 2014-04-11 ENCOUNTER — Ambulatory Visit (HOSPITAL_BASED_OUTPATIENT_CLINIC_OR_DEPARTMENT_OTHER): Payer: Medicare Other | Admitting: Hematology & Oncology

## 2014-04-11 ENCOUNTER — Other Ambulatory Visit: Payer: Self-pay | Admitting: Hematology & Oncology

## 2014-04-11 ENCOUNTER — Other Ambulatory Visit (HOSPITAL_BASED_OUTPATIENT_CLINIC_OR_DEPARTMENT_OTHER): Payer: Medicare Other | Admitting: Lab

## 2014-04-11 ENCOUNTER — Ambulatory Visit (HOSPITAL_BASED_OUTPATIENT_CLINIC_OR_DEPARTMENT_OTHER): Payer: Medicare Other

## 2014-04-11 VITALS — BP 150/71 | HR 95 | Temp 98.1°F | Resp 14 | Ht 60.0 in | Wt 116.0 lb

## 2014-04-11 DIAGNOSIS — C7951 Secondary malignant neoplasm of bone: Secondary | ICD-10-CM

## 2014-04-11 DIAGNOSIS — C50911 Malignant neoplasm of unspecified site of right female breast: Secondary | ICD-10-CM

## 2014-04-11 DIAGNOSIS — C50912 Malignant neoplasm of unspecified site of left female breast: Secondary | ICD-10-CM

## 2014-04-11 DIAGNOSIS — R609 Edema, unspecified: Secondary | ICD-10-CM | POA: Insufficient documentation

## 2014-04-11 DIAGNOSIS — C787 Secondary malignant neoplasm of liver and intrahepatic bile duct: Secondary | ICD-10-CM

## 2014-04-11 DIAGNOSIS — C50919 Malignant neoplasm of unspecified site of unspecified female breast: Secondary | ICD-10-CM

## 2014-04-11 DIAGNOSIS — E8809 Other disorders of plasma-protein metabolism, not elsewhere classified: Secondary | ICD-10-CM

## 2014-04-11 DIAGNOSIS — M810 Age-related osteoporosis without current pathological fracture: Secondary | ICD-10-CM

## 2014-04-11 DIAGNOSIS — M7989 Other specified soft tissue disorders: Secondary | ICD-10-CM

## 2014-04-11 LAB — CBC WITH DIFFERENTIAL (CANCER CENTER ONLY)
BASO#: 0 10*3/uL (ref 0.0–0.2)
BASO%: 0.4 % (ref 0.0–2.0)
EOS%: 0.2 % (ref 0.0–7.0)
Eosinophils Absolute: 0 10*3/uL (ref 0.0–0.5)
HEMATOCRIT: 33.1 % — AB (ref 34.8–46.6)
HGB: 10.8 g/dL — ABNORMAL LOW (ref 11.6–15.9)
LYMPH#: 1.5 10*3/uL (ref 0.9–3.3)
LYMPH%: 29.9 % (ref 14.0–48.0)
MCH: 35.4 pg — ABNORMAL HIGH (ref 26.0–34.0)
MCHC: 32.6 g/dL (ref 32.0–36.0)
MCV: 109 fL — ABNORMAL HIGH (ref 81–101)
MONO#: 0.3 10*3/uL (ref 0.1–0.9)
MONO%: 6.5 % (ref 0.0–13.0)
NEUT#: 3.2 10*3/uL (ref 1.5–6.5)
NEUT%: 63 % (ref 39.6–80.0)
Platelets: 177 10*3/uL (ref 145–400)
RBC: 3.05 10*6/uL — ABNORMAL LOW (ref 3.70–5.32)
RDW: 16.5 % — AB (ref 11.1–15.7)
WBC: 5.1 10*3/uL (ref 3.9–10.0)

## 2014-04-11 LAB — CMP (CANCER CENTER ONLY)
ALK PHOS: 303 U/L — AB (ref 26–84)
ALT(SGPT): 25 U/L (ref 10–47)
AST: 87 U/L — ABNORMAL HIGH (ref 11–38)
Albumin: 3.2 g/dL — ABNORMAL LOW (ref 3.3–5.5)
BUN: 21 mg/dL (ref 7–22)
CO2: 25 mEq/L (ref 18–33)
CREATININE: 1 mg/dL (ref 0.6–1.2)
Calcium: 9.2 mg/dL (ref 8.0–10.3)
Chloride: 100 mEq/L (ref 98–108)
Glucose, Bld: 113 mg/dL (ref 73–118)
POTASSIUM: 3.9 meq/L (ref 3.3–4.7)
Sodium: 143 mEq/L (ref 128–145)
Total Bilirubin: 0.9 mg/dl (ref 0.20–1.60)
Total Protein: 6.4 g/dL (ref 6.4–8.1)

## 2014-04-11 MED ORDER — HYDROCODONE-ACETAMINOPHEN 10-325 MG PO TABS: 1.0000 | ORAL_TABLET | Freq: Four times a day (QID) | ORAL | Status: AC | PRN

## 2014-04-11 MED ORDER — ZOLEDRONIC ACID 4 MG/100ML IV SOLN
4.0000 mg | Freq: Once | INTRAVENOUS | Status: AC
  Administered 2014-04-11: 4 mg via INTRAVENOUS
  Filled 2014-04-11: qty 100

## 2014-04-11 NOTE — Patient Instructions (Signed)

## 2014-04-11 NOTE — Progress Notes (Signed)
Hematology and Oncology Follow Up Visit  Anna Benjamin [id number] Feb 16, 1936 78 y.o. 04/11/2014   Principle Diagnosis:   Metastatic breast cancer-ER positive/Anna-2 negative    Current Therapy:    Fareston/Ibrance-status post 2 cycles  Zometa 4 mg IV q. 2 months     Interim History:  [name] is back for followup. She would does not feel all that good. The past few weeks, she does has felt very tired. She does not have a lot of energy.  She feels okay in the morning but then as a day progresses, she just gets to feel more tired.  She's also noted that Anna left arm is swollen. This is been going on for 2 weeks. Is not red. It is a little bit tender in the wrist and the upper inner arm. I'm worried about a blood clot. I did do a Doppler of the arm. The initial report is that it is negative for any thrombus.  .he will be interesting to see what Anna CA 27.29 is. Back in August it was 3728.  I worry that Anna cancer is progressing. If it is, then we'll go to have to get Anna back on to chemotherapy. I probably would use  erubilin.  Anna Benjamin was just in the hospital with chest pain. He had a cardiac cath. This looked okay. Overall, Anna performance status is ECOG 2.  Medications: Current outpatient prescriptions: aspirin 81 MG chewable tablet, Chew 81 mg by mouth daily.  , Disp: , Rfl: ;  Calcium Carbonate-Vit D-Min (CALCIUM 1200 PO), Take by mouth every morning., Disp: , Rfl: ;  cholecalciferol (VITAMIN D) 1000 UNITS tablet, Take 1,000 Units by mouth daily.  , Disp: , Rfl: ;  Dextromethorphan-Guaifenesin (MUCINEX DM MAXIMUM STRENGTH) 60-1200 MG TB12, Take by mouth 2 (two) times daily., Disp: , Rfl:  docusate sodium (COLACE) 100 MG capsule, Take 300 mg by mouth daily., Disp: , Rfl: ;  fexofenadine (ALLEGRA) 180 MG tablet, Take 180 mg by mouth daily.  , Disp: , Rfl: ;  fluticasone (FLONASE) 50 MCG/ACT nasal spray, Place 2 sprays into the nose as needed., Disp: , Rfl: ;   HYDROcodone-acetaminophen (NORCO) 10-325 MG per tablet, Take 1-2 tablets by mouth every 6 (six) hours as needed for moderate pain., Disp: 120 tablet, Rfl: 0 lisinopril-hydrochlorothiazide (PRINZIDE,ZESTORETIC) 10-12.5 MG per tablet, Take 1 tablet by mouth every morning., Disp: , Rfl: ;  LORazepam (ATIVAN) 0.5 MG tablet, Take 1 tablet (0.5 mg total) by mouth every 6 (six) hours as needed for anxiety., Disp: 60 tablet, Rfl: 1;  megestrol (MEGACE) 40 MG tablet, Take 20 mg by mouth daily., Disp: , Rfl: ;  omeprazole (PRILOSEC) 20 MG capsule, Take 20 mg by mouth daily., Disp: , Rfl:  palbociclib (IBRANCE) 100 MG capsule, Take 1 capsule (100 mg total) by mouth daily with breakfast. Take whole with food., Disp: 30 capsule, Rfl: 3;  Pyridoxine HCl (VITAMIN B-6) 250 MG tablet, Take 250 mg by mouth daily., Disp: , Rfl: ;  Toremifene Citrate (FARESTON) 60 MG tablet, Take 1 tablet (60 mg total) by mouth daily. Samples given by Dr. Marin Olp, Disp: 30 tablet, Rfl: 6 traZODone (DESYREL) 50 MG tablet, TAKE 2 TABLETS BY MOUTH EVERY NIGHT AT BEDTIME AS NEEDED FOR SLEEP, Disp: 60 tablet, Rfl: 0  Allergies: No Known Allergies  Past Medical History, Surgical history, Social history, and Family History were reviewed and updated.  Review of Systems: As above  Physical Exam:  height is 5' (1.524 m) and weight  is 116 lb (52.617 kg). Anna oral temperature is 98.1 F (36.7 C). Anna blood pressure is 150/71 and Anna pulse is 95. Anna respiration is 14.   Petite white female. Head and exam shows no ocular or oral lesions. There is no scleral icterus. She has no adenopathy on the neck. Lungs are clear. The Xarelto, wheezes or rhonchi. Cardiac exam regular in rhythm with no murmurs, rubs or bruits. Abdomen is soft. She has good bowel sounds. There is no fluid wave. There is no palpable liver or spleen tip. Back exam no tenderness over the spine, ribs or hips. She has some slight kyphosis. This is chronic. Extremities shows some pitting  edema of the left arm. This mostly is in the forearm. There is some edema in the upper arm. There is no erythema.. I cannot palpate any lymph nodes in the left axilla.. She has good range of motion of Anna joints.there is no edema in the lower legs.Skin exam shows no erythema in Anna hands or feet. She has no ecchymoses there is no petechia. Neurological exam is nonfocal.  Lab Results  Component Value Date   WBC 5.1 04/11/2014   HGB 10.8* 04/11/2014   HCT 33.1* 04/11/2014   MCV 109* 04/11/2014   PLT 177 04/11/2014     Chemistry      Component Value Date/Time   NA 143 04/11/2014 1335   NA 137 01/24/2014 1152   K 3.9 04/11/2014 1335   K 3.7 01/24/2014 1152   CL 100 04/11/2014 1335   CL 103 01/24/2014 1152   CO2 25 04/11/2014 1335   CO2 22 01/24/2014 1152   BUN 21 04/11/2014 1335   BUN 21 01/24/2014 1152   CREATININE 1.0 04/11/2014 1335   CREATININE 0.83 01/24/2014 1152      Component Value Date/Time   CALCIUM 9.2 04/11/2014 1335   CALCIUM 9.7 01/24/2014 1152   ALKPHOS 303* 04/11/2014 1335   ALKPHOS 378* 01/24/2014 1152   AST 87* 04/11/2014 1335   AST 72* 01/24/2014 1152   ALT 25 04/11/2014 1335   ALT 27 01/24/2014 1152   BILITOT 0.90 04/11/2014 1335   BILITOT 1.1 01/24/2014 1152         Impression and Plan: Anna Benjamin is 78 year old female with metastatic breast cancer. She has she has had metastatic disease now for about 3 or 4 years. We have had Anna on chemotherapy.  She pretty much has gone through all the lines of chemotherapy that she could tolerate. I felt that since she was still ER positive, that we could try hormonal therapy along with Ibrance.  I told Anna that we probably are going to have to try chemotherapy if we find that Anna CA 27.29 is further elevated. Anna alkaline phosphatase is going up. Anna liver enzymes were also increasing.  She really does not like doing radiologic test. She has a lot of financial constraints that she has to work with. As such, I  think that just the CA 27.29 would be reasonable to follow.  If we have to go with chemotherapy, she will need a Port-A-Cath.  I will await the radiologic report to make sure that there is no clot. I spoke she may have something in the chest. Again she really does not like doing scans.  We will go ahead with Zometa today.  I spent about 40 minutes with Anna today with the issues with Anna left arm and with the possibility of Anna progressing.  I will see Anna back,  depending on what Anna labs look like. Marland Kitchen   Volanda Napoleon, MD 11/2/20155:14 PM

## 2014-04-12 LAB — CANCER ANTIGEN 27.29: CA 27.29: 5690 U/mL — AB (ref 0–39)

## 2014-04-12 LAB — LACTATE DEHYDROGENASE: LDH: 340 U/L — ABNORMAL HIGH (ref 94–250)

## 2014-04-15 ENCOUNTER — Other Ambulatory Visit: Payer: Self-pay | Admitting: Hematology & Oncology

## 2014-04-15 ENCOUNTER — Telehealth: Payer: Self-pay | Admitting: Hematology & Oncology

## 2014-04-15 DIAGNOSIS — C50912 Malignant neoplasm of unspecified site of left female breast: Secondary | ICD-10-CM

## 2014-04-15 NOTE — Telephone Encounter (Signed)
I left a message on Ms. Bommarito's cell phone. The lab work clearly shows that her tumor is progressing. Her CA 27.29 is over 5000 now. Previously it was 3000.  I think she just doesn't feel well because of the cancer.  She had been on hormonal therapy which I told her to stop. She is on [place].  She really has poor IV access I think that we are going to do treatment, she will need a Port-A-Cath. I believe that this is the best and safest way for [place] to give treatment.  I had talked to her about this when she was in the office for a visit earlier this week. She understands this. She agreed to this.  We did do a Doppler of her left arm when she was here. This was negative for any type of blood clot.  I will try to get everything set up for her in the next week or so.  She has been trying hard. I just worried that her cancer is becoming more resistant.  I told her to call us if she has any Questions.  Laurey Arrow

## 2014-04-16 ENCOUNTER — Other Ambulatory Visit: Payer: Self-pay | Admitting: Hematology & Oncology

## 2014-04-18 ENCOUNTER — Telehealth: Payer: Self-pay | Admitting: Hematology & Oncology

## 2014-04-18 NOTE — Telephone Encounter (Signed)
Tiffany in IR will call pt to schedule

## 2014-04-20 ENCOUNTER — Other Ambulatory Visit: Payer: Self-pay | Admitting: Radiology

## 2014-04-21 ENCOUNTER — Ambulatory Visit (HOSPITAL_COMMUNITY)
Admission: RE | Admit: 2014-04-21 | Discharge: 2014-04-21 | Disposition: A | Discharge status: Home / Self Care | Payer: Medicare Other | Source: Ambulatory Visit | Attending: Hematology & Oncology | Admitting: Hematology & Oncology

## 2014-04-21 ENCOUNTER — Encounter (HOSPITAL_COMMUNITY): Payer: Self-pay

## 2014-04-21 ENCOUNTER — Other Ambulatory Visit: Payer: Self-pay | Admitting: Hematology & Oncology

## 2014-04-21 DIAGNOSIS — I10 Essential (primary) hypertension: Secondary | ICD-10-CM | POA: Diagnosis not present

## 2014-04-21 DIAGNOSIS — C50919 Malignant neoplasm of unspecified site of unspecified female breast: Secondary | ICD-10-CM | POA: Diagnosis present

## 2014-04-21 DIAGNOSIS — Z7982 Long term (current) use of aspirin: Secondary | ICD-10-CM | POA: Insufficient documentation

## 2014-04-21 DIAGNOSIS — E785 Hyperlipidemia, unspecified: Secondary | ICD-10-CM | POA: Diagnosis not present

## 2014-04-21 DIAGNOSIS — C50912 Malignant neoplasm of unspecified site of left female breast: Secondary | ICD-10-CM

## 2014-04-21 DIAGNOSIS — M810 Age-related osteoporosis without current pathological fracture: Secondary | ICD-10-CM | POA: Diagnosis not present

## 2014-04-21 LAB — CBC WITH DIFFERENTIAL/PLATELET
Basophils Absolute: 0 10*3/uL (ref 0.0–0.1)
Basophils Relative: 0 % (ref 0–1)
EOS PCT: 0 % (ref 0–5)
Eosinophils Absolute: 0 10*3/uL (ref 0.0–0.7)
HCT: 33.8 % — ABNORMAL LOW (ref 36.0–46.0)
HEMOGLOBIN: 11.1 g/dL — AB (ref 12.0–15.0)
Lymphocytes Relative: 23 % (ref 12–46)
Lymphs Abs: 1.5 10*3/uL (ref 0.7–4.0)
MCH: 35 pg — AB (ref 26.0–34.0)
MCHC: 32.8 g/dL (ref 30.0–36.0)
MCV: 106.6 fL — AB (ref 78.0–100.0)
MONOS PCT: 11 % (ref 3–12)
Monocytes Absolute: 0.7 10*3/uL (ref 0.1–1.0)
Neutro Abs: 4.2 10*3/uL (ref 1.7–7.7)
Neutrophils Relative %: 66 % (ref 43–77)
Platelets: 133 10*3/uL — ABNORMAL LOW (ref 150–400)
RBC: 3.17 MIL/uL — ABNORMAL LOW (ref 3.87–5.11)
RDW: 16.9 % — ABNORMAL HIGH (ref 11.5–15.5)
WBC: 6.4 10*3/uL (ref 4.0–10.5)

## 2014-04-21 LAB — PROTIME-INR
INR: 0.92 (ref 0.00–1.49)
Prothrombin Time: 12.4 seconds (ref 11.6–15.2)

## 2014-04-21 MED ORDER — MIDAZOLAM HCL 2 MG/2ML IJ SOLN
INTRAMUSCULAR | Status: AC | PRN
  Administered 2014-04-21 (×3): 1 mg via INTRAVENOUS

## 2014-04-21 MED ORDER — HEPARIN SOD (PORK) LOCK FLUSH 100 UNIT/ML IV SOLN
INTRAVENOUS | Status: AC | PRN
  Administered 2014-04-21: 500 [IU]

## 2014-04-21 MED ORDER — LIDOCAINE-EPINEPHRINE 2 %-1:100000 IJ SOLN
INTRAMUSCULAR | Status: AC
  Filled 2014-04-21: qty 1

## 2014-04-21 MED ORDER — SODIUM CHLORIDE 0.9 % IV SOLN
INTRAVENOUS | Status: DC
  Administered 2014-04-21: 12:00:00 via INTRAVENOUS

## 2014-04-21 MED ORDER — HEPARIN SOD (PORK) LOCK FLUSH 100 UNIT/ML IV SOLN
INTRAVENOUS | Status: AC
  Filled 2014-04-21: qty 5

## 2014-04-21 MED ORDER — LIDOCAINE HCL 1 % IJ SOLN
INTRAMUSCULAR | Status: AC
  Filled 2014-04-21: qty 20

## 2014-04-21 MED ORDER — FENTANYL CITRATE 0.05 MG/ML IJ SOLN
INTRAMUSCULAR | Status: AC | PRN
  Administered 2014-04-21 (×2): 25 ug via INTRAVENOUS

## 2014-04-21 MED ORDER — HYDROCODONE-ACETAMINOPHEN 10-325 MG PO TABS
1.0000 | ORAL_TABLET | Freq: Once | ORAL | Status: AC
  Administered 2014-04-21: 1 via ORAL
  Filled 2014-04-21: qty 1

## 2014-04-21 MED ORDER — CEFAZOLIN SODIUM-DEXTROSE 2-3 GM-% IV SOLR
INTRAVENOUS | Status: AC
  Administered 2014-04-21: 2 g via INTRAVENOUS
  Filled 2014-04-21: qty 50

## 2014-04-21 MED ORDER — FENTANYL CITRATE 0.05 MG/ML IJ SOLN
INTRAMUSCULAR | Status: AC
  Filled 2014-04-21: qty 4

## 2014-04-21 MED ORDER — CEFAZOLIN SODIUM-DEXTROSE 2-3 GM-% IV SOLR
2.0000 g | Freq: Once | INTRAVENOUS | Status: AC
  Administered 2014-04-21: 2 g via INTRAVENOUS

## 2014-04-21 MED ORDER — MIDAZOLAM HCL 2 MG/2ML IJ SOLN
INTRAMUSCULAR | Status: AC
Start: 2014-04-21 — End: 2014-04-21
  Filled 2014-04-21: qty 6

## 2014-04-21 NOTE — Discharge Instructions (Signed)
Implanted Port Insertion, Care After °Refer to this sheet in the next few weeks. These instructions provide you with information on caring for yourself after your procedure. Your health care provider may also give you more specific instructions. Your treatment has been planned according to current medical practices, but problems sometimes occur. Call your health care provider if you have any problems or questions after your procedure. °WHAT TO EXPECT AFTER THE PROCEDURE °After your procedure, it is typical to have the following:  °· Discomfort at the port insertion site. Ice packs to the area will help. °· Bruising on the skin over the port. This will subside in 3-4 days. °HOME CARE INSTRUCTIONS °· After your port is placed, you will get a manufacturer's information card. The card has information about your port. Keep this card with you at all times.   °· Know what kind of port you have. There are many types of ports available.   °· Wear a medical alert bracelet in case of an emergency. This can help alert health care workers that you have a port.   °· The port can stay in for as long as your health care provider believes it is necessary.   °· A home health care nurse may give medicines and take care of the port.   °· You or a family member can get special training and directions for giving medicine and taking care of the port at home.   °SEEK MEDICAL CARE IF:  °· Your port does not flush or you are unable to get a blood return.   °· You have a fever or chills. °SEEK IMMEDIATE MEDICAL CARE IF: °· You have new fluid or pus coming from your incision.   °· You notice a bad smell coming from your incision site.   °· You have swelling, pain, or more redness at the incision or port site.   °· You have chest pain or shortness of breath. °Document Released: 03/17/2013 Document Revised: 06/01/2013 Document Reviewed: 03/17/2013 °Exit[employer]® Patient Information ©2015 [employer]. This information is not intended to replace  advice given to you by your health care provider. Make sure you discuss any questions you have with your health care provider. °Conscious Sedation, Adult, Care After °Refer to this sheet in the next few weeks. These instructions provide you with information on caring for yourself after your procedure. Your health care provider may also give you more specific instructions. Your treatment has been planned according to current medical practices, but problems sometimes occur. Call your health care provider if you have any problems or questions after your procedure. °WHAT TO EXPECT AFTER THE PROCEDURE  °After your procedure: °· You may feel sleepy, clumsy, and have poor balance for several hours. °· Vomiting may occur if you eat too soon after the procedure. °HOME CARE INSTRUCTIONS °· Do not participate in any activities where you could become injured for at least 24 hours. Do not: °¨ Drive. °¨ Swim. °¨ Ride a bicycle. °¨ Operate heavy machinery. °¨ Cook. °¨ Use power tools. °¨ Climb ladders. °¨ Work from a high place. °· Do not make important decisions or sign legal documents until you are improved. °· If you vomit, drink water, juice, or soup when you can drink without vomiting. Make sure you have little or no nausea before eating solid foods. °· Only take over-the-counter or prescription medicines for pain, discomfort, or fever as directed by your health care provider. °· Make sure you and your family fully understand everything about the medicines given to you, including what side effects   may occur. °· You should not drink alcohol, take sleeping pills, or take medicines that cause drowsiness for at least 24 hours. °· If you smoke, do not smoke without supervision. °· If you are feeling better, you may resume normal activities 24 hours after you were sedated. °· Keep all appointments with your health care provider. °SEEK MEDICAL CARE IF: °· Your skin is pale or bluish in color. °· You continue to feel nauseous or  vomit. °· Your pain is getting worse and is not helped by medicine. °· You have bleeding or swelling. °· You are still sleepy or feeling clumsy after 24 hours. °SEEK IMMEDIATE MEDICAL CARE IF: °· You develop a rash. °· You have difficulty breathing. °· You develop any type of allergic problem. °· You have a fever. °MAKE SURE YOU: °· Understand these instructions. °· Will watch your condition. °· Will get help right away if you are not doing well or get worse. °Document Released: 03/17/2013 Document Reviewed: 03/17/2013 °ExitCare® Patient Information ©2015 [employer]. This information is not intended to replace advice given to you by your health care provider. Make sure you discuss any questions you have with your health care provider. ° ° °

## 2014-04-21 NOTE — H&P (Signed)
Chief Complaint: "I'm here for a portacath" Referring Physician:Ennever HPI: Anna Benjamin is an 78 y.o. female with metastatic breast cancer. She is here today for port placement. PMHx, meds, labs reviewed. She had her US liver bx done by our team and did well. Has been NPO this am  Past Medical History:  Past Medical History  Diagnosis Date  . Allergy   . Hyperlipidemia   . Hypertension   . Osteoporosis   . Breast cancer     Past Surgical History:  Past Surgical History  Procedure Laterality Date  . Abdominal hysterectomy  1986  . Tonsillectomy    . Hip surgery Left     pin and plate   . Anterior and posterior repair      Family History:  Family History  Problem Relation Age of Onset  . Coronary artery disease    . Cancer Mother     ? etiology  . Cancer Brother     ? etiology  . Arthritis    . Depression      Social History:  reports that she has never smoked. She has never used smokeless tobacco. She reports that she does not drink alcohol or use illicit drugs.  Allergies: No Known Allergies  Medications:   Medication List    ASK your doctor about these medications        aspirin 81 MG chewable tablet  Chew 81 mg by mouth daily.     CALCIUM 1200 PO  Take 1 tablet by mouth every morning.     cholecalciferol 1000 UNITS tablet  Commonly known as:  VITAMIN D  Take 1,000 Units by mouth daily.     docusate sodium 100 MG capsule  Commonly known as:  COLACE  Take 300 mg by mouth daily.     fexofenadine 180 MG tablet  Commonly known as:  ALLEGRA  Take 180 mg by mouth daily.     fluticasone 50 MCG/ACT nasal spray  Commonly known as:  FLONASE  Place 2 sprays into the nose daily as needed for allergies or rhinitis.     HYDROcodone-acetaminophen 10-325 MG per tablet  Commonly known as:  NORCO  Take 1-2 tablets by mouth every 6 (six) hours as needed for moderate pain.     Influenza vac split quadrivalent PF 0.5 ML injection  Commonly known as:   FLUARIX  Inject 0.5 mLs into the muscle once.     lisinopril-hydrochlorothiazide 10-12.5 MG per tablet  Commonly known as:  PRINZIDE,ZESTORETIC  Take 1 tablet by mouth every morning.     LORazepam 0.5 MG tablet  Commonly known as:  ATIVAN  Take 1 tablet (0.5 mg total) by mouth every 6 (six) hours as needed for anxiety.     megestrol 40 MG tablet  Commonly known as:  MEGACE  Take 20 mg by mouth daily.     MUCINEX DM MAXIMUM STRENGTH 60-1200 MG Tb12  Take 1 tablet by mouth daily.     omeprazole 20 MG capsule  Commonly known as:  PRILOSEC  Take 20 mg by mouth daily as needed (for acid reflex).     omeprazole 20 MG capsule  Commonly known as:  PRILOSEC  TAKE 1 CAPSULE BY MOUTH EVERY DAY**NEEDS OFFICE VISIT     palbociclib 100 MG capsule  Commonly known as:  IBRANCE  Take 1 capsule (100 mg total) by mouth daily with breakfast. Take whole with food.     Toremifene Citrate 60 MG tablet  Commonly known as:  FARESTON  Take 1 tablet (60 mg total) by mouth daily. Samples given by Dr. Marin Olp     traZODone 50 MG tablet  Commonly known as:  DESYREL  Take 50 mg by mouth at bedtime.     traZODone 50 MG tablet  Commonly known as:  DESYREL  TAKE 2 TABLETS BY MOUTH EVERY NIGHT AT BEDTIME AS NEEDED FOR SLEEP     vitamin B-6 250 MG tablet  Take 250 mg by mouth daily.        Please HPI for pertinent positives, otherwise complete 10 system ROS negative.  Physical Exam: BP 128/75 mmHg  Pulse 105  Temp(Src) 97.5 F (36.4 C) (Oral)  Resp 16  SpO2 99% There is no weight on file to calculate BMI.   General Appearance:  Alert, cooperative, no distress, appears stated age  Head:  Normocephalic, without obvious abnormality, atraumatic  ENT: Unremarkable  Neck: Supple, symmetrical, trachea midline  Lungs:   Clear to auscultation bilaterally, no w/r/r, respirations unlabored without use of accessory muscles.  Chest Wall:  No tenderness or deformity  Heart:  Regular rate and rhythm,  S1, S2 normal, no murmur, rub or gallop.  Abdomen:   Soft, non-tender, non distended.  Neurologic: Normal affect, no gross deficits.  Labs: Results for orders placed or performed during the hospital encounter of 04/21/14 (from the past 48 hour(s))  CBC with Differential     Status: Abnormal   Collection Time: 04/21/14 11:50 AM  Result Value Ref Range   WBC 6.4 4.0 - 10.5 K/uL   RBC 3.17 (L) 3.87 - 5.11 MIL/uL   Hemoglobin 11.1 (L) 12.0 - 15.0 g/dL   HCT 33.8 (L) 36.0 - 46.0 %   MCV 106.6 (H) 78.0 - 100.0 fL   MCH 35.0 (H) 26.0 - 34.0 pg   MCHC 32.8 30.0 - 36.0 g/dL   RDW 16.9 (H) 11.5 - 15.5 %   Platelets 133 (L) 150 - 400 K/uL   Neutrophils Relative % 66 43 - 77 %   Neutro Abs 4.2 1.7 - 7.7 K/uL   Lymphocytes Relative 23 12 - 46 %   Lymphs Abs 1.5 0.7 - 4.0 K/uL   Monocytes Relative 11 3 - 12 %   Monocytes Absolute 0.7 0.1 - 1.0 K/uL   Eosinophils Relative 0 0 - 5 %   Eosinophils Absolute 0.0 0.0 - 0.7 K/uL   Basophils Relative 0 0 - 1 %   Basophils Absolute 0.0 0.0 - 0.1 K/uL  Protime-INR     Status: None   Collection Time: 04/21/14 11:50 AM  Result Value Ref Range   Prothrombin Time 12.4 11.6 - 15.2 seconds   INR 0.92 0.00 - 1.49    Imaging: No results found.  Assessment/Plan Metastatic breast cancer For Port placement today Explained procedure, risks, complications, use of sedation Labs reviewed, ok Consent signed in chart  Ascencion Dike PA-C 04/21/2014, 12:45 PM

## 2014-04-21 NOTE — Progress Notes (Signed)
Left arm and hand noted to be quite swollen.  Pt states she is restricted with bp/iv sticks on that side.  Pt states it has been swollen for quite a while.  Iv started on rt arm.

## 2014-04-21 NOTE — Procedures (Signed)
Interventional Radiology Procedure Note  Procedure: Placement of a right IJ approach single lumen PowerPort.  Tip is positioned at the superior cavoatrial junction and catheter is ready for immediate use.  Complications: No immediate Recommendations:  - Ok to shower tomorrow - Do not submerge for 7 days - Routine line care   Signed,  Leyna Vanderkolk S. Tiwanda Threats, DO    

## 2014-04-28 ENCOUNTER — Other Ambulatory Visit: Payer: Self-pay | Admitting: Hematology & Oncology

## 2014-04-28 DIAGNOSIS — C50919 Malignant neoplasm of unspecified site of unspecified female breast: Secondary | ICD-10-CM

## 2014-04-29 ENCOUNTER — Ambulatory Visit: Payer: Medicare Other

## 2014-04-29 ENCOUNTER — Other Ambulatory Visit (HOSPITAL_BASED_OUTPATIENT_CLINIC_OR_DEPARTMENT_OTHER): Payer: Medicare Other | Admitting: Lab

## 2014-04-29 DIAGNOSIS — C50912 Malignant neoplasm of unspecified site of left female breast: Secondary | ICD-10-CM

## 2014-04-29 DIAGNOSIS — C50919 Malignant neoplasm of unspecified site of unspecified female breast: Secondary | ICD-10-CM

## 2014-04-29 MED ORDER — DEXAMETHASONE 4 MG PO TABS: 8.0000 mg | ORAL_TABLET | Freq: Two times a day (BID) | ORAL | Status: DC

## 2014-04-29 MED ORDER — LORAZEPAM 0.5 MG PO TABS: 0.5000 mg | ORAL_TABLET | Freq: Four times a day (QID) | ORAL | Status: DC | PRN

## 2014-04-29 MED ORDER — LIDOCAINE-PRILOCAINE 2.5-2.5 % EX CREA: TOPICAL_CREAM | CUTANEOUS | Status: DC

## 2014-04-29 MED ORDER — PROCHLORPERAZINE MALEATE 10 MG PO TABS: 10.0000 mg | ORAL_TABLET | Freq: Four times a day (QID) | ORAL | Status: DC | PRN

## 2014-04-29 MED ORDER — ONDANSETRON HCL 8 MG PO TABS
8.0000 mg | ORAL_TABLET | Freq: Two times a day (BID) | ORAL | Status: DC
Start: 2014-04-29 — End: 2014-06-08

## 2014-04-29 NOTE — Progress Notes (Unsigned)
Pt did not receive treatment today. Will return on 11/27.

## 2014-05-03 ENCOUNTER — Ambulatory Visit: Payer: Medicare Other | Admitting: Family Medicine

## 2014-05-06 ENCOUNTER — Encounter: Payer: Self-pay | Admitting: Family

## 2014-05-06 ENCOUNTER — Ambulatory Visit (HOSPITAL_BASED_OUTPATIENT_CLINIC_OR_DEPARTMENT_OTHER): Payer: Medicare Other

## 2014-05-06 ENCOUNTER — Ambulatory Visit (HOSPITAL_BASED_OUTPATIENT_CLINIC_OR_DEPARTMENT_OTHER): Payer: Medicare Other | Admitting: Family

## 2014-05-06 ENCOUNTER — Encounter: Payer: Self-pay | Admitting: Hematology & Oncology

## 2014-05-06 ENCOUNTER — Other Ambulatory Visit (HOSPITAL_BASED_OUTPATIENT_CLINIC_OR_DEPARTMENT_OTHER): Payer: Medicare Other | Admitting: Lab

## 2014-05-06 DIAGNOSIS — C50912 Malignant neoplasm of unspecified site of left female breast: Secondary | ICD-10-CM | POA: Diagnosis present

## 2014-05-06 DIAGNOSIS — M810 Age-related osteoporosis without current pathological fracture: Secondary | ICD-10-CM

## 2014-05-06 DIAGNOSIS — C50919 Malignant neoplasm of unspecified site of unspecified female breast: Secondary | ICD-10-CM

## 2014-05-06 DIAGNOSIS — C7951 Secondary malignant neoplasm of bone: Secondary | ICD-10-CM

## 2014-05-06 DIAGNOSIS — Z170 Estrogen receptor positive status [ER+]: Secondary | ICD-10-CM | POA: Diagnosis not present

## 2014-05-06 LAB — CBC WITH DIFFERENTIAL (CANCER CENTER ONLY)
BASO#: 0 10*3/uL (ref 0.0–0.2)
BASO%: 0.3 % (ref 0.0–2.0)
EOS%: 0.3 % (ref 0.0–7.0)
Eosinophils Absolute: 0 10*3/uL (ref 0.0–0.5)
HCT: 32.3 % — ABNORMAL LOW (ref 34.8–46.6)
HEMOGLOBIN: 10.3 g/dL — AB (ref 11.6–15.9)
LYMPH#: 1.5 10*3/uL (ref 0.9–3.3)
LYMPH%: 20 % (ref 14.0–48.0)
MCH: 35.3 pg — ABNORMAL HIGH (ref 26.0–34.0)
MCHC: 31.9 g/dL — AB (ref 32.0–36.0)
MCV: 111 fL — ABNORMAL HIGH (ref 81–101)
MONO#: 1 10*3/uL — ABNORMAL HIGH (ref 0.1–0.9)
MONO%: 12.7 % (ref 0.0–13.0)
NEUT%: 66.7 % (ref 39.6–80.0)
NEUTROS ABS: 5.1 10*3/uL (ref 1.5–6.5)
Platelets: 107 10*3/uL — ABNORMAL LOW (ref 145–400)
RBC: 2.92 10*6/uL — ABNORMAL LOW (ref 3.70–5.32)
RDW: 14.8 % (ref 11.1–15.7)
WBC: 7.7 10*3/uL (ref 3.9–10.0)

## 2014-05-06 LAB — CMP (CANCER CENTER ONLY)
ALBUMIN: 2.9 g/dL — AB (ref 3.3–5.5)
ALT: 28 U/L (ref 10–47)
AST: 110 U/L — AB (ref 11–38)
Alkaline Phosphatase: 457 U/L — ABNORMAL HIGH (ref 26–84)
BUN, Bld: 22 mg/dL (ref 7–22)
CALCIUM: 9 mg/dL (ref 8.0–10.3)
CHLORIDE: 106 meq/L (ref 98–108)
CO2: 23 mEq/L (ref 18–33)
Creat: 1.2 mg/dl (ref 0.6–1.2)
Glucose, Bld: 121 mg/dL — ABNORMAL HIGH (ref 73–118)
POTASSIUM: 4 meq/L (ref 3.3–4.7)
SODIUM: 145 meq/L (ref 128–145)
TOTAL PROTEIN: 6.2 g/dL — AB (ref 6.4–8.1)
Total Bilirubin: 1 mg/dl (ref 0.20–1.60)

## 2014-05-06 MED ORDER — DEXAMETHASONE SODIUM PHOSPHATE 10 MG/ML IJ SOLN
10.0000 mg | Freq: Once | INTRAMUSCULAR | Status: AC
  Administered 2014-05-06: 10 mg via INTRAVENOUS

## 2014-05-06 MED ORDER — LIDOCAINE-PRILOCAINE 2.5-2.5 % EX CREA
TOPICAL_CREAM | CUTANEOUS | Status: AC
  Filled 2014-05-06: qty 30

## 2014-05-06 MED ORDER — ZOLEDRONIC ACID 4 MG/5ML IV CONC
Freq: Once | INTRAVENOUS | Status: AC
  Administered 2014-05-06: 14:00:00 via INTRAVENOUS
  Filled 2014-05-06: qty 3.75

## 2014-05-06 MED ORDER — SODIUM CHLORIDE 0.9 % IJ SOLN
10.0000 mL | INTRAMUSCULAR | Status: DC | PRN
  Administered 2014-05-06: 10 mL
  Filled 2014-05-06: qty 10

## 2014-05-06 MED ORDER — DEXAMETHASONE SODIUM PHOSPHATE 10 MG/ML IJ SOLN
INTRAMUSCULAR | Status: AC
  Filled 2014-05-06: qty 1

## 2014-05-06 MED ORDER — SODIUM CHLORIDE 0.9 % IV SOLN
1.1000 mg/m2 | Freq: Once | INTRAVENOUS | Status: AC
  Administered 2014-05-06: 1.65 mg via INTRAVENOUS
  Filled 2014-05-06: qty 3.3

## 2014-05-06 MED ORDER — HEPARIN SOD (PORK) LOCK FLUSH 100 UNIT/ML IV SOLN
500.0000 [IU] | Freq: Once | INTRAVENOUS | Status: AC | PRN
  Administered 2014-05-06: 500 [IU]
  Filled 2014-05-06: qty 5

## 2014-05-06 MED ORDER — ONDANSETRON 8 MG/50ML IVPB (CHCC)
8.0000 mg | Freq: Once | INTRAVENOUS | Status: AC
  Administered 2014-05-06: 8 mg via INTRAVENOUS

## 2014-05-06 MED ORDER — SODIUM CHLORIDE 0.9 % IV SOLN
Freq: Once | INTRAVENOUS | Status: AC
  Administered 2014-05-06: 13:00:00 via INTRAVENOUS

## 2014-05-06 NOTE — Progress Notes (Signed)
[facility]  Telephone:([phone]) [phone] [phone]) [phone]  ID: Anna Benjamin OB: January 25, 1936 MR#: [id number] LGX#:[id number] Patient Care Team: Rosalita Chessman, DO as PCP - General  DIAGNOSIS:  Metastatic breast cancer-ER positive/HER-2 negative  INTERVAL HISTORY: Anna Benjamin is back for follow-up. She is feeling tired. Her left arm is still swollen. Her doppler study of that arm was negative for a DVT. We will check and see if her insurance will cover a compression sleeve for her. She is itching on her back and legs. She has some redness where she has been scratching but there are no lesions or pustules.  Her last CA 27.29 was 5, 690.  She denies fever, chills, n/v, cough, headache, dizziness, SOB, chest pain, palpitations, abdominal pain, constipation, diarrhea, blood in urine or stool. She denies pain.  No other swelling and no tenderness, numbness or tingling in her extremities.  Her appetite is not good. She says that nothing tastes good. She does drink a lot of fluids.  She will have her first Cycle of Eribulin today.   CURRENT TREATMENT: Fareston/Ibrance-status post 2 cycles Zometa 4 mg IV q. 2 months Eribulin q 21 days  REVIEW OF SYSTEMS: All other 10 point review of systems is negative.   PAST MEDICAL HISTORY: Past Medical History  Diagnosis Date  . Allergy   . Hyperlipidemia   . Hypertension   . Osteoporosis   . Breast cancer     PAST SURGICAL HISTORY: Past Surgical History  Procedure Laterality Date  . Abdominal hysterectomy  1986  . Tonsillectomy    . Hip surgery Left     pin and plate   . Anterior and posterior repair      FAMILY HISTORY Family History  Problem Relation Age of Onset  . Coronary artery disease    . Cancer Mother     ? etiology  . Cancer Brother     ? etiology  . Arthritis    . Depression      GYNECOLOGIC HISTORY:  No LMP recorded. Patient has had a hysterectomy.   SOCIAL HISTORY: History   Social History  . Marital  Status: Single    Spouse Name: N/A    Number of Children: N/A  . Years of Education: N/A   Occupational History  . Not on file.   Social History Main Topics  . Smoking status: Never Smoker   . Smokeless tobacco: Never Used     Comment: never used tobacco  . Alcohol Use: No  . Drug Use: No  . Sexual Activity: No   Other Topics Concern  . Not on file   Social History Narrative    ADVANCED DIRECTIVES:  <no information>  HEALTH MAINTENANCE: History  Substance Use Topics  . Smoking status: Never Smoker   . Smokeless tobacco: Never Used     Comment: never used tobacco  . Alcohol Use: No   Colonoscopy: PAP: Bone density: Lipid panel:  No Known Allergies  Current Outpatient Prescriptions  Medication Sig Dispense Refill  . aspirin 81 MG chewable tablet Chew 81 mg by mouth daily.      . Calcium Carbonate-Vit D-Min (CALCIUM 1200 PO) Take 1 tablet by mouth every morning.     . cholecalciferol (VITAMIN D) 1000 UNITS tablet Take 1,000 Units by mouth daily.      Marland Kitchen dexamethasone (DECADRON) 4 MG tablet Take 2 tablets (8 mg total) by mouth 2 (two) times daily with a meal. Start the day after chemotherapy for 2 days. Take  with food. 30 tablet 1  . Dextromethorphan-Guaifenesin (MUCINEX DM MAXIMUM STRENGTH) 60-1200 MG TB12 Take 1 tablet by mouth daily.     Marland Kitchen docusate sodium (COLACE) 100 MG capsule Take 300 mg by mouth daily.    . fexofenadine (ALLEGRA) 180 MG tablet Take 180 mg by mouth daily.      . fluticasone (FLONASE) 50 MCG/ACT nasal spray Place 2 sprays into the nose daily as needed for allergies or rhinitis.     Marland Kitchen HYDROcodone-acetaminophen (NORCO) 10-325 MG per tablet Take 1-2 tablets by mouth every 6 (six) hours as needed for moderate pain. 120 tablet 0  . lidocaine-prilocaine (EMLA) cream Apply to affected area once 30 g 3  . lisinopril-hydrochlorothiazide (PRINZIDE,ZESTORETIC) 10-12.5 MG per tablet Take 1 tablet by mouth every morning.    Marland Kitchen LORazepam (ATIVAN) 0.5 MG tablet  Take 1 tablet (0.5 mg total) by mouth every 6 (six) hours as needed (Nausea or vomiting). 30 tablet 0  . megestrol (MEGACE) 40 MG tablet Take 20 mg by mouth daily.    Marland Kitchen omeprazole (PRILOSEC) 20 MG capsule Take 20 mg by mouth daily as needed (for acid reflex).    . ondansetron (ZOFRAN) 8 MG tablet Take 1 tablet (8 mg total) by mouth 2 (two) times daily. Start the day after chemo for 2 days. Then take as needed for nausea or vomiting. 30 tablet 1  . prochlorperazine (COMPAZINE) 10 MG tablet Take 1 tablet (10 mg total) by mouth every 6 (six) hours as needed (Nausea or vomiting). 30 tablet 1  . Pyridoxine HCl (VITAMIN B-6) 250 MG tablet Take 250 mg by mouth daily.    . traZODone (DESYREL) 50 MG tablet TAKE 2 TABLETS BY MOUTH EVERY NIGHT AT BEDTIME AS NEEDED FOR SLEEP 60 tablet 0   No current facility-administered medications for this visit.   Facility-Administered Medications Ordered in Other Visits  Medication Dose Route Frequency Provider Last Rate Last Dose  . eriBULin mesylate (HALAVEN) 1.65 mg in sodium chloride 0.9 % 100 mL chemo infusion  1.1 mg/m2 (Treatment Plan Actual) Intravenous Once Volanda Napoleon, MD      . heparin lock flush 100 unit/mL  500 Units Intracatheter Once PRN Volanda Napoleon, MD      . sodium chloride 0.9 % injection 10 mL  10 mL Intracatheter PRN Volanda Napoleon, MD      . sodium chloride 0.9 % with zolendronic acid (ZOMETA) 3 mg   Intravenous Once Volanda Napoleon, MD        OBJECTIVE: Filed Vitals:   05/06/14 1219  BP: 126/65  Pulse: 99  Temp: 97.6 F (36.4 C)  Resp: 20    Filed Weights   05/06/14 1219  Weight: 119 lb (53.978 kg)   ECOG FS:1 - Symptomatic but completely ambulatory Ocular: Sclerae unicteric, pupils equal, round and reactive to light Ear-nose-throat: Oropharynx clear, dentition fair Lymphatic: No cervical or supraclavicular adenopathy Lungs no rales or rhonchi, good excursion bilaterally Heart regular rate and rhythm, no murmur  appreciated Abd soft, nontender, positive bowel sounds MSK no focal spinal tenderness, no joint edema Neuro: non-focal, well-oriented, appropriate affect Breasts: Deferred  LAB RESULTS: CMP     Component Value Date/Time   NA 145 05/06/2014 1139   NA 137 01/24/2014 1152   K 4.0 05/06/2014 1139   K 3.7 01/24/2014 1152   CL 106 05/06/2014 1139   CL 103 01/24/2014 1152   CO2 23 05/06/2014 1139   CO2 22 01/24/2014 1152   GLUCOSE  121* 05/06/2014 1139   GLUCOSE 144* 01/24/2014 1152   BUN 22 05/06/2014 1139   BUN 21 01/24/2014 1152   CREATININE 1.2 05/06/2014 1139   CREATININE 0.83 01/24/2014 1152   CALCIUM 9.0 05/06/2014 1139   CALCIUM 9.7 01/24/2014 1152   PROT 6.2* 05/06/2014 1139   PROT 6.5 01/24/2014 1152   ALBUMIN 4.0 01/24/2014 1152   AST 110* 05/06/2014 1139   AST 72* 01/24/2014 1152   ALT 28 05/06/2014 1139   ALT 27 01/24/2014 1152   ALKPHOS 457* 05/06/2014 1139   ALKPHOS 378* 01/24/2014 1152   BILITOT 1.00 05/06/2014 1139   BILITOT 1.1 01/24/2014 1152   GFRNONAA >60 10/17/2010 0129   GFRAA  10/17/2010 0129    >60        The eGFR has been calculated using the MDRD equation. This calculation has not been validated in all clinical situations. eGFR's persistently <60 mL/min signify possible Chronic Kidney Disease.   INo results found for: SPEP, UPEP Lab Results  Component Value Date   WBC 7.7 05/06/2014   NEUTROABS 5.1 05/06/2014   HGB 10.3* 05/06/2014   HCT 32.3* 05/06/2014   MCV 111* 05/06/2014   PLT 107* 05/06/2014   No components found for: [id number] No results for input(s): INR in the last 168 hours.  STUDIES:  ASSESSMENT/PLAN: Anna Benjamin is 78 year old female with metastatic breast cancer. She has had metastatic disease now for about 3 or 4 years. We have had her on chemotherapy. She has gone through all the lines of chemotherapy that she could tolerate. She also tried hormonal therapy along with Ibrance. Her CA 27.29, alk/phos and liver enzymes  continue to increase. She will have Cycle 1 of Eribulin today.  She had her port a cath placed and the surgical site looks good. No s/s of infection.  She has a lot of financial constraints. We will see if her insurance will cover a compression sleeve for her.  She can try some hydrocortisone cream to relieve her itching.  She has her treatment and appointment schedule.  She knows to call here with any questions or concerns and to go to the ED in the event of an emergency. We can certainly see her sooner if need be.   Eliezer Bottom, NP 05/06/2014 1:49 PM

## 2014-05-06 NOTE — Progress Notes (Signed)
MD aware of lab work and is okay to proceed with treatment.

## 2014-05-06 NOTE — Patient Instructions (Signed)
Zoledronic Acid injection (Hypercalcemia, Oncology) What is this medicine? ZOLEDRONIC ACID (ZOE le dron ik AS id) lowers the amount of calcium loss from bone. It is used to treat too much calcium in your blood from cancer. It is also used to prevent complications of cancer that has spread to the bone. This medicine may be used for other purposes; ask your health care provider or pharmacist if you have questions. COMMON BRAND NAME(S): Zometa What should I tell my health care provider before I take this medicine? They need to know if you have any of these conditions: -aspirin-sensitive asthma -cancer, especially if you are receiving medicines used to treat cancer -dental disease or wear dentures -infection -kidney disease -receiving corticosteroids like dexamethasone or prednisone -an unusual or allergic reaction to zoledronic acid, other medicines, foods, dyes, or preservatives -pregnant or trying to get pregnant -breast-feeding How should I use this medicine? This medicine is for infusion into a vein. It is given by a health care professional in a hospital or clinic setting. Talk to your pediatrician regarding the use of this medicine in children. Special care may be needed. Overdosage: If you think you have taken too much of this medicine contact a poison control center or emergency room at once. NOTE: This medicine is only for you. Do not share this medicine with others. What if I miss a dose? It is important not to miss your dose. Call your doctor or health care professional if you are unable to keep an appointment. What may interact with this medicine? -certain antibiotics given by injection -NSAIDs, medicines for pain and inflammation, like ibuprofen or naproxen -some diuretics like bumetanide, furosemide -teriparatide -thalidomide This list may not describe all possible interactions. Give your health care provider a list of all the medicines, herbs, non-prescription drugs, or  dietary supplements you use. Also tell them if you smoke, drink alcohol, or use illegal drugs. Some items may interact with your medicine. What should I watch for while using this medicine? Visit your doctor or health care professional for regular checkups. It may be some time before you see the benefit from this medicine. Do not stop taking your medicine unless your doctor tells you to. Your doctor may order blood tests or other tests to see how you are doing. Women should inform their doctor if they wish to become pregnant or think they might be pregnant. There is a potential for serious side effects to an unborn child. Talk to your health care professional or pharmacist for more information. You should make sure that you get enough calcium and vitamin D while you are taking this medicine. Discuss the foods you eat and the vitamins you take with your health care professional. Some people who take this medicine have severe bone, joint, and/or muscle pain. This medicine may also increase your risk for jaw problems or a broken thigh bone. Tell your doctor right away if you have severe pain in your jaw, bones, joints, or muscles. Tell your doctor if you have any pain that does not go away or that gets worse. Tell your dentist and dental surgeon that you are taking this medicine. You should not have major dental surgery while on this medicine. See your dentist to have a dental exam and fix any dental problems before starting this medicine. Take good care of your teeth while on this medicine. Make sure you see your dentist for regular follow-up appointments. What side effects may I notice from receiving this medicine? Side effects that   you should report to your doctor or health care professional as soon as possible: -allergic reactions like skin rash, itching or hives, swelling of the face, lips, or tongue -anxiety, confusion, or depression -breathing problems -changes in vision -eye pain -feeling faint or  lightheaded, falls -jaw pain, especially after dental work -mouth sores -muscle cramps, stiffness, or weakness -trouble passing urine or change in the amount of urine Side effects that usually do not require medical attention (report to your doctor or health care professional if they continue or are bothersome): -bone, joint, or muscle pain -constipation -diarrhea -fever -hair loss -irritation at site where injected -loss of appetite -nausea, vomiting -stomach upset -trouble sleeping -trouble swallowing -weak or tired This list may not describe all possible side effects. Call your doctor for medical advice about side effects. You may report side effects to FDA at 1-800-FDA-1088. Where should I keep my medicine? This drug is given in a hospital or clinic and will not be stored at home. NOTE: This sheet is a summary. It may not cover all possible information. If you have questions about this medicine, talk to your doctor, pharmacist, or health care provider.  2015, Elsevier/Gold Standard. (2012-11-05 13:03:13) Eribulin solution for injection What is this medicine? ERIBULIN is a chemotherapy drug. It is used to treat breast cancer. This medicine may be used for other purposes; ask your health care provider or pharmacist if you have questions. COMMON BRAND NAME(S): Halaven What should I tell my health care provider before I take this medicine? They need to know if you have any of these conditions: -heart disease -kidney disease -liver disease -low blood counts, like low white cell, platelet, or red cell counts -an unusual or allergic reaction to eribulin, other medicines, foods, dyes, or preservatives -pregnant or trying to get pregnant -breast-feeding How should I use this medicine? This medicine is for infusion into a vein. It is given by a health care professional in a hospital or clinic setting. Talk to your pediatrician regarding the use of this medicine in children. Special  care may be needed. Overdosage: If you think you've taken too much of this medicine contact a poison control center or emergency room at once. Overdosage: If you think you have taken too much of this medicine contact a poison control center or emergency room at once. NOTE: This medicine is only for you. Do not share this medicine with others. What if I miss a dose? It is important not to miss your dose. Call your doctor or health care professional if you are unable to keep an appointment. What may interact with this medicine? Do not take this medicine with any of the following medications: -amiodarone -astemizole -arsenic trioxide -bepridil -bretylium -chloroquine -chlorpromazine -cisapride -clarithromycin -dextromethorphan, quinidine -disopyramide -dofetilide -droperidol -dronedarone -erythromycin -grepafloxacin -halofantrine -haloperidol -ibutilide -levomethadyl -mesoridazine -methadone -pentamidine -procainamide -quinidine -pimozide -posaconazole -probucol -propafenone -saquinavir -sotalol -sparfloxacin -terfenadine -thioridazine -troleandomycin -ziprasidone This list may not describe all possible interactions. Give your health care provider a list of all the medicines, herbs, non-prescription drugs, or dietary supplements you use. Also tell them if you smoke, drink alcohol, or use illegal drugs. Some items may interact with your medicine. What should I watch for while using this medicine? Your condition will be monitored carefully while you are receiving this medicine. This drug may make you feel generally unwell. This is not uncommon, as chemotherapy can affect healthy cells as well as cancer cells. Report any side effects. Continue your course of treatment even though you feel  ill unless your doctor tells you to stop. Call your doctor or health care professional for advice if you get a fever, chills or sore throat, or other symptoms of a cold or flu. Do not treat  yourself. This drug decreases your body's ability to fight infections. Try to avoid being around people who are sick. This medicine may increase your risk to bruise or bleed. Call your doctor or health care professional if you notice any unusual bleeding. Be careful brushing and flossing your teeth or using a toothpick because you may get an infection or bleed more easily. If you have any dental work done, tell your dentist you are receiving this medicine. Avoid taking products that contain aspirin, acetaminophen, ibuprofen, naproxen, or ketoprofen unless instructed by your doctor. These medicines may hide a fever. Do not become pregnant while taking this medicine. Women should inform their doctor if they wish to become pregnant or think they might be pregnant. There is a potential for serious side effects to an unborn child. Talk to your health care professional or pharmacist for more information. Do not breast-feed an infant while taking this medicine. What side effects may I notice from receiving this medicine? Side effects that you should report to your doctor or health care professional as soon as possible: -allergic reactions like skin rash, itching or hives, swelling of the face, lips, or tongue -low blood counts - this medicine may decrease the number of white blood cells, red blood cells and platelets. You may be at increased risk for infections and bleeding. -signs of infection - fever or chills, cough, sore throat, pain or difficulty passing urine -signs of decreased platelets or bleeding - bruising, pinpoint red spots on the skin, black, tarry stools, blood in the urine -signs of decreased red blood cells - unusually weak or tired, fainting spells, lightheadedness -pain, tingling, numbness in the hands or feet Side effects that usually do not require medical attention (Report these to your doctor or health care professional if they continue or are bothersome.): -constipation -hair  loss -headache -loss of appetite -muscle or joint pain -nausea, vomiting -stomach pain This list may not describe all possible side effects. Call your doctor for medical advice about side effects. You may report side effects to FDA at 1-800-FDA-1088. Where should I keep my medicine? This drug is given in a hospital or clinic and will not be stored at home. NOTE: This sheet is a summary. It may not cover all possible information. If you have questions about this medicine, talk to your doctor, pharmacist, or health care provider.  2015, Elsevier/Gold Standard. (2009-05-18 23:04:37)

## 2014-05-10 ENCOUNTER — Other Ambulatory Visit: Payer: Self-pay | Admitting: Family

## 2014-05-10 ENCOUNTER — Other Ambulatory Visit: Payer: Medicare Other | Admitting: Family

## 2014-05-10 DIAGNOSIS — C50919 Malignant neoplasm of unspecified site of unspecified female breast: Secondary | ICD-10-CM

## 2014-05-11 ENCOUNTER — Other Ambulatory Visit: Payer: Self-pay | Admitting: Hematology & Oncology

## 2014-05-20 ENCOUNTER — Encounter: Payer: Self-pay | Admitting: Nurse Practitioner

## 2014-05-20 ENCOUNTER — Ambulatory Visit (HOSPITAL_BASED_OUTPATIENT_CLINIC_OR_DEPARTMENT_OTHER): Payer: Medicare Other

## 2014-05-20 ENCOUNTER — Encounter: Payer: Self-pay | Admitting: Hematology & Oncology

## 2014-05-20 ENCOUNTER — Ambulatory Visit (HOSPITAL_BASED_OUTPATIENT_CLINIC_OR_DEPARTMENT_OTHER): Payer: Medicare Other | Admitting: Hematology & Oncology

## 2014-05-20 ENCOUNTER — Other Ambulatory Visit (HOSPITAL_BASED_OUTPATIENT_CLINIC_OR_DEPARTMENT_OTHER): Payer: Medicare Other | Admitting: Lab

## 2014-05-20 VITALS — BP 113/67 | HR 100 | Temp 97.8°F | Resp 14 | Ht 60.0 in | Wt 120.0 lb

## 2014-05-20 DIAGNOSIS — C787 Secondary malignant neoplasm of liver and intrahepatic bile duct: Secondary | ICD-10-CM | POA: Diagnosis not present

## 2014-05-20 DIAGNOSIS — C50911 Malignant neoplasm of unspecified site of right female breast: Secondary | ICD-10-CM

## 2014-05-20 DIAGNOSIS — C50912 Malignant neoplasm of unspecified site of left female breast: Secondary | ICD-10-CM | POA: Diagnosis present

## 2014-05-20 DIAGNOSIS — C50919 Malignant neoplasm of unspecified site of unspecified female breast: Secondary | ICD-10-CM

## 2014-05-20 DIAGNOSIS — R609 Edema, unspecified: Secondary | ICD-10-CM | POA: Diagnosis not present

## 2014-05-20 DIAGNOSIS — J0110 Acute frontal sinusitis, unspecified: Secondary | ICD-10-CM

## 2014-05-20 DIAGNOSIS — Z5111 Encounter for antineoplastic chemotherapy: Secondary | ICD-10-CM

## 2014-05-20 LAB — CMP (CANCER CENTER ONLY)
ALT(SGPT): 37 U/L (ref 10–47)
AST: 159 U/L — ABNORMAL HIGH (ref 11–38)
Albumin: 3.2 g/dL — ABNORMAL LOW (ref 3.3–5.5)
Alkaline Phosphatase: 566 U/L — ABNORMAL HIGH (ref 26–84)
BILIRUBIN TOTAL: 1.8 mg/dL — AB (ref 0.20–1.60)
BUN: 36 mg/dL — AB (ref 7–22)
CO2: 21 mEq/L (ref 18–33)
Calcium: 9.8 mg/dL (ref 8.0–10.3)
Chloride: 106 mEq/L (ref 98–108)
Creat: 1.3 mg/dl — ABNORMAL HIGH (ref 0.6–1.2)
GLUCOSE: 126 mg/dL — AB (ref 73–118)
Potassium: 4.1 mEq/L (ref 3.3–4.7)
Sodium: 141 mEq/L (ref 128–145)
Total Protein: 5.9 g/dL — ABNORMAL LOW (ref 6.4–8.1)

## 2014-05-20 LAB — CBC WITH DIFFERENTIAL (CANCER CENTER ONLY)
BASO#: 0 10*3/uL (ref 0.0–0.2)
BASO%: 0.6 % (ref 0.0–2.0)
EOS ABS: 0 10*3/uL (ref 0.0–0.5)
EOS%: 0.2 % (ref 0.0–7.0)
HCT: 34.4 % — ABNORMAL LOW (ref 34.8–46.6)
HGB: 11.1 g/dL — ABNORMAL LOW (ref 11.6–15.9)
LYMPH#: 1.4 10*3/uL (ref 0.9–3.3)
LYMPH%: 21.8 % (ref 14.0–48.0)
MCH: 35.8 pg — AB (ref 26.0–34.0)
MCHC: 32.3 g/dL (ref 32.0–36.0)
MCV: 111 fL — ABNORMAL HIGH (ref 81–101)
MONO#: 1.1 10*3/uL — ABNORMAL HIGH (ref 0.1–0.9)
MONO%: 17.4 % — AB (ref 0.0–13.0)
NEUT#: 3.9 10*3/uL (ref 1.5–6.5)
NEUT%: 60 % (ref 39.6–80.0)
PLATELETS: 114 10*3/uL — AB (ref 145–400)
RBC: 3.1 10*6/uL — ABNORMAL LOW (ref 3.70–5.32)
RDW: 15.1 % (ref 11.1–15.7)
WBC: 6.4 10*3/uL (ref 3.9–10.0)

## 2014-05-20 LAB — TECHNOLOGIST REVIEW CHCC SATELLITE

## 2014-05-20 MED ORDER — DEXAMETHASONE SODIUM PHOSPHATE 10 MG/ML IJ SOLN
INTRAMUSCULAR | Status: AC
Start: 2014-05-20 — End: 2014-05-20
  Filled 2014-05-20: qty 1

## 2014-05-20 MED ORDER — DEXAMETHASONE SODIUM PHOSPHATE 10 MG/ML IJ SOLN
10.0000 mg | Freq: Once | INTRAMUSCULAR | Status: AC
  Administered 2014-05-20: 10 mg via INTRAVENOUS

## 2014-05-20 MED ORDER — METOLAZONE 2.5 MG PO TABS: ORAL_TABLET | ORAL | Status: DC

## 2014-05-20 MED ORDER — SODIUM CHLORIDE 0.9 % IJ SOLN
10.0000 mL | INTRAMUSCULAR | Status: DC | PRN
  Administered 2014-05-20: 10 mL
  Filled 2014-05-20: qty 10

## 2014-05-20 MED ORDER — ONDANSETRON 8 MG/50ML IVPB (CHCC)
8.0000 mg | Freq: Once | INTRAVENOUS | Status: AC
  Administered 2014-05-20: 8 mg via INTRAVENOUS

## 2014-05-20 MED ORDER — SODIUM CHLORIDE 0.9 % IV SOLN
1.1500 mg/m2 | Freq: Once | INTRAVENOUS | Status: AC
  Administered 2014-05-20: 1.7 mg via INTRAVENOUS
  Filled 2014-05-20: qty 3.4

## 2014-05-20 MED ORDER — SODIUM CHLORIDE 0.9 % IV SOLN
Freq: Once | INTRAVENOUS | Status: AC
  Administered 2014-05-20: 13:00:00 via INTRAVENOUS

## 2014-05-20 MED ORDER — FUROSEMIDE 40 MG PO TABS: ORAL_TABLET | ORAL | Status: DC

## 2014-05-20 MED ORDER — HEPARIN SOD (PORK) LOCK FLUSH 100 UNIT/ML IV SOLN
500.0000 [IU] | Freq: Once | INTRAVENOUS | Status: AC | PRN
  Administered 2014-05-20: 500 [IU]
  Filled 2014-05-20: qty 5

## 2014-05-20 NOTE — Patient Instructions (Signed)
Eribulin solution for injection What is this medicine? ERIBULIN is a chemotherapy drug. It is used to treat breast cancer. This medicine may be used for other purposes; ask your health care provider or pharmacist if you have questions. COMMON BRAND NAME(S): Halaven What should I tell my health care provider before I take this medicine? They need to know if you have any of these conditions: -heart disease -kidney disease -liver disease -low blood counts, like low white cell, platelet, or red cell counts -an unusual or allergic reaction to eribulin, other medicines, foods, dyes, or preservatives -pregnant or trying to get pregnant -breast-feeding How should I use this medicine? This medicine is for infusion into a vein. It is given by a health care professional in a hospital or clinic setting. Talk to your pediatrician regarding the use of this medicine in children. Special care may be needed. Overdosage: If you think you've taken too much of this medicine contact a poison control center or emergency room at once. Overdosage: If you think you have taken too much of this medicine contact a poison control center or emergency room at once. NOTE: This medicine is only for you. Do not share this medicine with others. What if I miss a dose? It is important not to miss your dose. Call your doctor or health care professional if you are unable to keep an appointment. What may interact with this medicine? Do not take this medicine with any of the following medications: -amiodarone -astemizole -arsenic trioxide -bepridil -bretylium -chloroquine -chlorpromazine -cisapride -clarithromycin -dextromethorphan,  quinidine -disopyramide -dofetilide -droperidol -dronedarone -erythromycin -grepafloxacin -halofantrine -haloperidol -ibutilide -levomethadyl -mesoridazine -methadone -pentamidine -procainamide -quinidine -pimozide -posaconazole -probucol -propafenone -saquinavir -sotalol -sparfloxacin -terfenadine -thioridazine -troleandomycin -ziprasidone This list may not describe all possible interactions. Give your health care provider a list of all the medicines, herbs, non-prescription drugs, or dietary supplements you use. Also tell them if you smoke, drink alcohol, or use illegal drugs. Some items may interact with your medicine. What should I watch for while using this medicine? Your condition will be monitored carefully while you are receiving this medicine. This drug may make you feel generally unwell. This is not uncommon, as chemotherapy can affect healthy cells as well as cancer cells. Report any side effects. Continue your course of treatment even though you feel ill unless your doctor tells you to stop. Call your doctor or health care professional for advice if you get a fever, chills or sore throat, or other symptoms of a cold or flu. Do not treat yourself. This drug decreases your body's ability to fight infections. Try to avoid being around people who are sick. This medicine may increase your risk to bruise or bleed. Call your doctor or health care professional if you notice any unusual bleeding. Be careful brushing and flossing your teeth or using a toothpick because you may get an infection or bleed more easily. If you have any dental work done, tell your dentist you are receiving this medicine. Avoid taking products that contain aspirin, acetaminophen, ibuprofen, naproxen, or ketoprofen unless instructed by your doctor. These medicines may hide a fever. Do not become pregnant while taking this medicine. Women should inform their doctor if they wish to become pregnant or think  they might be pregnant. There is a potential for serious side effects to an unborn child. Talk to your health care professional or pharmacist for more information. Do not breast-feed an infant while taking this medicine. What side effects may I notice from receiving this medicine?   Side effects that you should report to your doctor or health care professional as soon as possible: -allergic reactions like skin rash, itching or hives, swelling of the face, lips, or tongue -low blood counts - this medicine may decrease the number of white blood cells, red blood cells and platelets. You may be at increased risk for infections and bleeding. -signs of infection - fever or chills, cough, sore throat, pain or difficulty passing urine -signs of decreased platelets or bleeding - bruising, pinpoint red spots on the skin, black, tarry stools, blood in the urine -signs of decreased red blood cells - unusually weak or tired, fainting spells, lightheadedness -pain, tingling, numbness in the hands or feet Side effects that usually do not require medical attention (Report these to your doctor or health care professional if they continue or are bothersome.): -constipation -hair loss -headache -loss of appetite -muscle or joint pain -nausea, vomiting -stomach pain This list may not describe all possible side effects. Call your doctor for medical advice about side effects. You may report side effects to FDA at 1-800-FDA-1088. Where should I keep my medicine? This drug is given in a hospital or clinic and will not be stored at home. NOTE: This sheet is a summary. It may not cover all possible information. If you have questions about this medicine, talk to your doctor, pharmacist, or health care provider.  2015, Elsevier/Gold Standard. (2009-05-18 23:04:37)  

## 2014-05-20 NOTE — Progress Notes (Signed)
Hematology and Oncology Follow Up Visit  Anna Benjamin [id number] 09/23/1935 78 y.o. 05/20/2014   Principle Diagnosis:   Metastatic breast cancer-progressive (ER+/HER2 -)  Current Therapy:    Erubilin status post 1 cycle  Zometa every month     Interim History:  Ms.  Anna Benjamin is back for follow-up. She still does not feel all that good. She has a lot of swelling in her legs. I suspected this probably is from her liver disease. She has extensive liver metastases.  I will see if Zaroxolyn and Lasix can help her. Hopefully, this will help with some of the swelling in the legs. If not, though it may have to use albumin infusions.  I just believe that with what is going on, she probably still has progression of her disease. Her liver function test today look a low bit worse.  Her appetite is okay. She's had no nausea or vomiting.  She did have an episode of diarrhea which was self-limited.  She is sleeping okay.  Pain control with Norco seems to be helping her.  I would say that her performance status is ECOG 1-2.  Medications: Current outpatient prescriptions: aspirin 81 MG chewable tablet, Chew 81 mg by mouth daily.  , Disp: , Rfl: ;  Calcium Carbonate-Vit D-Min (CALCIUM 1200 PO), Take 1 tablet by mouth every morning. , Disp: , Rfl: ;  cholecalciferol (VITAMIN D) 1000 UNITS tablet, Take 1,000 Units by mouth daily.  , Disp: , Rfl:  dexamethasone (DECADRON) 4 MG tablet, Take 2 tablets (8 mg total) by mouth 2 (two) times daily with a meal. Start the day after chemotherapy for 2 days. Take with food., Disp: 30 tablet, Rfl: 1;  Dextromethorphan-Guaifenesin (MUCINEX DM MAXIMUM STRENGTH) 60-1200 MG TB12, Take 1 tablet by mouth daily. , Disp: , Rfl: ;  docusate sodium (COLACE) 100 MG capsule, Take 300 mg by mouth daily., Disp: , Rfl:  fexofenadine (ALLEGRA) 180 MG tablet, Take 180 mg by mouth daily.  , Disp: , Rfl: ;  fluticasone (FLONASE) 50 MCG/ACT nasal spray, Place 2 sprays into the  nose daily as needed for allergies or rhinitis. , Disp: , Rfl: ;  HYDROcodone-acetaminophen (NORCO) 10-325 MG per tablet, Take 1-2 tablets by mouth every 6 (six) hours as needed for moderate pain., Disp: 120 tablet, Rfl: 0 lidocaine-prilocaine (EMLA) cream, Apply to affected area once, Disp: 30 g, Rfl: 3;  LORazepam (ATIVAN) 0.5 MG tablet, Take 1 tablet (0.5 mg total) by mouth every 6 (six) hours as needed (Nausea or vomiting)., Disp: 30 tablet, Rfl: 0;  omeprazole (PRILOSEC) 20 MG capsule, Take 20 mg by mouth daily as needed (for acid reflex)., Disp: , Rfl:  prochlorperazine (COMPAZINE) 10 MG tablet, Take 1 tablet (10 mg total) by mouth every 6 (six) hours as needed (Nausea or vomiting)., Disp: 30 tablet, Rfl: 1;  Pyridoxine HCl (VITAMIN B-6) 250 MG tablet, Take 250 mg by mouth daily., Disp: , Rfl: ;  traZODone (DESYREL) 50 MG tablet, TAKE 2 TABLETS BY MOUTH EVERY NIGHT AT BEDTIME AS NEEDED FOR SLEEP, Disp: 60 tablet, Rfl: 0 triamterene-hydrochlorothiazide (MAXZIDE-25) 37.5-25 MG per tablet, TAKE 1 TABLET BY MOUTH DAILY AS NEEDED, Disp: 30 tablet, Rfl: 0;  furosemide (LASIX) 40 MG tablet, Take 1 pill daily 1 hour after metolazone., Disp: 30 tablet, Rfl: 4;  metolazone (ZAROXOLYN) 2.5 MG tablet, Take 1 pill daily 1 hour before Lasix, Disp: 30 tablet, Rfl: 4 ondansetron (ZOFRAN) 8 MG tablet, Take 1 tablet (8 mg total) by mouth 2 (two) times  daily. Start the day after chemo for 2 days. Then take as needed for nausea or vomiting. (Patient not taking: Reported on 05/20/2014), Disp: 30 tablet, Rfl: 1  Allergies: No Known Allergies  Past Medical History, Surgical history, Social history, and Family History were reviewed and updated.  Review of Systems: As above  Physical Exam:  height is 5' (1.524 m) and weight is 120 lb (54.432 kg). Her oral temperature is 97.8 F (36.6 C). Her blood pressure is 113/67 and her pulse is 100. Her respiration is 14.   Petit white female in no obvious distress. Head and neck  exam shows no ocular or oral lesions. She has no palpable cervical or supraclavicular lymph nodes. Lungs are clear. Cardiac exam regular rate and rhythm with no murmurs, rubs or bruits. Abdomen is soft. She has good bowel sounds. There is no fluid wave. There is no palpable liver or spleen tip. Back exam shows no tenderness over the spine, ribs or hips. Extremities shows 3+ edema in her legs. This is pitting edema. She has decent range of motion of her joints. She has good strength. Skin exam shows no rashes, ecchymoses or petechia. Neurological exam is nonfocal.  Lab Results  Component Value Date   WBC 6.4 05/20/2014   HGB 11.1* 05/20/2014   HCT 34.4* 05/20/2014   MCV 111* 05/20/2014   PLT 114* 05/20/2014     Chemistry      Component Value Date/Time   NA 141 05/20/2014 1050   NA 137 01/24/2014 1152   K 4.1 05/20/2014 1050   K 3.7 01/24/2014 1152   CL 106 05/20/2014 1050   CL 103 01/24/2014 1152   CO2 21 05/20/2014 1050   CO2 22 01/24/2014 1152   BUN 36* 05/20/2014 1050   BUN 21 01/24/2014 1152   CREATININE 1.3* 05/20/2014 1050   CREATININE 0.83 01/24/2014 1152      Component Value Date/Time   CALCIUM 9.8 05/20/2014 1050   CALCIUM 9.7 01/24/2014 1152   ALKPHOS 566* 05/20/2014 1050   ALKPHOS 378* 01/24/2014 1152   AST 159* 05/20/2014 1050   AST 72* 01/24/2014 1152   ALT 37 05/20/2014 1050   ALT 27 01/24/2014 1152   BILITOT 1.80* 05/20/2014 1050   BILITOT 1.1 01/24/2014 1152         Impression and Plan: Anna Benjamin is 78 year old white female with metastatic breast cancer. She is having a little bit of a tough time. Again, I would have to think that her cancer is progressing.  She's on had one cycle of treatment so I think we just have to be patient. Her CA-27-29 will clearly tell us if she is responding or not.  If she does not respond to this course of therapy, I'm not sure what else we could try. I don't know how well she would be in order to try any more  treatment.  I think that if we got her leg edema better, she would feel better.  We'll have her come back in one week. We'll have her see Judson Roch.  I probably would not rescan her until after 3 cycles of treatment.  I spent about 30 minutes with her today.   Volanda Napoleon, MD 12/11/20155:54 PM

## 2014-05-20 NOTE — Progress Notes (Signed)
Script for a mastectomy sleeve was faxed to [facility] supply @ [phone].

## 2014-05-26 IMAGING — PT NM PET TUM IMG RESTAG (PS) SKULL BASE T - THIGH
1 of 6 series · 1 of 25 positions shown · non-contrast
Comparison: Multiple exams, including 01/08/2012

CLINICAL DATA: Subsequent treatment strategy for breast cancer.

NUCLEAR MEDICINE PET SKULL BASE TO THIGH
Fasting Blood Glucose:  97
TECHNIQUE: 14.8 mCi F-18 FDG was injected intravenously. CT data
was obtained and used for attenuation correction and anatomic
localization only.  (This was not acquired as a diagnostic CT
examination.) Additional exam technical data entered on
technologist worksheet.

[Series 4: pet wb · axial · 5.0mm · 4.06mm/px · 1 of 297 slices shown]
[im 178/297]
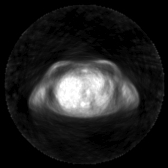

[1 of 25 positions shown; findings below may reference images not displayed]

FINDINGS: Neck: No hypermetabolic lymph nodes in the neck.

Chest:  No hypermetabolic mediastinal or hilar nodes.  No
suspicious pulmonary nodules on the CT data.  Stable 3 mm right
middle lobe pulmonary nodule without appreciable hypermetabolic
activity.  Stable noncalcified right anterior pleural plaque
without hypermetabolic activity.  Dense rim calcification bilateral
breast implants noted.

Abdomen/Pelvis:  Significant reduction in size and number as well
as hypermetabolic activity and hepatic metastatic lesions.  The
dominant hypermetabolic lesion in the lateral segment left hepatic
lobe is only very faintly hypermetabolic on today's exam, with a
maximum standard uptake value in this vicinity of 4.0 compared to
prior measurement of 8.3.  Activity in the ascending colon is
likely physiologic.  Atrophic left kidney observed.

Suspected 2 mm left mid kidney nonobstructive calculus.

Skeleton:  Scattered osseous metastatic lesions demonstrate reduced
conspicuity and reduced hypermetabolic activity compared to prior
(stents, a right iliac bone lesion which previously had a maximum
standard uptake value of 6.5 currently has a maximum standard
uptake value of 3.1.  Similar reduction is observed throughout the
various osseous metastatic lesions.  Thoracic compression fractures
noted at T12, T11, T9, T8, and T7.
IMPRESSION: 1.  Overall improvement, with significant reduction in size and
hypermetabolic activity of the hepatic metastatic lesions, and
prominently reduced activity in conspicuity of the osseous
metastatic lesions.

## 2014-05-27 ENCOUNTER — Telehealth: Payer: Self-pay | Admitting: *Deleted

## 2014-05-27 ENCOUNTER — Ambulatory Visit: Payer: Medicare Other

## 2014-05-27 ENCOUNTER — Ambulatory Visit (HOSPITAL_BASED_OUTPATIENT_CLINIC_OR_DEPARTMENT_OTHER): Payer: Medicare Other

## 2014-05-27 ENCOUNTER — Encounter: Payer: Self-pay | Admitting: Family

## 2014-05-27 ENCOUNTER — Ambulatory Visit (HOSPITAL_BASED_OUTPATIENT_CLINIC_OR_DEPARTMENT_OTHER): Payer: Medicare Other | Admitting: Family

## 2014-05-27 ENCOUNTER — Other Ambulatory Visit (HOSPITAL_BASED_OUTPATIENT_CLINIC_OR_DEPARTMENT_OTHER): Payer: Medicare Other | Admitting: Lab

## 2014-05-27 DIAGNOSIS — R609 Edema, unspecified: Secondary | ICD-10-CM | POA: Diagnosis present

## 2014-05-27 DIAGNOSIS — C50912 Malignant neoplasm of unspecified site of left female breast: Secondary | ICD-10-CM | POA: Diagnosis present

## 2014-05-27 DIAGNOSIS — C50911 Malignant neoplasm of unspecified site of right female breast: Secondary | ICD-10-CM

## 2014-05-27 DIAGNOSIS — M7989 Other specified soft tissue disorders: Secondary | ICD-10-CM | POA: Diagnosis not present

## 2014-05-27 DIAGNOSIS — J0110 Acute frontal sinusitis, unspecified: Secondary | ICD-10-CM

## 2014-05-27 LAB — CBC WITH DIFFERENTIAL (CANCER CENTER ONLY)
BASO#: 0 10*3/uL (ref 0.0–0.2)
BASO%: 1.8 % (ref 0.0–2.0)
EOS%: 0 % (ref 0.0–7.0)
Eosinophils Absolute: 0 10*3/uL (ref 0.0–0.5)
HCT: 32.7 % — ABNORMAL LOW (ref 34.8–46.6)
HGB: 11.2 g/dL — ABNORMAL LOW (ref 11.6–15.9)
LYMPH#: 0.6 10*3/uL — ABNORMAL LOW (ref 0.9–3.3)
LYMPH%: 50.5 % — ABNORMAL HIGH (ref 14.0–48.0)
MCH: 37 pg — ABNORMAL HIGH (ref 26.0–34.0)
MCHC: 34.3 g/dL (ref 32.0–36.0)
MCV: 108 fL — AB (ref 81–101)
MONO#: 0.3 10*3/uL (ref 0.1–0.9)
MONO%: 30.3 % — AB (ref 0.0–13.0)
NEUT%: 17.4 % — ABNORMAL LOW (ref 39.6–80.0)
NEUTROS ABS: 0.2 10*3/uL — AB (ref 1.5–6.5)
RBC: 3.03 10*6/uL — ABNORMAL LOW (ref 3.70–5.32)
RDW: 15.3 % (ref 11.1–15.7)
WBC: 1.1 10*3/uL — AB (ref 3.9–10.0)

## 2014-05-27 LAB — CANCER ANTIGEN 27.29: CA 27.29: 8009 U/mL — AB (ref 0–39)

## 2014-05-27 LAB — CMP (CANCER CENTER ONLY)
ALK PHOS: 640 U/L — AB (ref 26–84)
ALT(SGPT): 61 U/L — ABNORMAL HIGH (ref 10–47)
AST: 209 U/L (ref 11–38)
Albumin: 3.4 g/dL (ref 3.3–5.5)
BUN, Bld: 49 mg/dL — ABNORMAL HIGH (ref 7–22)
CO2: 20 meq/L (ref 18–33)
Calcium: 8.2 mg/dL (ref 8.0–10.3)
Chloride: 100 mEq/L (ref 98–108)
Creat: 2.3 mg/dl — ABNORMAL HIGH (ref 0.6–1.2)
Glucose, Bld: 149 mg/dL — ABNORMAL HIGH (ref 73–118)
Potassium: 3.7 mEq/L (ref 3.3–4.7)
Sodium: 139 mEq/L (ref 128–145)
Total Bilirubin: 2.9 mg/dl — ABNORMAL HIGH (ref 0.20–1.60)
Total Protein: 6 g/dL — ABNORMAL LOW (ref 6.4–8.1)

## 2014-05-27 LAB — LACTATE DEHYDROGENASE: LDH: 575 U/L — ABNORMAL HIGH (ref 94–250)

## 2014-05-27 MED ORDER — SODIUM CHLORIDE 0.9 % IV SOLN
250.0000 mL | Freq: Once | INTRAVENOUS | Status: AC
  Administered 2014-05-27: 250 mL via INTRAVENOUS

## 2014-05-27 MED ORDER — SODIUM CHLORIDE 0.9 % IV SOLN: INTRAVENOUS | Status: DC

## 2014-05-27 NOTE — Patient Instructions (Signed)
Dehydration, Adult Dehydration is when you lose more fluids from the body than you take in. Vital organs like the kidneys, brain, and heart cannot function without a proper amount of fluids and salt. Any loss of fluids from the body can cause dehydration.  CAUSES   Vomiting.  Diarrhea.  Excessive sweating.  Excessive urine output.  Fever. SYMPTOMS  Mild dehydration  Thirst.  Dry lips.  Slightly dry mouth. Moderate dehydration  Very dry mouth.  Sunken eyes.  Skin does not bounce back quickly when lightly pinched and released.  Dark urine and decreased urine production.  Decreased tear production.  Headache. Severe dehydration  Very dry mouth.  Extreme thirst.  Rapid, weak pulse (more than 100 beats per minute at rest).  Cold hands and feet.  Not able to sweat in spite of heat and temperature.  Rapid breathing.  Blue lips.  Confusion and lethargy.  Difficulty being awakened.  Minimal urine production.  No tears. DIAGNOSIS  Your caregiver will diagnose dehydration based on your symptoms and your exam. Blood and urine tests will help confirm the diagnosis. The diagnostic evaluation should also identify the cause of dehydration. TREATMENT  Treatment of mild or moderate dehydration can often be done at home by increasing the amount of fluids that you drink. It is best to drink small amounts of fluid more often. Drinking too much at one time can make vomiting worse. Refer to the home care instructions below. Severe dehydration needs to be treated at the hospital where you will probably be given intravenous (IV) fluids that contain water and electrolytes. HOME CARE INSTRUCTIONS   Ask your caregiver about specific rehydration instructions.  Drink enough fluids to keep your urine clear or pale yellow.  Drink small amounts frequently if you have nausea and vomiting.  Eat as you normally do.  Avoid:  Foods or drinks high in sugar.  Carbonated  drinks.  Juice.  Extremely hot or cold fluids.  Drinks with caffeine.  Fatty, greasy foods.  Alcohol.  Tobacco.  Overeating.  Gelatin desserts.  Wash your hands well to avoid spreading bacteria and viruses.  Only take over-the-counter or prescription medicines for pain, discomfort, or fever as directed by your caregiver.  Ask your caregiver if you should continue all prescribed and over-the-counter medicines.  Keep all follow-up appointments with your caregiver. SEEK MEDICAL CARE IF:  You have abdominal pain and it increases or stays in one area (localizes).  You have a rash, stiff neck, or severe headache.  You are irritable, sleepy, or difficult to awaken.  You are weak, dizzy, or extremely thirsty. SEEK IMMEDIATE MEDICAL CARE IF:   You are unable to keep fluids down or you get worse despite treatment.  You have frequent episodes of vomiting or diarrhea.  You have blood or green matter (bile) in your vomit.  You have blood in your stool or your stool looks black and tarry.  You have not urinated in 6 to 8 hours, or you have only urinated a small amount of very dark urine.  You have a fever.  You faint. MAKE SURE YOU:   Understand these instructions.  Will watch your condition.  Will get help right away if you are not doing well or get worse. Document Released: 05/27/2005 Document Revised: 08/19/2011 Document Reviewed: 01/14/2011 ExitCare Patient Information 2015 [employer]. This information is not intended to replace advice given to you by your health care provider. Make sure you discuss any questions you have with your health care   provider.  

## 2014-05-27 NOTE — Progress Notes (Signed)
[facility]  [phone] [phone]  ID: Annette Bertelson OB: Aug 03, 1935 MR#: [id number] TDV#:[id number] Patient Care Team: Rosalita Chessman, DO as PCP - General  DIAGNOSIS:  Metastatic breast cancer-ER positive/HER-2 negative  INTERVAL HISTORY: Ms.Monte is back for follow-up. Her left arm and legs are all still swollen. She had an allergic reaction to the Lasix and Zaroxolyn. She states that she was confused and her tongue was swollen. Both of these issues are better now. We will have her stop the Lasix and Zaroxolyn. She also states that despite taking both medications since Saturday she has not diuresed much fluid.  Her CA 27.29 was 5,690 in November.  She denies fever, chills, n/v, cough, headache, dizziness, SOB, chest pain, palpitations, abdominal pain, constipation, diarrhea, blood in urine or stool. She denies pain.  No tenderness, numbness or tingling in her extremities.  Her appetite is not good. She says that nothing tastes good. She does drink a lot of fluids.   CURRENT TREATMENT: Fareston/Ibrance-status post 2 cycles Zometa 4 mg IV q. 2 months Eribulin q 21 days  REVIEW OF SYSTEMS: All other 10 point review of systems is negative.   PAST MEDICAL HISTORY: Past Medical History  Diagnosis Date  . Allergy   . Hyperlipidemia   . Hypertension   . Osteoporosis   . Breast cancer     PAST SURGICAL HISTORY: Past Surgical History  Procedure Laterality Date  . Abdominal hysterectomy  1986  . Tonsillectomy    . Hip surgery Left     pin and plate   . Anterior and posterior repair      FAMILY HISTORY Family History  Problem Relation Age of Onset  . Coronary artery disease    . Cancer Mother     ? etiology  . Cancer Brother     ? etiology  . Arthritis    . Depression      GYNECOLOGIC HISTORY:  No LMP recorded. Patient has had a hysterectomy.   SOCIAL HISTORY: History   Social History  . Marital Status: Single    Spouse Name:  N/A    Number of Children: N/A  . Years of Education: N/A   Occupational History  . Not on file.   Social History Main Topics  . Smoking status: Never Smoker   . Smokeless tobacco: Never Used     Comment: never used tobacco  . Alcohol Use: No  . Drug Use: No  . Sexual Activity: No   Other Topics Concern  . Not on file   Social History Narrative    ADVANCED DIRECTIVES:  <no information>  HEALTH MAINTENANCE: History  Substance Use Topics  . Smoking status: Never Smoker   . Smokeless tobacco: Never Used     Comment: never used tobacco  . Alcohol Use: No   Colonoscopy: PAP: Bone density: Lipid panel:  No Known Allergies  Current Outpatient Prescriptions  Medication Sig Dispense Refill  . aspirin 81 MG chewable tablet Chew 81 mg by mouth daily.      . Calcium Carbonate-Vit D-Min (CALCIUM 1200 PO) Take 1 tablet by mouth every morning.     . cholecalciferol (VITAMIN D) 1000 UNITS tablet Take 1,000 Units by mouth daily.      Marland Kitchen dexamethasone (DECADRON) 4 MG tablet Take 2 tablets (8 mg total) by mouth 2 (two) times daily with a meal. Start the day after chemotherapy for 2 days. Take with food. 30 tablet 1  . Dextromethorphan-Guaifenesin ([place] DM MAXIMUM STRENGTH)  60-1200 MG TB12 Take 1 tablet by mouth daily.     Marland Kitchen docusate sodium (COLACE) 100 MG capsule Take 300 mg by mouth daily.    . fexofenadine (ALLEGRA) 180 MG tablet Take 180 mg by mouth daily.      . fluticasone (FLONASE) 50 MCG/ACT nasal spray Place 2 sprays into the nose daily as needed for allergies or rhinitis.     . furosemide (LASIX) 40 MG tablet Take 1 pill daily 1 hour after metolazone. 30 tablet 4  . HYDROcodone-acetaminophen (NORCO) 10-325 MG per tablet Take 1-2 tablets by mouth every 6 (six) hours as needed for moderate pain. 120 tablet 0  . lidocaine-prilocaine (EMLA) cream Apply to affected area once 30 g 3  . LORazepam (ATIVAN) 0.5 MG tablet Take 1 tablet (0.5 mg total) by mouth every 6 (six) hours as  needed (Nausea or vomiting). 30 tablet 0  . metolazone (ZAROXOLYN) 2.5 MG tablet Take 1 pill daily 1 hour before Lasix 30 tablet 4  . omeprazole (PRILOSEC) 20 MG capsule Take 20 mg by mouth daily as needed (for acid reflex).    . ondansetron (ZOFRAN) 8 MG tablet Take 1 tablet (8 mg total) by mouth 2 (two) times daily. Start the day after chemo for 2 days. Then take as needed for nausea or vomiting. (Patient not taking: Reported on 05/20/2014) 30 tablet 1  . prochlorperazine (COMPAZINE) 10 MG tablet Take 1 tablet (10 mg total) by mouth every 6 (six) hours as needed (Nausea or vomiting). 30 tablet 1  . Pyridoxine HCl (VITAMIN B-6) 250 MG tablet Take 250 mg by mouth daily.    . traZODone (DESYREL) 50 MG tablet TAKE 2 TABLETS BY MOUTH EVERY NIGHT AT BEDTIME AS NEEDED FOR SLEEP 60 tablet 0  . triamterene-hydrochlorothiazide (MAXZIDE-25) 37.5-25 MG per tablet TAKE 1 TABLET BY MOUTH DAILY AS NEEDED 30 tablet 0   No current facility-administered medications for this visit.    OBJECTIVE: There were no vitals filed for this visit.  There were no vitals filed for this visit. ECOG FS:1 - Symptomatic but completely ambulatory Ocular: Sclerae unicteric, pupils equal, round and reactive to light Ear-nose-throat: Oropharynx clear, dentition fair Lymphatic: No cervical or supraclavicular adenopathy Lungs no rales or rhonchi, good excursion bilaterally Heart regular rate and rhythm, no murmur appreciated Abd soft, nontender, positive bowel sounds MSK no focal spinal tenderness, no joint edema Neuro: non-focal, well-oriented, appropriate affect Breasts: Deferred  LAB RESULTS: CMP     Component Value Date/Time   NA 141 05/20/2014 1050   NA 137 01/24/2014 1152   K 4.1 05/20/2014 1050   K 3.7 01/24/2014 1152   CL 106 05/20/2014 1050   CL 103 01/24/2014 1152   CO2 21 05/20/2014 1050   CO2 22 01/24/2014 1152   GLUCOSE 126* 05/20/2014 1050   GLUCOSE 144* 01/24/2014 1152   BUN 36* 05/20/2014 1050   BUN  21 01/24/2014 1152   CREATININE 1.3* 05/20/2014 1050   CREATININE 0.83 01/24/2014 1152   CALCIUM 9.8 05/20/2014 1050   CALCIUM 9.7 01/24/2014 1152   PROT 5.9* 05/20/2014 1050   PROT 6.5 01/24/2014 1152   ALBUMIN 4.0 01/24/2014 1152   AST 159* 05/20/2014 1050   AST 72* 01/24/2014 1152   ALT 37 05/20/2014 1050   ALT 27 01/24/2014 1152   ALKPHOS 566* 05/20/2014 1050   ALKPHOS 378* 01/24/2014 1152   BILITOT 1.80* 05/20/2014 1050   BILITOT 1.1 01/24/2014 1152   GFRNONAA >60 10/17/2010 0129   GFRAA  10/17/2010 0129    >60        The eGFR has been calculated using the MDRD equation. This calculation has not been validated in all clinical situations. eGFR's persistently <60 mL/min signify possible Chronic Kidney Disease.   INo results found for: SPEP, UPEP Lab Results  Component Value Date   WBC 6.4 05/20/2014   NEUTROABS 3.9 05/20/2014   HGB 11.1* 05/20/2014   HCT 34.4* 05/20/2014   MCV 111* 05/20/2014   PLT 114* 05/20/2014   No components found for: [id number] No results for input(s): INR in the last 168 hours.  STUDIES:  ASSESSMENT/PLAN: Ms. Reis is 78 year old female with metastatic breast cancer. She has had metastatic disease now for about 3 or 4 years. We have had her on chemotherapy. She has gone through all the lines of chemotherapy that she could tolerate. She also tried hormonal therapy along with Ibrance. Her CA 27.29, alk/phos and liver enzymes continue to increase. She appears to be in liver failure. Her AST is 209 and ALT is 61. This is more than likely the cause of her swelling.  We will not treat her today. We will give her 250 ml of fluids.  We will also consult hospice for her.  We will see her back in 2 weeks for labs, follow-up and zometa.  She knows to call here with any questions or concerns and to go to the ED in the event of an emergency. We can certainly see her sooner if need be.   Eliezer Bottom, NP 05/27/2014 1:45 PM

## 2014-05-27 NOTE — Telephone Encounter (Signed)
Referred patient to [facility]. Verbal order for symptom management given per Dr Marin Olp.

## 2014-06-07 ENCOUNTER — Telehealth: Payer: Self-pay | Admitting: *Deleted

## 2014-06-07 NOTE — Telephone Encounter (Signed)
Hospice RN wants okay to refer patient to the [facility]. Dr Marin Olp is fine with this. [facility] will make referral for patient.

## 2014-06-08 ENCOUNTER — Encounter: Payer: Self-pay | Admitting: Lab

## 2014-06-08 ENCOUNTER — Ambulatory Visit (HOSPITAL_BASED_OUTPATIENT_CLINIC_OR_DEPARTMENT_OTHER): Admitting: Hematology & Oncology

## 2014-06-08 ENCOUNTER — Ambulatory Visit (HOSPITAL_BASED_OUTPATIENT_CLINIC_OR_DEPARTMENT_OTHER)

## 2014-06-08 ENCOUNTER — Other Ambulatory Visit: Payer: Self-pay | Admitting: Hematology & Oncology

## 2014-06-08 ENCOUNTER — Ambulatory Visit (HOSPITAL_BASED_OUTPATIENT_CLINIC_OR_DEPARTMENT_OTHER): Admitting: Lab

## 2014-06-08 VITALS — BP 99/52 | HR 106 | Temp 97.3°F | Resp 16

## 2014-06-08 DIAGNOSIS — C787 Secondary malignant neoplasm of liver and intrahepatic bile duct: Secondary | ICD-10-CM

## 2014-06-08 DIAGNOSIS — C50911 Malignant neoplasm of unspecified site of right female breast: Secondary | ICD-10-CM

## 2014-06-08 DIAGNOSIS — C7951 Secondary malignant neoplasm of bone: Secondary | ICD-10-CM

## 2014-06-08 DIAGNOSIS — M7989 Other specified soft tissue disorders: Secondary | ICD-10-CM | POA: Diagnosis not present

## 2014-06-08 DIAGNOSIS — C50912 Malignant neoplasm of unspecified site of left female breast: Secondary | ICD-10-CM

## 2014-06-08 DIAGNOSIS — E8809 Other disorders of plasma-protein metabolism, not elsewhere classified: Secondary | ICD-10-CM

## 2014-06-08 DIAGNOSIS — H5789 Other specified disorders of eye and adnexa: Secondary | ICD-10-CM

## 2014-06-08 DIAGNOSIS — C50919 Malignant neoplasm of unspecified site of unspecified female breast: Secondary | ICD-10-CM

## 2014-06-08 LAB — CBC WITH DIFFERENTIAL (CANCER CENTER ONLY)
BASO#: 0.1 10*3/uL (ref 0.0–0.2)
BASO%: 0.6 % (ref 0.0–2.0)
EOS ABS: 0 10*3/uL (ref 0.0–0.5)
EOS%: 0 % (ref 0.0–7.0)
HCT: 36.8 % (ref 34.8–46.6)
HEMOGLOBIN: 12.1 g/dL (ref 11.6–15.9)
LYMPH#: 1.7 10*3/uL (ref 0.9–3.3)
LYMPH%: 16.5 % (ref 14.0–48.0)
MCH: 35.7 pg — ABNORMAL HIGH (ref 26.0–34.0)
MCHC: 32.9 g/dL (ref 32.0–36.0)
MCV: 109 fL — AB (ref 81–101)
MONO#: 1.8 10*3/uL — AB (ref 0.1–0.9)
MONO%: 17.6 % — AB (ref 0.0–13.0)
NEUT#: 6.8 10*3/uL — ABNORMAL HIGH (ref 1.5–6.5)
NEUT%: 65.3 % (ref 39.6–80.0)
Platelets: 52 10*3/uL — ABNORMAL LOW (ref 145–400)
RBC: 3.39 10*6/uL — AB (ref 3.70–5.32)
RDW: 17.9 % — ABNORMAL HIGH (ref 11.1–15.7)
WBC: 10.4 10*3/uL — AB (ref 3.9–10.0)

## 2014-06-08 LAB — CMP (CANCER CENTER ONLY)
ALBUMIN: 2.9 g/dL — AB (ref 3.3–5.5)
ALT(SGPT): 54 U/L — ABNORMAL HIGH (ref 10–47)
AST: 129 U/L — ABNORMAL HIGH (ref 11–38)
Alkaline Phosphatase: 805 U/L — ABNORMAL HIGH (ref 26–84)
BILIRUBIN TOTAL: 2.7 mg/dL — AB (ref 0.20–1.60)
BUN: 35 mg/dL — AB (ref 7–22)
CO2: 25 meq/L (ref 18–33)
Calcium: 8.8 mg/dL (ref 8.0–10.3)
Chloride: 99 mEq/L (ref 98–108)
Creat: 2.3 mg/dl — ABNORMAL HIGH (ref 0.6–1.2)
GLUCOSE: 98 mg/dL (ref 73–118)
Potassium: 4.1 mEq/L (ref 3.3–4.7)
Sodium: 140 mEq/L (ref 128–145)
TOTAL PROTEIN: 5.6 g/dL — AB (ref 6.4–8.1)

## 2014-06-08 LAB — CANCER ANTIGEN 27.29: CA 27.29: 5817 U/mL — ABNORMAL HIGH (ref 0–39)

## 2014-06-08 LAB — LACTATE DEHYDROGENASE: LDH: 679 U/L — AB (ref 94–250)

## 2014-06-08 MED ORDER — HEPARIN SOD (PORK) LOCK FLUSH 100 UNIT/ML IV SOLN
500.0000 [IU] | Freq: Once | INTRAVENOUS | Status: DC | PRN
  Filled 2014-06-08: qty 5

## 2014-06-08 MED ORDER — MORPHINE SULFATE 4 MG/ML IJ SOLN
2.0000 mg | Freq: Once | INTRAMUSCULAR | Status: AC
  Administered 2014-06-08: 2 mg via INTRAVENOUS

## 2014-06-08 MED ORDER — ALTEPLASE 2 MG IJ SOLR
2.0000 mg | Freq: Once | INTRAMUSCULAR | Status: DC | PRN
  Filled 2014-06-08: qty 2

## 2014-06-08 MED ORDER — SULFACETAMIDE SODIUM 10 % OP SOLN: 1.0000 [drp] | Freq: Four times a day (QID) | OPHTHALMIC | Status: DC

## 2014-06-08 MED ORDER — FUROSEMIDE 10 MG/ML IJ SOLN
40.0000 mg | Freq: Once | INTRAMUSCULAR | Status: AC
  Administered 2014-06-08: 40 mg via INTRAVENOUS

## 2014-06-08 MED ORDER — DOXYCYCLINE HYCLATE 100 MG PO TABS: 100.0000 mg | ORAL_TABLET | Freq: Two times a day (BID) | ORAL | Status: DC

## 2014-06-08 MED ORDER — SODIUM CHLORIDE 0.9 % IJ SOLN
10.0000 mL | INTRAMUSCULAR | Status: DC | PRN
  Filled 2014-06-08: qty 10

## 2014-06-08 MED ORDER — MORPHINE SULFATE 4 MG/ML IJ SOLN
INTRAMUSCULAR | Status: AC
  Filled 2014-06-08: qty 1

## 2014-06-08 MED ORDER — FUROSEMIDE 10 MG/ML IJ SOLN
INTRAMUSCULAR | Status: AC
  Filled 2014-06-08: qty 4

## 2014-06-08 MED ORDER — ALBUMIN HUMAN 25 % IV SOLN
50.0000 g | Freq: Once | INTRAVENOUS | Status: AC
  Administered 2014-06-08: 50 g via INTRAVENOUS
  Filled 2014-06-08: qty 200

## 2014-06-08 MED ORDER — ZOLEDRONIC ACID 4 MG/100ML IV SOLN: 4.0000 mg | Freq: Once | INTRAVENOUS | Status: DC

## 2014-06-08 NOTE — Patient Instructions (Signed)
° ° ° ° ° ° ° ° ° ° ° ° ° ° ° ° ° ° ° ° ° ° ° ° ° ° ° ° ° ° ° ° ° ° ° ° ° ° ° ° ° ° ° ° ° ° ° ° ° ° ° ° ° ° ° ° ° ° ° ° ° ° ° ° ° ° ° ° ° ° ° ° ° ° ° ° ° ° ° ° ° ° ° ° ° ° ° ° ° ° ° ° ° ° ° ° ° ° ° ° ° ° ° ° °  Albumin °Albumin tests are done as a screen for a liver disorder or kidney disease or to check nutritional status, especially in hospitalized patients (prealbumin is sometimes used instead of albumin in this situation). °Albumin is the most plentiful protein in the blood plasma. It keeps fluid from leaking out of blood vessels; nourishes tissues; and transports hormones, vitamins, drugs, and ions like calcium throughout the body. Albumin is made in the liver and is extremely sensitive to liver damage. The concentration of albumin drops when the liver is damaged, with kidney disease (nephrotic syndrome), when a person is malnourished, if a person experiences inflammation in the body, or with shock. Albumin increases when a person is dehydrated. °PREPARATION FOR TEST °No preparation or fasting is necessary. A blood sample is taken by a needle from a vein. Tell the person doing the test if you are pregnant. °NORMAL FINDINGS  °Adult/Elderly °· Total Protein: 6.4-8.3 g/dL or 64-83g/L (SI units) °· Albumin; 3.5-5 g/dL or 35-50 g/L (SI units) °· Globulin 2.3-3.4 g/dL °¨ Alpha1 globulin: 0.1-3 g/dL or 1-3 g/L (SI units) °¨ Alpha2 globulin: 0.6-1 g/dL or6-10 g/L (SI units) °¨ Beta globulin: 0.7-1.1 g/dL or 7-11 g/L (SI units) °Children °· Total protein. °¨ Premature infant: 4.2-7.6 g/L °¨ Newborn: 4.6-7.4 g/dL °¨ Infant: 6-6.7 g/L °· Albumin. °¨ Premature infant: 3-4.2 g/dL °¨ Newborn: 3.5-5.4 g/dL °¨ Infant: 4.4-5.4 g/dL °¨ Child: 4-5.9 g/dL °Ranges for normal findings may vary among different laboratories and hospitals. You should always check with your doctor after having lab work or other tests done to discuss the meaning of your test results and whether your values are considered within normal limits. °MEANING OF  TEST  °Your caregiver will go over the test results with you and discuss the importance and meaning of your results, as well as treatment options and the need for additional tests if necessary. °OBTAINING THE TEST RESULTS °It is your responsibility to obtain your test results. Ask the lab or department performing the test when and how you will get your results. °Document Released: 06/18/2004 Document Revised: 08/19/2011 Document Reviewed: 05/01/2008 °ExitCare® Patient Information ©2015 [employer]. This information is not intended to replace advice given to you by your health care provider. Make sure you discuss any questions you have with your health care provider. ° °

## 2014-06-08 NOTE — Progress Notes (Signed)
Ok to give Lasix with BP of 99/52 per Dr Marin Olp. dph

## 2014-06-08 NOTE — Progress Notes (Signed)
Hematology and Oncology Follow Up Visit  Anna Benjamin [id number] 17-Jul-1935 78 y.o. 06/08/2014   Principle Diagnosis:   Metastatic breast cancer-progressive (ER+/HER2 -)  Current Therapy:   Supportive care with hospice    Interim History:  Anna Benjamin is back for follow-up. She now is being followed by hospice. I had a very long talk with her a couple weeks ago. It was obvious that her cancer was progressing. She is having more complications from treatment. Her CA 27.29 was now up to 8000.  We talked about end-of-life issues. She does not want to be kept alive on machines. As such, she is a no code.  Hospice is helping out at home. They're doing a good job.  She is having continued issues with edema in her legs. I think this is because of her extensive hepatic metastasis. I think that she has portal hypertension because of the hepatic metastasis. This without much to wean due to try to help this. I don't think just diuretics 1 to help.  Sometimes giving IV albumin followed by IV Lasix can help. I thought this might be reasonable.  She has a lot of watery eyes. We'll try some eyedrops to help with this.  She's not eating as much.   I would say that her performance status is ECOG 2-3  Medications: Current outpatient prescriptions: Calcium Carbonate-Vit D-Min (CALCIUM 1200 PO), Take 1 tablet by mouth every morning. , Disp: , Rfl: ;  cholecalciferol (VITAMIN D) 1000 UNITS tablet, Take 1,000 Units by mouth daily.  , Disp: , Rfl: ;  docusate sodium (COLACE) 100 MG capsule, Take 300 mg by mouth daily., Disp: , Rfl: ;  doxycycline (VIBRA-TABS) 100 MG tablet, Take 1 tablet (100 mg total) by mouth 2 (two) times daily., Disp: 20 tablet, Rfl: 0 fluticasone (FLONASE) 50 MCG/ACT nasal spray, Place 2 sprays into the nose daily as needed for allergies or rhinitis. , Disp: , Rfl: ;  HYDROcodone-acetaminophen (NORCO) 10-325 MG per tablet, Take 1-2 tablets by mouth every 6 (six) hours as needed for  moderate pain., Disp: 120 tablet, Rfl: 0;  lidocaine-prilocaine (EMLA) cream, Apply to affected area once, Disp: 30 g, Rfl: 3 LORazepam (ATIVAN) 0.5 MG tablet, Take 1 tablet (0.5 mg total) by mouth every 6 (six) hours as needed (Nausea or vomiting)., Disp: 30 tablet, Rfl: 0;  omeprazole (PRILOSEC) 20 MG capsule, Take 20 mg by mouth daily as needed (for acid reflex)., Disp: , Rfl: ;  prochlorperazine (COMPAZINE) 10 MG tablet, Take 1 tablet (10 mg total) by mouth every 6 (six) hours as needed (Nausea or vomiting)., Disp: 30 tablet, Rfl: 1 sulfacetamide (BLEPH-10) 10 % ophthalmic solution, Place 1 drop into both eyes 4 (four) times daily., Disp: 15 mL, Rfl: 0;  traZODone (DESYREL) 50 MG tablet, TAKE 2 TABLETS BY MOUTH EVERY NIGHT AT BEDTIME AS NEEDED FOR SLEEP, Disp: 60 tablet, Rfl: 0 No current facility-administered medications for this visit. Facility-Administered Medications Ordered in Other Visits: alteplase (CATHFLO ACTIVASE) injection 2 mg, 2 mg, Intracatheter, Once PRN, Volanda Napoleon, MD;  heparin lock flush 100 unit/mL, 500 Units, Intracatheter, Once PRN, Volanda Napoleon, MD;  sodium chloride 0.9 % injection 10 mL, 10 mL, Intracatheter, PRN, Volanda Napoleon, MD  Allergies: No Known Allergies  Past Medical History, Surgical history, Social history, and Family History were reviewed and updated.  Review of Systems: As above  Physical Exam:  vitals were not taken for this visit.  Petit white female in no obvious distress. Head  and neck exam shows no ocular or oral lesions. She has no palpable cervical or supraclavicular lymph nodes. Lungs are clear. Cardiac exam regular rate and rhythm with no murmurs, rubs or bruits. Abdomen is soft. She has good bowel sounds. There is no fluid wave. There is no palpable liver or spleen tip. Back exam shows no tenderness over the spine, ribs or hips. Extremities shows 3+ edema in her legs. This is pitting edema. She has decent range of motion of her joints. She  has good strength. Skin exam shows no rashes, ecchymoses or petechia. Neurological exam is nonfocal.  Lab Results  Component Value Date   WBC 10.4* 06/08/2014   HGB 12.1 06/08/2014   HCT 36.8 06/08/2014   MCV 109* 06/08/2014   PLT 52* 06/08/2014     Chemistry      Component Value Date/Time   NA 140 06/08/2014 1218   NA 137 01/24/2014 1152   K 4.1 06/08/2014 1218   K 3.7 01/24/2014 1152   CL 99 06/08/2014 1218   CL 103 01/24/2014 1152   CO2 25 06/08/2014 1218   CO2 22 01/24/2014 1152   BUN 35* 06/08/2014 1218   BUN 21 01/24/2014 1152   CREATININE 2.3* 06/08/2014 1218   CREATININE 0.83 01/24/2014 1152      Component Value Date/Time   CALCIUM 8.8 06/08/2014 1218   CALCIUM 9.7 01/24/2014 1152   ALKPHOS 805* 06/08/2014 1218   ALKPHOS 378* 01/24/2014 1152   AST 129* 06/08/2014 1218   AST 72* 01/24/2014 1152   ALT 54* 06/08/2014 1218   ALT 27 01/24/2014 1152   BILITOT 2.70* 06/08/2014 1218   BILITOT 1.1 01/24/2014 1152         Impression and Plan: Anna Benjamin is 78 year old white female with metastatic breast cancer. She has been through all the treatment that she can take. Again, our focus now is just quality-of-life. Hospice will help out of immensely. She has a son who is living with her so he can help out.  She has worsening hepatic disease. Her liver test and her renal function looks about the same which is a blessing.  I really don't think that she is looking at more than 6-8 weeks. I want to try to make this as comfortable for as possible. I suspect that she probably will end up in [facility].  Hopefully, the IV albumin and IV Lasix may help with the swelling in her legs.  I want to see her back in 2 weeks.  I spent about 30 minutes with her today.   Volanda Napoleon, MD 12/30/20156:26 PM

## 2014-06-09 ENCOUNTER — Other Ambulatory Visit: Payer: Self-pay | Admitting: Nurse Practitioner

## 2014-06-09 DIAGNOSIS — C50911 Malignant neoplasm of unspecified site of right female breast: Secondary | ICD-10-CM

## 2014-06-13 ENCOUNTER — Other Ambulatory Visit: Payer: Self-pay | Admitting: *Deleted

## 2014-06-13 DIAGNOSIS — C50911 Malignant neoplasm of unspecified site of right female breast: Secondary | ICD-10-CM

## 2014-06-13 MED ORDER — NEOMYCIN-POLYMYXIN-HC 3.5-10000-1 OP SUSP: 2.0000 [drp] | Freq: Four times a day (QID) | OPHTHALMIC | Status: DC

## 2014-06-17 ENCOUNTER — Ambulatory Visit: Payer: Medicare Other

## 2014-06-22 ENCOUNTER — Other Ambulatory Visit: Payer: Medicare Other | Admitting: *Deleted

## 2014-06-22 ENCOUNTER — Other Ambulatory Visit (HOSPITAL_BASED_OUTPATIENT_CLINIC_OR_DEPARTMENT_OTHER): Admitting: Lab

## 2014-06-22 ENCOUNTER — Ambulatory Visit (HOSPITAL_BASED_OUTPATIENT_CLINIC_OR_DEPARTMENT_OTHER): Admitting: Hematology & Oncology

## 2014-06-22 ENCOUNTER — Encounter: Payer: Self-pay | Admitting: Hematology & Oncology

## 2014-06-22 VITALS — BP 129/72 | HR 101 | Temp 97.6°F | Resp 16 | Ht 60.0 in | Wt 130.0 lb

## 2014-06-22 DIAGNOSIS — C50912 Malignant neoplasm of unspecified site of left female breast: Secondary | ICD-10-CM

## 2014-06-22 DIAGNOSIS — C7951 Secondary malignant neoplasm of bone: Secondary | ICD-10-CM | POA: Diagnosis not present

## 2014-06-22 DIAGNOSIS — R7989 Other specified abnormal findings of blood chemistry: Secondary | ICD-10-CM

## 2014-06-22 DIAGNOSIS — H578 Other specified disorders of eye and adnexa: Secondary | ICD-10-CM | POA: Diagnosis not present

## 2014-06-22 DIAGNOSIS — C787 Secondary malignant neoplasm of liver and intrahepatic bile duct: Secondary | ICD-10-CM

## 2014-06-22 DIAGNOSIS — H5789 Other specified disorders of eye and adnexa: Secondary | ICD-10-CM

## 2014-06-22 LAB — CBC WITH DIFFERENTIAL (CANCER CENTER ONLY)
BASO#: 0 10*3/uL (ref 0.0–0.2)
BASO%: 0.3 % (ref 0.0–2.0)
EOS%: 0.2 % (ref 0.0–7.0)
Eosinophils Absolute: 0 10*3/uL (ref 0.0–0.5)
HCT: 36.4 % (ref 34.8–46.6)
HGB: 11.8 g/dL (ref 11.6–15.9)
LYMPH#: 2 10*3/uL (ref 0.9–3.3)
LYMPH%: 16.2 % (ref 14.0–48.0)
MCH: 35.9 pg — ABNORMAL HIGH (ref 26.0–34.0)
MCHC: 32.4 g/dL (ref 32.0–36.0)
MCV: 111 fL — ABNORMAL HIGH (ref 81–101)
MONO#: 1.5 10*3/uL — ABNORMAL HIGH (ref 0.1–0.9)
MONO%: 11.9 % (ref 0.0–13.0)
NEUT%: 71.4 % (ref 39.6–80.0)
NEUTROS ABS: 8.8 10*3/uL — AB (ref 1.5–6.5)
Platelets: 64 10*3/uL — ABNORMAL LOW (ref 145–400)
RBC: 3.29 10*6/uL — ABNORMAL LOW (ref 3.70–5.32)
RDW: 19.9 % — AB (ref 11.1–15.7)
WBC: 12.3 10*3/uL — ABNORMAL HIGH (ref 3.9–10.0)

## 2014-06-22 LAB — CMP (CANCER CENTER ONLY)
ALT: 36 U/L (ref 10–47)
AST: 158 U/L — ABNORMAL HIGH (ref 11–38)
Albumin: 2.9 g/dL — ABNORMAL LOW (ref 3.3–5.5)
Alkaline Phosphatase: 809 U/L — ABNORMAL HIGH (ref 26–84)
BILIRUBIN TOTAL: 4.2 mg/dL — AB (ref 0.20–1.60)
BUN: 22 mg/dL (ref 7–22)
CALCIUM: 11.2 mg/dL — AB (ref 8.0–10.3)
CHLORIDE: 98 meq/L (ref 98–108)
CO2: 25 mEq/L (ref 18–33)
CREATININE: 1 mg/dL (ref 0.6–1.2)
Glucose, Bld: 90 mg/dL (ref 73–118)
Potassium: 3.8 mEq/L (ref 3.3–4.7)
Sodium: 132 mEq/L (ref 128–145)
Total Protein: 5 g/dL — ABNORMAL LOW (ref 6.4–8.1)

## 2014-06-22 LAB — PREALBUMIN: Prealbumin: 6.2 mg/dL — ABNORMAL LOW (ref 17.0–34.0)

## 2014-06-22 MED ORDER — EMEDASTINE DIFUMARATE 0.05 % OP SOLN: 1.0000 [drp] | Freq: Four times a day (QID) | OPHTHALMIC | Status: AC

## 2014-06-22 MED ORDER — OXYCODONE-ACETAMINOPHEN 5-325 MG PO TABS
1.0000 | ORAL_TABLET | Freq: Once | ORAL | Status: AC
  Administered 2014-06-22: 1 via ORAL

## 2014-06-22 MED ORDER — OXYCODONE-ACETAMINOPHEN 5-325 MG PO TABS
ORAL_TABLET | ORAL | Status: AC
Start: 2014-06-22 — End: 2014-06-22
  Filled 2014-06-22: qty 1

## 2014-06-22 NOTE — Progress Notes (Signed)
Hematology and Oncology Follow Up Visit  Jamelyn Bovard [id number] November 17, 1935 79 y.o. 06/22/2014   Principle Diagnosis:   Metastatic breast cancer-progressive (ER+/HER2 -)  Current Therapy:   Supportive care with hospice    Interim History:  Ms.  Koepke is back for follow-up. She now is being followed by hospice. Her decline continues. Her appetite is poor. She still has a lot of swelling in the legs. I don't think anything will help this as her liver becomes more involved.  She's having more in the way of pain. She only is taking Norco once or twice a day. I told her she take up to 2 pills every 4 hours if necessary.  Hospice is doing a good job with her. They're going to bring a bed out to her house this week.  Her son is also doing a great job.  She is not bleeding. She is not having diarrhea. She's having no headache. There is no cough or shortness of breath.  I would say that her performance status is ECOG 3  Medications:  Current outpatient prescriptions:  .  Calcium Carbonate-Vit D-Min (CALCIUM 1200 PO), Take 1 tablet by mouth every morning. , Disp: , Rfl:  .  fluticasone (FLONASE) 50 MCG/ACT nasal spray, Place 2 sprays into the nose daily as needed for allergies or rhinitis. , Disp: , Rfl:  .  HYDROcodone-acetaminophen (NORCO) 10-325 MG per tablet, Take 1-2 tablets by mouth every 6 (six) hours as needed for moderate pain., Disp: 120 tablet, Rfl: 0 .  lidocaine-prilocaine (EMLA) cream, Apply to affected area once, Disp: 30 g, Rfl: 3 .  LORazepam (ATIVAN) 0.5 MG tablet, Take 1 tablet (0.5 mg total) by mouth every 6 (six) hours as needed (Nausea or vomiting)., Disp: 30 tablet, Rfl: 0 .  omeprazole (PRILOSEC) 20 MG capsule, Take 20 mg by mouth daily as needed (for acid reflex)., Disp: , Rfl:  .  prochlorperazine (COMPAZINE) 10 MG tablet, Take 1 tablet (10 mg total) by mouth every 6 (six) hours as needed (Nausea or vomiting)., Disp: 30 tablet, Rfl: 1 .  traZODone (DESYREL) 50  MG tablet, TAKE 2 TABLETS BY MOUTH EVERY NIGHT AT BEDTIME AS NEEDED FOR SLEEP, Disp: 60 tablet, Rfl: 0 .  Emedastine Difumarate (EMADINE) 0.05 % SOLN, Apply 1 drop to eye 4 (four) times daily., Disp: 5 mL, Rfl: 0  Allergies: No Known Allergies  Past Medical History, Surgical history, Social history, and Family History were reviewed and updated.  Review of Systems: As above  Physical Exam:  height is 5' (1.524 m) and weight is 130 lb (58.968 kg). Her oral temperature is 97.6 F (36.4 C). Her blood pressure is 129/72 and her pulse is 101. Her respiration is 16.   Petit white female in no obvious distress. Head and neck exam shows no ocular or oral lesions. She has no palpable cervical or supraclavicular lymph nodes. Lungs are clear. Cardiac exam regular rate and rhythm with no murmurs, rubs or bruits. Abdomen is soft. She has good bowel sounds. There is no fluid wave. There is no palpable liver or spleen tip. Back exam shows no tenderness over the spine, ribs or hips. Extremities shows 3+ edema in her legs. This is pitting edema. She has decent range of motion of her joints. She has good strength. Skin exam shows no rashes, ecchymoses or petechia. Neurological exam is nonfocal.  Lab Results  Component Value Date   WBC 12.3* 06/22/2014   HGB 11.8 06/22/2014   HCT 36.4 06/22/2014  MCV 111* 06/22/2014   PLT 64* 06/22/2014     Chemistry      Component Value Date/Time   NA 132 06/22/2014 1343   NA 137 01/24/2014 1152   K 3.8 06/22/2014 1343   K 3.7 01/24/2014 1152   CL 98 06/22/2014 1343   CL 103 01/24/2014 1152   CO2 25 06/22/2014 1343   CO2 22 01/24/2014 1152   BUN 22 06/22/2014 1343   BUN 21 01/24/2014 1152   CREATININE 1.0 06/22/2014 1343   CREATININE 0.83 01/24/2014 1152      Component Value Date/Time   CALCIUM 11.2* 06/22/2014 1343   CALCIUM 9.7 01/24/2014 1152   ALKPHOS 809* 06/22/2014 1343   ALKPHOS 378* 01/24/2014 1152   AST 158* 06/22/2014 1343   AST 72* 01/24/2014  1152   ALT 36 06/22/2014 1343   ALT 27 01/24/2014 1152   BILITOT 4.20* 06/22/2014 1343   BILITOT 1.1 01/24/2014 1152         Impression and Plan: Ms. Hausmann is 79 year old white female with metastatic breast cancer. She has been through all the treatment that she can take.   Of note, her calcium is starting to go up. I am not going to treat this as I'll think it would impact her quality of life.  Her liver tests continue to worsen. Her bilirubin is going up.  I told her that because of her liver involvement by cancer, she is going to have the swelling in her legs. There is not much that we can do about this. Diuretics will not help.  I would be apprised if she makes it more than a month. I think that she is probably going to and up into can place within 2 weeks.  We will see her back in 2 weeks. I think we will see a marked change in her condition. It is certainly conceivable that she is not even going to make that appointment.  Again, I'll will be very aggressive with getting hospice involved.  I spent about 30 minutes with her today.   Volanda Napoleon, MD 1/13/20166:03 PM

## 2014-07-01 ENCOUNTER — Ambulatory Visit: Payer: Medicare Other

## 2014-07-01 ENCOUNTER — Ambulatory Visit: Payer: Medicare Other | Admitting: Family

## 2014-07-01 ENCOUNTER — Other Ambulatory Visit: Payer: Medicare Other | Admitting: Lab

## 2014-07-08 ENCOUNTER — Telehealth: Payer: Self-pay | Admitting: Hematology & Oncology

## 2014-07-08 ENCOUNTER — Other Ambulatory Visit: Payer: Medicare Other | Admitting: Lab

## 2014-07-08 ENCOUNTER — Ambulatory Visit: Payer: Self-pay | Admitting: Hematology & Oncology

## 2014-07-08 ENCOUNTER — Ambulatory Visit: Payer: Self-pay

## 2014-07-08 NOTE — Telephone Encounter (Signed)
Per MD don't call pt to reschedule, they will call us if needed

## 2014-07-18 ENCOUNTER — Other Ambulatory Visit: Payer: Self-pay | Admitting: Family

## 2014-07-20 NOTE — Progress Notes (Signed)
Received death certificate stating pt passed away on 2014/07/28. Dr Marin Olp notified. dph

## 2014-08-09 DEATH — deceased

## 2016-05-07 IMAGING — US US EXTREM  UP VENOUS*L*
1 series · 14 of 24 positions shown · non-contrast
Comparison: None.

CLINICAL DATA: Left arm edema.  Breast cancer.

EXAM:
LEFT UPPER EXTREMITY VENOUS DUPLEX ULTRASOUND
TECHNIQUE: Doppler venous assessment of the left upper extremity deep venous
system was performed, including characterization of spectral flow,
compressibility, and phasicity.

[Series 1: us extrem up venous*left* · 0.07mm/px · 14 of 25 slices shown]
[im 1/25]
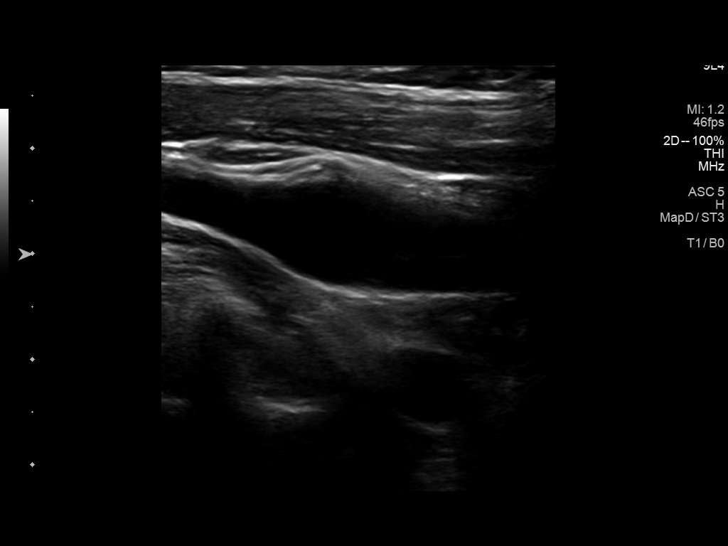
[im 3/25]
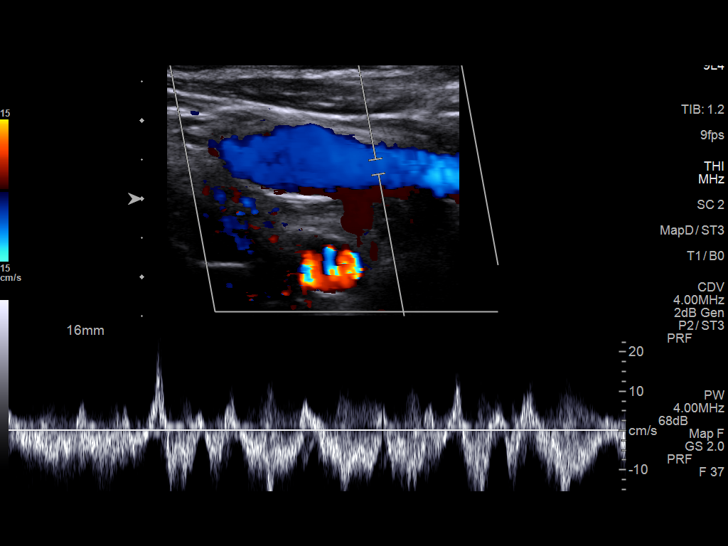
[im 5/25]
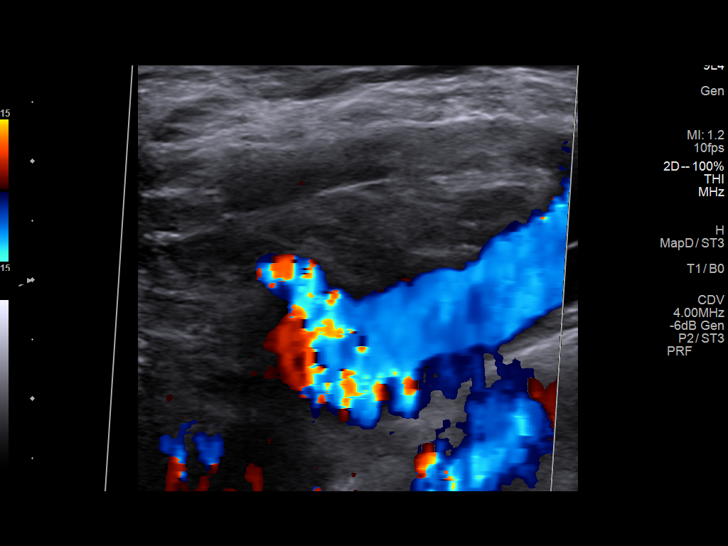
[im 7/25]
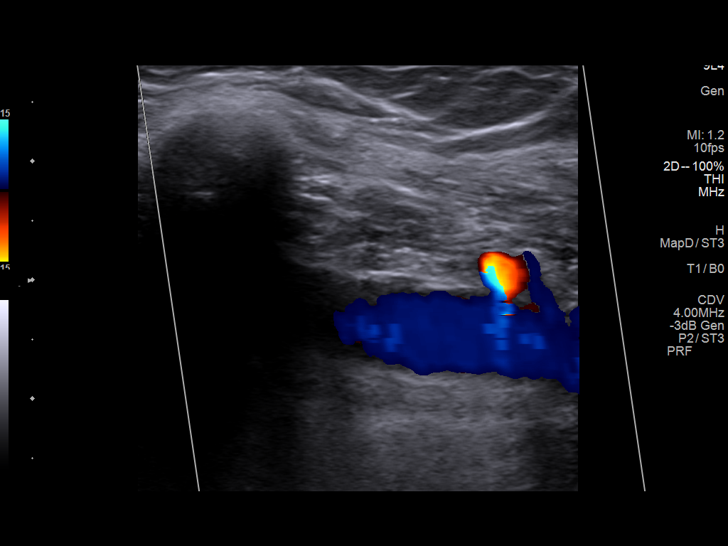
[im 8/25]
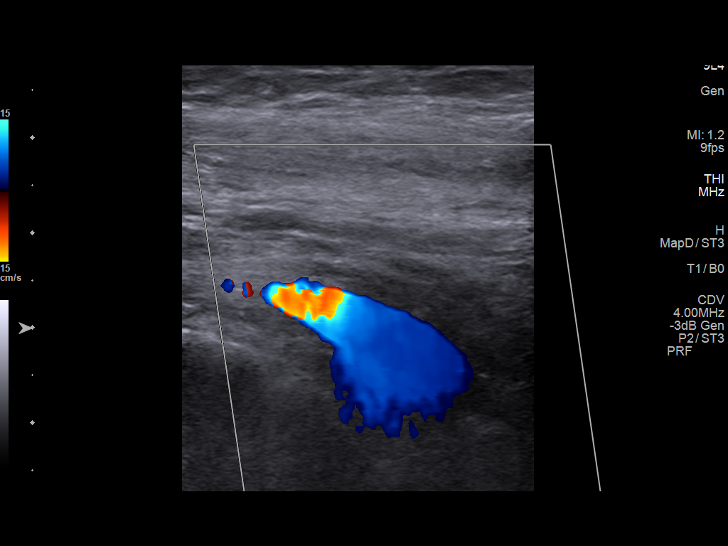
[im 10/25]
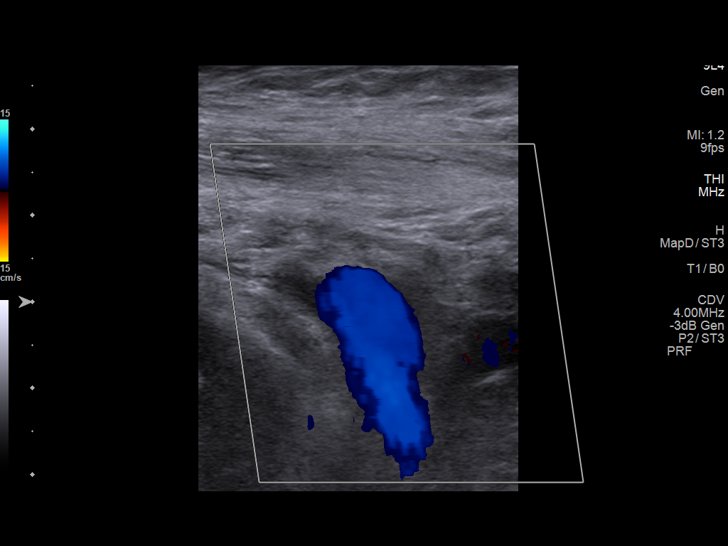
[im 12/25]
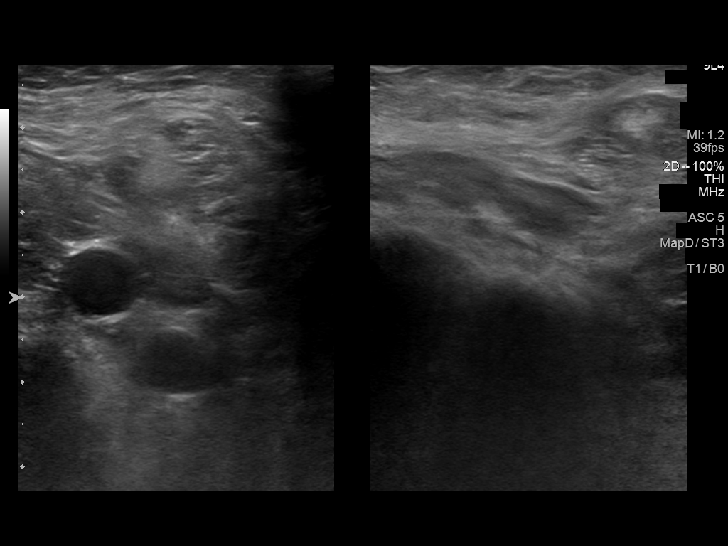
[im 13/25]
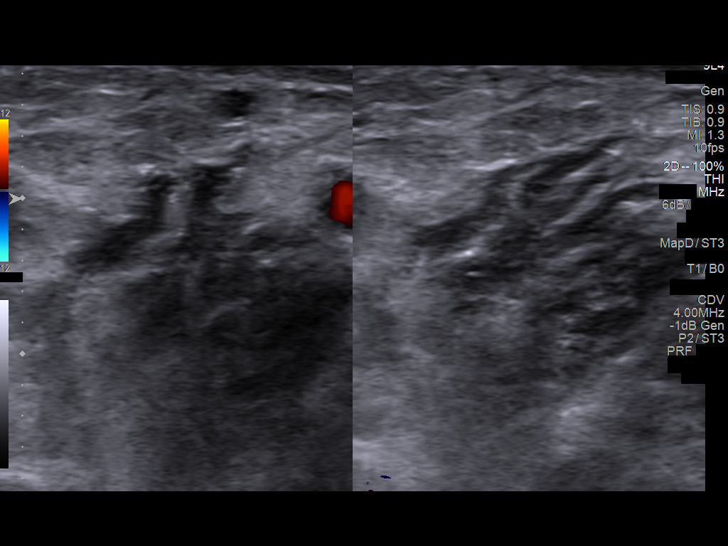
[im 15/25]
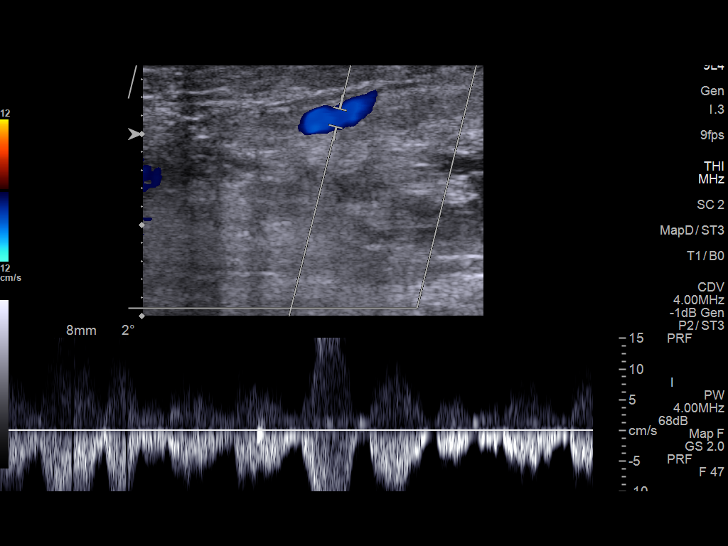
[im 17/25]
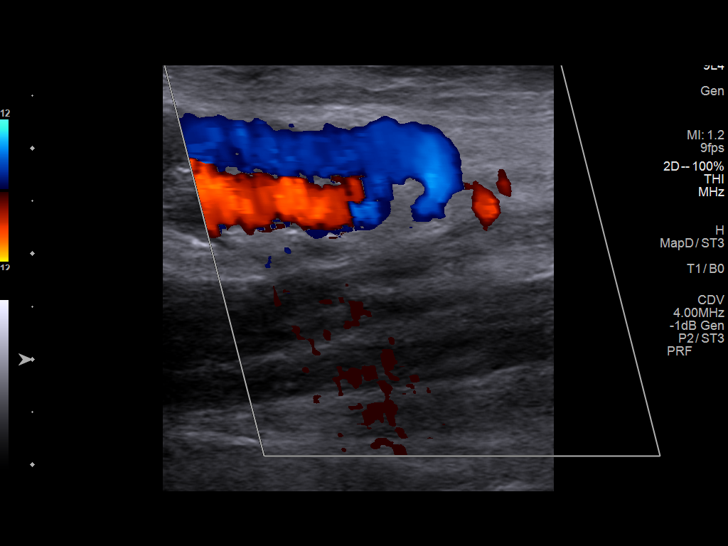
[im 19/25]
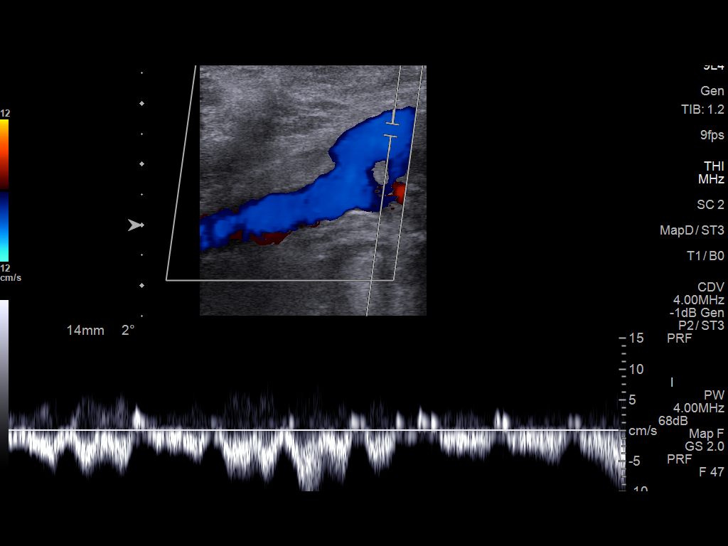
[im 20/25]
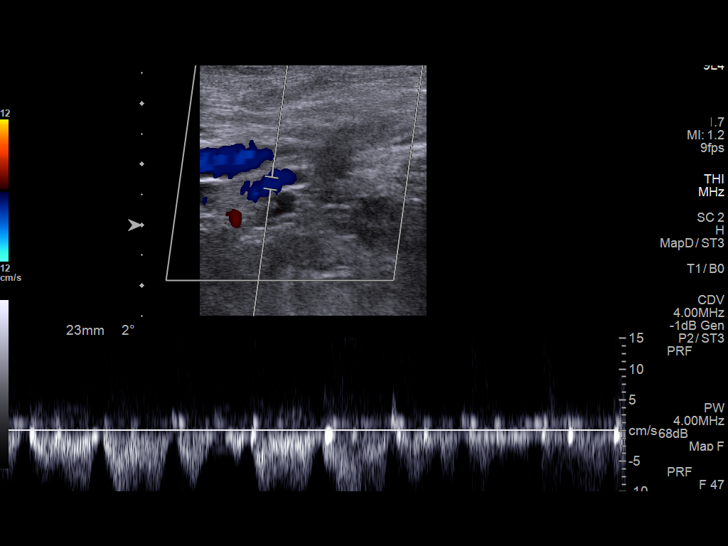
[im 22/25]
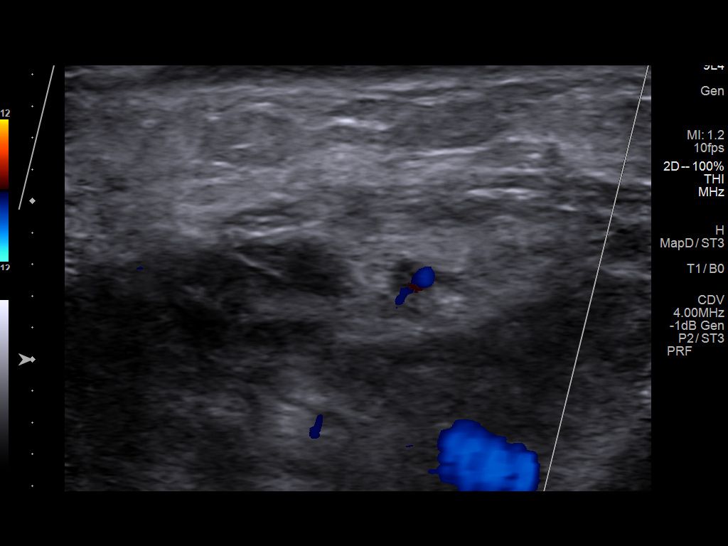
[im 25/25]
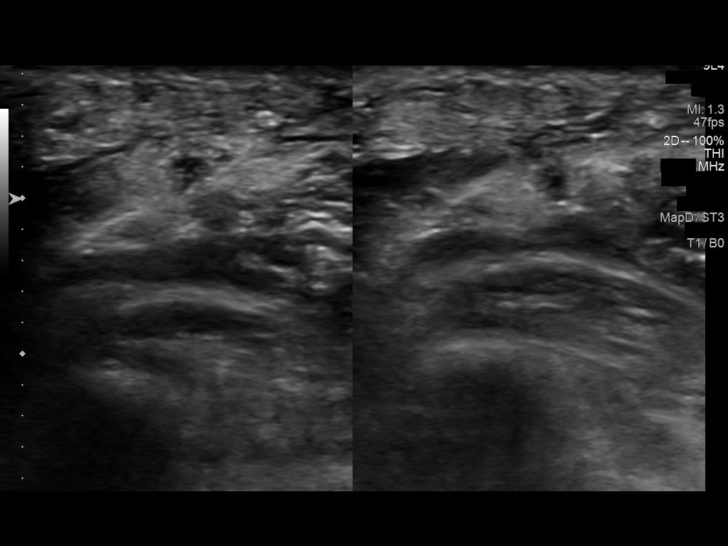

[14 of 24 positions shown; findings below may reference images not displayed]

FINDINGS: Left internal jugular and subclavian veins are widely patent by
color flow imaging. Doppler analysis demonstrates a normal-appearing
venous waveform.

Left axillary vein, basilic vein, brachial vein, and radial vein are
compressible. Doppler analysis demonstrates normal-appearing venous
waveforms. There is augmentation of flow upon wrist compression.

The ulnar vein is grossly compressible but was poorly visualized.
The cephalic vein was nonvisualized.
IMPRESSION: No evidence of left upper extremity DVT.
